# Patient Record
Sex: Female | Born: 1945 | Race: White | Hispanic: No | Smoking: Never smoker
Health system: Southern US, Community
[De-identification: ages and names within clinical notes are randomized; demographics above are authoritative.]

## PROBLEM LIST (undated history)

## (undated) DIAGNOSIS — N289 Disorder of kidney and ureter, unspecified: Secondary | ICD-10-CM

## (undated) DIAGNOSIS — K635 Polyp of colon: Secondary | ICD-10-CM

## (undated) DIAGNOSIS — E785 Hyperlipidemia, unspecified: Secondary | ICD-10-CM

## (undated) DIAGNOSIS — I10 Essential (primary) hypertension: Secondary | ICD-10-CM

## (undated) DIAGNOSIS — C679 Malignant neoplasm of bladder, unspecified: Secondary | ICD-10-CM

## (undated) DIAGNOSIS — C801 Malignant (primary) neoplasm, unspecified: Secondary | ICD-10-CM

## (undated) DIAGNOSIS — C539 Malignant neoplasm of cervix uteri, unspecified: Secondary | ICD-10-CM

## (undated) DIAGNOSIS — F419 Anxiety disorder, unspecified: Secondary | ICD-10-CM

## (undated) HISTORY — DX: Malignant neoplasm of cervix uteri, unspecified: C53.9

## (undated) HISTORY — PX: OTHER SURGICAL HISTORY: SHX169

## (undated) HISTORY — DX: Malignant (primary) neoplasm, unspecified: C80.1

## (undated) HISTORY — DX: Hyperlipidemia, unspecified: E78.5

## (undated) HISTORY — PX: CHOLECYSTECTOMY: SHX55

## (undated) HISTORY — DX: Anxiety disorder, unspecified: F41.9

## (undated) HISTORY — DX: Polyp of colon: K63.5

## (undated) HISTORY — DX: Essential (primary) hypertension: I10

## (undated) HISTORY — DX: Malignant neoplasm of bladder, unspecified: C67.9

## (undated) HISTORY — PX: ABDOMINAL HYSTERECTOMY: SHX81

---

## 1969-04-03 DIAGNOSIS — C801 Malignant (primary) neoplasm, unspecified: Secondary | ICD-10-CM

## 1969-04-03 HISTORY — DX: Malignant (primary) neoplasm, unspecified: C80.1

## 1983-04-04 HISTORY — PX: CHOLECYSTECTOMY: SHX55

## 1984-04-03 DIAGNOSIS — C679 Malignant neoplasm of bladder, unspecified: Secondary | ICD-10-CM

## 1984-04-03 HISTORY — DX: Malignant neoplasm of bladder, unspecified: C67.9

## 1984-04-03 HISTORY — PX: TOTAL ABDOMINAL HYSTERECTOMY: SHX209

## 1984-04-03 HISTORY — PX: APPENDECTOMY: SHX54

## 2010-08-19 ENCOUNTER — Encounter: Payer: Self-pay | Admitting: Nurse Practitioner

## 2011-08-08 DIAGNOSIS — D1800 Hemangioma unspecified site: Secondary | ICD-10-CM | POA: Diagnosis not present

## 2011-08-08 DIAGNOSIS — L820 Inflamed seborrheic keratosis: Secondary | ICD-10-CM | POA: Diagnosis not present

## 2011-08-08 DIAGNOSIS — L570 Actinic keratosis: Secondary | ICD-10-CM | POA: Diagnosis not present

## 2011-08-08 DIAGNOSIS — L821 Other seborrheic keratosis: Secondary | ICD-10-CM | POA: Diagnosis not present

## 2011-08-08 DIAGNOSIS — D485 Neoplasm of uncertain behavior of skin: Secondary | ICD-10-CM | POA: Diagnosis not present

## 2011-08-11 DIAGNOSIS — I10 Essential (primary) hypertension: Secondary | ICD-10-CM | POA: Diagnosis not present

## 2011-08-21 DIAGNOSIS — Z8544 Personal history of malignant neoplasm of other female genital organs: Secondary | ICD-10-CM | POA: Diagnosis not present

## 2011-08-21 DIAGNOSIS — Z09 Encounter for follow-up examination after completed treatment for conditions other than malignant neoplasm: Secondary | ICD-10-CM | POA: Diagnosis not present

## 2011-08-21 DIAGNOSIS — C52 Malignant neoplasm of vagina: Secondary | ICD-10-CM | POA: Diagnosis not present

## 2011-09-06 DIAGNOSIS — M949 Disorder of cartilage, unspecified: Secondary | ICD-10-CM | POA: Diagnosis not present

## 2011-09-06 DIAGNOSIS — M899 Disorder of bone, unspecified: Secondary | ICD-10-CM | POA: Diagnosis not present

## 2011-12-27 DIAGNOSIS — Z1231 Encounter for screening mammogram for malignant neoplasm of breast: Secondary | ICD-10-CM | POA: Diagnosis not present

## 2012-01-08 DIAGNOSIS — Z09 Encounter for follow-up examination after completed treatment for conditions other than malignant neoplasm: Secondary | ICD-10-CM | POA: Diagnosis not present

## 2012-01-08 DIAGNOSIS — Z86010 Personal history of colonic polyps: Secondary | ICD-10-CM | POA: Diagnosis not present

## 2012-01-08 DIAGNOSIS — Z8544 Personal history of malignant neoplasm of other female genital organs: Secondary | ICD-10-CM | POA: Diagnosis not present

## 2012-01-08 DIAGNOSIS — N829 Female genital tract fistula, unspecified: Secondary | ICD-10-CM | POA: Diagnosis not present

## 2012-01-11 DIAGNOSIS — Q519 Congenital malformation of uterus and cervix, unspecified: Secondary | ICD-10-CM | POA: Diagnosis not present

## 2012-01-11 DIAGNOSIS — C52 Malignant neoplasm of vagina: Secondary | ICD-10-CM | POA: Diagnosis not present

## 2012-01-11 DIAGNOSIS — N824 Other female intestinal-genital tract fistulae: Secondary | ICD-10-CM | POA: Diagnosis not present

## 2012-01-26 DIAGNOSIS — N824 Other female intestinal-genital tract fistulae: Secondary | ICD-10-CM | POA: Diagnosis not present

## 2012-01-26 DIAGNOSIS — C52 Malignant neoplasm of vagina: Secondary | ICD-10-CM | POA: Diagnosis not present

## 2012-01-28 DIAGNOSIS — C52 Malignant neoplasm of vagina: Secondary | ICD-10-CM | POA: Insufficient documentation

## 2012-01-28 DIAGNOSIS — N823 Fistula of vagina to large intestine: Secondary | ICD-10-CM | POA: Insufficient documentation

## 2012-02-01 DIAGNOSIS — N824 Other female intestinal-genital tract fistulae: Secondary | ICD-10-CM | POA: Diagnosis not present

## 2012-02-01 DIAGNOSIS — Z01818 Encounter for other preprocedural examination: Secondary | ICD-10-CM | POA: Diagnosis not present

## 2012-02-01 DIAGNOSIS — Z8544 Personal history of malignant neoplasm of other female genital organs: Secondary | ICD-10-CM | POA: Diagnosis not present

## 2012-02-01 DIAGNOSIS — K660 Peritoneal adhesions (postprocedural) (postinfection): Secondary | ICD-10-CM | POA: Diagnosis not present

## 2012-02-01 DIAGNOSIS — Z8249 Family history of ischemic heart disease and other diseases of the circulatory system: Secondary | ICD-10-CM | POA: Diagnosis not present

## 2012-02-01 DIAGNOSIS — R159 Full incontinence of feces: Secondary | ICD-10-CM | POA: Diagnosis not present

## 2012-02-01 DIAGNOSIS — Z91013 Allergy to seafood: Secondary | ICD-10-CM | POA: Diagnosis not present

## 2012-02-01 DIAGNOSIS — Z833 Family history of diabetes mellitus: Secondary | ICD-10-CM | POA: Diagnosis not present

## 2012-02-01 DIAGNOSIS — I10 Essential (primary) hypertension: Secondary | ICD-10-CM | POA: Diagnosis not present

## 2012-02-01 DIAGNOSIS — Z79899 Other long term (current) drug therapy: Secondary | ICD-10-CM | POA: Diagnosis not present

## 2012-02-01 DIAGNOSIS — Z8489 Family history of other specified conditions: Secondary | ICD-10-CM | POA: Diagnosis not present

## 2012-02-01 DIAGNOSIS — Z91041 Radiographic dye allergy status: Secondary | ICD-10-CM | POA: Diagnosis not present

## 2012-02-01 DIAGNOSIS — Z823 Family history of stroke: Secondary | ICD-10-CM | POA: Diagnosis not present

## 2012-02-20 DIAGNOSIS — Z433 Encounter for attention to colostomy: Secondary | ICD-10-CM | POA: Diagnosis not present

## 2012-03-04 DIAGNOSIS — N829 Female genital tract fistula, unspecified: Secondary | ICD-10-CM | POA: Diagnosis not present

## 2012-03-04 DIAGNOSIS — Z8544 Personal history of malignant neoplasm of other female genital organs: Secondary | ICD-10-CM | POA: Diagnosis not present

## 2012-03-04 DIAGNOSIS — Z09 Encounter for follow-up examination after completed treatment for conditions other than malignant neoplasm: Secondary | ICD-10-CM | POA: Diagnosis not present

## 2012-03-06 DIAGNOSIS — Z433 Encounter for attention to colostomy: Secondary | ICD-10-CM | POA: Diagnosis not present

## 2012-03-14 DIAGNOSIS — Z23 Encounter for immunization: Secondary | ICD-10-CM | POA: Diagnosis not present

## 2012-04-05 DIAGNOSIS — E785 Hyperlipidemia, unspecified: Secondary | ICD-10-CM | POA: Diagnosis not present

## 2012-04-05 DIAGNOSIS — E559 Vitamin D deficiency, unspecified: Secondary | ICD-10-CM | POA: Diagnosis not present

## 2012-04-05 DIAGNOSIS — I10 Essential (primary) hypertension: Secondary | ICD-10-CM | POA: Diagnosis not present

## 2012-04-10 DIAGNOSIS — Z433 Encounter for attention to colostomy: Secondary | ICD-10-CM | POA: Diagnosis not present

## 2012-05-13 DIAGNOSIS — C52 Malignant neoplasm of vagina: Secondary | ICD-10-CM | POA: Diagnosis not present

## 2012-05-13 DIAGNOSIS — N824 Other female intestinal-genital tract fistulae: Secondary | ICD-10-CM | POA: Diagnosis not present

## 2012-05-15 DIAGNOSIS — H43399 Other vitreous opacities, unspecified eye: Secondary | ICD-10-CM | POA: Diagnosis not present

## 2012-05-22 DIAGNOSIS — Z09 Encounter for follow-up examination after completed treatment for conditions other than malignant neoplasm: Secondary | ICD-10-CM | POA: Diagnosis not present

## 2012-05-22 DIAGNOSIS — Z8544 Personal history of malignant neoplasm of other female genital organs: Secondary | ICD-10-CM | POA: Diagnosis not present

## 2012-05-22 DIAGNOSIS — Z433 Encounter for attention to colostomy: Secondary | ICD-10-CM | POA: Diagnosis not present

## 2012-07-08 ENCOUNTER — Ambulatory Visit (INDEPENDENT_AMBULATORY_CARE_PROVIDER_SITE_OTHER): Payer: Medicare Other | Admitting: Nurse Practitioner

## 2012-07-08 ENCOUNTER — Encounter: Payer: Self-pay | Admitting: Nurse Practitioner

## 2012-07-08 VITALS — BP 128/78 | HR 68 | Temp 96.8°F | Ht 64.5 in | Wt 140.5 lb

## 2012-07-08 DIAGNOSIS — E785 Hyperlipidemia, unspecified: Secondary | ICD-10-CM | POA: Insufficient documentation

## 2012-07-08 DIAGNOSIS — I10 Essential (primary) hypertension: Secondary | ICD-10-CM | POA: Insufficient documentation

## 2012-07-08 LAB — COMPLETE METABOLIC PANEL WITH GFR
Alkaline Phosphatase: 75 U/L (ref 39–117)
Creat: 0.82 mg/dL (ref 0.50–1.10)
GFR, Est African American: 86 mL/min
GFR, Est Non African American: 74 mL/min
Glucose, Bld: 90 mg/dL (ref 70–99)
Sodium: 138 mEq/L (ref 135–145)
Total Bilirubin: 0.5 mg/dL (ref 0.3–1.2)
Total Protein: 7.8 g/dL (ref 6.0–8.3)

## 2012-07-08 MED ORDER — ENALAPRIL MALEATE 10 MG PO TABS: 10.0000 mg | ORAL_TABLET | Freq: Every day | ORAL | Status: DC

## 2012-07-08 NOTE — Patient Instructions (Signed)

## 2012-07-08 NOTE — Progress Notes (Signed)
Subjective:     Sheri Russell is a 67 y.o. female.  Chief Complaint: Dyslipidemia Patient presents for evaluation of lipids. Compliance with treatment thus far has been inadequate. Patients last labs were good so patient stopped taking zocor. A repeat fasting lipid profile was done. The patient does not use medications that may worsen dyslipidemias (corticosteroids, progestins, anabolic steroids, diuretics, beta-blockers, amiodarone, cyclosporine, olanzapine). The patient exercises daily.  The patient is known to have coexisting coronary artery disease.   Cardiac Risk Factors Age > 45-female, > 55-female:  YES  +1  Smoking:   NO  Sig. family hx of CHD*:  YES  +1  Hypertension:   YES  +1  Diabetes:   NO  HDL < 35:   NO  HDL > 59:   YES  -1  Total: 2  *- Significant family history of Coronary Heart Disease per National Cholesterol Education Program = Myocardial Infarction or sudden death at less than 69 years old in  father or other 1st-degree female relative, or less than 45 years old in mother or  other 1st-degree female relative.  Hypertension Patient is here for follow-up of elevated blood pressure. She is exercising and is adherent to a low-salt diet. Blood pressure is well controlled at home. Cardiac symptoms: none. Patient denies chest pain, dyspnea, fatigue, near-syncope and palpitations. Cardiovascular risk factors: advanced age (older than 65 for men, 26 for women). Use of agents associated with hypertension: none. History of target organ damage: none.   The following portions of the patient's history were reviewed and updated as appropriate: allergies, current medications, past family history, past medical history, past social history, past surgical history and problem list.  Review of Systems A comprehensive review of systems was negative.    Objective:    BP 128/78  Pulse 68  Temp(Src) 96.8 F (36 C) (Oral)  Ht 5' 4.5" (1.638 m)  Wt 140 lb 8 oz (63.73 kg)  BMI 23.75 kg/m2   LMP 07/09/1975 BP 128/78  Pulse 68  Temp(Src) 96.8 F (36 C) (Oral)  Ht 5' 4.5" (1.638 m)  Wt 140 lb 8 oz (63.73 kg)  BMI 23.75 kg/m2  LMP 07/09/1975  General Appearance:    Alert, cooperative, no distress, appears stated age  Head:    Normocephalic, without obvious abnormality, atraumatic  Eyes:    PERRL, conjunctiva/corneas clear, EOM's intact, fundi    benign, both eyes  Ears:    Normal TM's and external ear canals, both ears  Nose:   Nares normal, septum midline, mucosa normal, no drainage    or sinus tenderness  Throat:   Lips, mucosa, and tongue normal; teeth and gums normal  Neck:   Supple, symmetrical, trachea midline, no adenopathy;    thyroid:  no enlargement/tenderness/nodules; no carotid   bruit or JVD  Back:     Symmetric, no curvature, ROM normal, no CVA tenderness  Lungs:     Clear to auscultation bilaterally, respirations unlabored  Chest Wall:    No tenderness or deformity   Heart:    Regular rate and rhythm, S1 and S2 normal, no murmur, rub   or gallop  Breast Exam:    No tenderness, masses, or nipple abnormality  Abdomen:     Soft, non-tender, bowel sounds active all four quadrants,    no masses, no organomegaly  Genitalia:    Normal female without lesion, discharge or tenderness  Rectal:    Normal tone, normal prostate, no masses or tenderness;   guaiac negative  stool  Extremities:   Extremities normal, atraumatic, no cyanosis or edema  Pulses:   2+ and symmetric all extremities  Skin:   Skin color, texture, turgor normal, no rashes or lesions  Lymph nodes:   Cervical, supraclavicular, and axillary nodes normal  Neurologic:   CNII-XII intact, normal strength, sensation and reflexes    throughout      Assessment:     1. Essential hypertension, benign   2. Hyperlipidemia with target LDL less than 100         Plan:     Low fat diet and exercise Labs pending Continue current med Will discuss Cholesterol and wether need to go back on Zocor F/U in 3  months  Mary-Margaret Daphine Deutscher, FNP

## 2012-07-09 ENCOUNTER — Telehealth: Payer: Self-pay | Admitting: Nurse Practitioner

## 2012-07-09 ENCOUNTER — Telehealth: Payer: Self-pay | Admitting: *Deleted

## 2012-07-09 LAB — NMR LIPOPROFILE WITH LIPIDS
HDL Size: 9.4 nm (ref >=9.2)
HDL-C: 67 mg/dL (ref >=40)
LDL (calc): 95 mg/dL (ref <100)
LDL Particle Number: 1094 nmol/L — ABNORMAL HIGH (ref <1000)
LDL Size: 20.7 nm (ref >20.5)
LP-IR Score: 49 — ABNORMAL HIGH (ref <=45)
Large HDL-P: 10.6 umol/L (ref >=4.8)
Triglycerides: 152 mg/dL — ABNORMAL HIGH (ref <150)
VLDL Size: 49.2 nm — ABNORMAL HIGH (ref <=46.6)

## 2012-07-09 NOTE — Telephone Encounter (Signed)
Message copied by Magdalene River on Tue Jul 09, 2012  2:08 PM ------      Message from: Bennie Pierini      Created: Tue Jul 09, 2012  1:43 PM       All labs look good. Continue current meds and recheck in 3-6 months ------

## 2012-07-10 NOTE — Telephone Encounter (Signed)
Pt was calling about lab results and pt was notified

## 2012-08-07 DIAGNOSIS — D485 Neoplasm of uncertain behavior of skin: Secondary | ICD-10-CM | POA: Diagnosis not present

## 2012-08-07 DIAGNOSIS — D1800 Hemangioma unspecified site: Secondary | ICD-10-CM | POA: Diagnosis not present

## 2012-08-07 DIAGNOSIS — D235 Other benign neoplasm of skin of trunk: Secondary | ICD-10-CM | POA: Diagnosis not present

## 2012-08-07 DIAGNOSIS — L570 Actinic keratosis: Secondary | ICD-10-CM | POA: Diagnosis not present

## 2012-08-07 DIAGNOSIS — L820 Inflamed seborrheic keratosis: Secondary | ICD-10-CM | POA: Diagnosis not present

## 2012-09-02 DIAGNOSIS — Z8544 Personal history of malignant neoplasm of other female genital organs: Secondary | ICD-10-CM | POA: Diagnosis not present

## 2012-09-02 DIAGNOSIS — Z09 Encounter for follow-up examination after completed treatment for conditions other than malignant neoplasm: Secondary | ICD-10-CM | POA: Diagnosis not present

## 2012-09-02 DIAGNOSIS — Z933 Colostomy status: Secondary | ICD-10-CM | POA: Diagnosis not present

## 2012-11-06 ENCOUNTER — Other Ambulatory Visit: Payer: Self-pay

## 2012-11-11 DIAGNOSIS — H2510 Age-related nuclear cataract, unspecified eye: Secondary | ICD-10-CM | POA: Diagnosis not present

## 2012-12-31 DIAGNOSIS — Z1231 Encounter for screening mammogram for malignant neoplasm of breast: Secondary | ICD-10-CM | POA: Diagnosis not present

## 2013-01-20 ENCOUNTER — Ambulatory Visit (INDEPENDENT_AMBULATORY_CARE_PROVIDER_SITE_OTHER): Payer: Medicare Other

## 2013-01-20 DIAGNOSIS — Z23 Encounter for immunization: Secondary | ICD-10-CM | POA: Diagnosis not present

## 2013-01-22 ENCOUNTER — Ambulatory Visit: Payer: Medicare Other

## 2013-04-18 ENCOUNTER — Encounter: Payer: Self-pay | Admitting: Nurse Practitioner

## 2013-04-18 ENCOUNTER — Ambulatory Visit (INDEPENDENT_AMBULATORY_CARE_PROVIDER_SITE_OTHER): Payer: Medicare Other | Admitting: Nurse Practitioner

## 2013-04-18 VITALS — BP 161/82 | HR 90 | Temp 97.0°F | Ht 64.5 in | Wt 144.0 lb

## 2013-04-18 DIAGNOSIS — I10 Essential (primary) hypertension: Secondary | ICD-10-CM | POA: Diagnosis not present

## 2013-04-18 DIAGNOSIS — E785 Hyperlipidemia, unspecified: Secondary | ICD-10-CM

## 2013-04-18 MED ORDER — ENALAPRIL MALEATE 20 MG PO TABS: 20.0000 mg | ORAL_TABLET | Freq: Every day | ORAL | Status: DC

## 2013-04-18 NOTE — Patient Instructions (Signed)
Hypertriglyceridemia  Diet for High blood levels of Triglycerides Most fats in food are triglycerides. Triglycerides in your blood are stored as fat in your body. High levels of triglycerides in your blood may put you at a greater risk for heart disease and stroke.  Normal triglyceride levels are less than 150 mg/dL. Borderline high levels are 150-199 mg/dl. High levels are 200 - 499 mg/dL, and very high triglyceride levels are greater than 500 mg/dL. The decision to treat high triglycerides is generally based on the level. For people with borderline or high triglyceride levels, treatment includes weight loss and exercise. Drugs are recommended for people with very high triglyceride levels. Many people who need treatment for high triglyceride levels have metabolic syndrome. This syndrome is a collection of disorders that often include: insulin resistance, high blood pressure, blood clotting problems, high cholesterol and triglycerides. TESTING PROCEDURE FOR TRIGLYCERIDES  You should not eat 4 hours before getting your triglycerides measured. The normal range of triglycerides is between 10 and 250 milligrams per deciliter (mg/dl). Some people may have extreme levels (1000 or above), but your triglyceride level may be too high if it is above 150 mg/dl, depending on what other risk factors you have for heart disease.  People with high blood triglycerides may also have high blood cholesterol levels. If you have high blood cholesterol as well as high blood triglycerides, your risk for heart disease is probably greater than if you only had high triglycerides. High blood cholesterol is one of the main risk factors for heart disease. CHANGING YOUR DIET  Your weight can affect your blood triglyceride level. If you are more than 20% above your ideal body weight, you may be able to lower your blood triglycerides by losing weight. Eating less and exercising regularly is the best way to combat this. Fat provides more  calories than any other food. The best way to lose weight is to eat less fat. Only 30% of your total calories should come from fat. Less than 7% of your diet should come from saturated fat. A diet low in fat and saturated fat is the same as a diet to decrease blood cholesterol. By eating a diet lower in fat, you may lose weight, lower your blood cholesterol, and lower your blood triglyceride level.  Eating a diet low in fat, especially saturated fat, may also help you lower your blood triglyceride level. Ask your dietitian to help you figure how much fat you can eat based on the number of calories your caregiver has prescribed for you.  Exercise, in addition to helping with weight loss may also help lower triglyceride levels.   Alcohol can increase blood triglycerides. You may need to stop drinking alcoholic beverages.  Too much carbohydrate in your diet may also increase your blood triglycerides. Some complex carbohydrates are necessary in your diet. These may include bread, rice, potatoes, other starchy vegetables and cereals.  Reduce "simple" carbohydrates. These may include pure sugars, candy, honey, and jelly without losing other nutrients. If you have the kind of high blood triglycerides that is affected by the amount of carbohydrates in your diet, you will need to eat less sugar and less high-sugar foods. Your caregiver can help you with this.  Adding 2-4 grams of fish oil (EPA+ DHA) may also help lower triglycerides. Speak with your caregiver before adding any supplements to your regimen. Following the Diet  Maintain your ideal weight. Your caregivers can help you with a diet. Generally, eating less food and getting more   exercise will help you lose weight. Joining a weight control group may also help. Ask your caregivers for a good weight control group in your area.  Eat low-fat foods instead of high-fat foods. This can help you lose weight too.  These foods are lower in fat. Eat MORE of these:    Dried beans, peas, and lentils.  Egg whites.  Low-fat cottage cheese.  Fish.  Lean cuts of meat, such as round, sirloin, rump, and flank (cut extra fat off meat you fix).  Whole grain breads, cereals and pasta.  Skim and nonfat dry milk.  Low-fat yogurt.  Poultry without the skin.  Cheese made with skim or part-skim milk, such as mozzarella, parmesan, farmers', ricotta, or pot cheese. These are higher fat foods. Eat LESS of these:   Whole milk and foods made from whole milk, such as American, blue, cheddar, monterey jack, and swiss cheese  High-fat meats, such as luncheon meats, sausages, knockwurst, bratwurst, hot dogs, ribs, corned beef, ground pork, and regular ground beef.  Fried foods. Limit saturated fats in your diet. Substituting unsaturated fat for saturated fat may decrease your blood triglyceride level. You will need to read package labels to know which products contain saturated fats.  These foods are high in saturated fat. Eat LESS of these:   Fried pork skins.  Whole milk.  Skin and fat from poultry.  Palm oil.  Butter.  Shortening.  Cream cheese.  Bacon.  Margarines and baked goods made from listed oils.  Vegetable shortenings.  Chitterlings.  Fat from meats.  Coconut oil.  Palm kernel oil.  Lard.  Cream.  Sour cream.  Fatback.  Coffee whiteners and non-dairy creamers made with these oils.  Cheese made from whole milk. Use unsaturated fats (both polyunsaturated and monounsaturated) moderately. Remember, even though unsaturated fats are better than saturated fats; you still want a diet low in total fat.  These foods are high in unsaturated fat:   Canola oil.  Sunflower oil.  Mayonnaise.  Almonds.  Peanuts.  Pine nuts.  Margarines made with these oils.  Safflower oil.  Olive oil.  Avocados.  Cashews.  Peanut butter.  Sunflower seeds.  Soybean oil.  Peanut  oil.  Olives.  Pecans.  Walnuts.  Pumpkin seeds. Avoid sugar and other high-sugar foods. This will decrease carbohydrates without decreasing other nutrients. Sugar in your food goes rapidly to your blood. When there is excess sugar in your blood, your liver may use it to make more triglycerides. Sugar also contains calories without other important nutrients.  Eat LESS of these:   Sugar, brown sugar, powdered sugar, jam, jelly, preserves, honey, syrup, molasses, pies, candy, cakes, cookies, frosting, pastries, colas, soft drinks, punches, fruit drinks, and regular gelatin.  Avoid alcohol. Alcohol, even more than sugar, may increase blood triglycerides. In addition, alcohol is high in calories and low in nutrients. Ask for sparkling water, or a diet soft drink instead of an alcoholic beverage. Suggestions for planning and preparing meals   Bake, broil, grill or roast meats instead of frying.  Remove fat from meats and skin from poultry before cooking.  Add spices, herbs, lemon juice or vinegar to vegetables instead of salt, rich sauces or gravies.  Use a non-stick skillet without fat or use no-stick sprays.  Cool and refrigerate stews and broth. Then remove the hardened fat floating on the surface before serving.  Refrigerate meat drippings and skim off fat to make low-fat gravies.  Serve more fish.  Use less butter,   margarine and other high-fat spreads on bread or vegetables.  Use skim or reconstituted non-fat dry milk for cooking.  Cook with low-fat cheeses.  Substitute low-fat yogurt or cottage cheese for all or part of the sour cream in recipes for sauces, dips or congealed salads.  Use half yogurt/half mayonnaise in salad recipes.  Substitute evaporated skim milk for cream. Evaporated skim milk or reconstituted non-fat dry milk can be whipped and substituted for whipped cream in certain recipes.  Choose fresh fruits for dessert instead of high-fat foods such as pies or  cakes. Fruits are naturally low in fat. When Dining Out   Order low-fat appetizers such as fruit or vegetable juice, pasta with vegetables or tomato sauce.  Select clear, rather than cream soups.  Ask that dressings and gravies be served on the side. Then use less of them.  Order foods that are baked, broiled, poached, steamed, stir-fried, or roasted.  Ask for margarine instead of butter, and use only a small amount.  Drink sparkling water, unsweetened tea or coffee, or diet soft drinks instead of alcohol or other sweet beverages. QUESTIONS AND ANSWERS ABOUT OTHER FATS IN THE BLOOD: SATURATED FAT, TRANS FAT, AND CHOLESTEROL What is trans fat? Trans fat is a type of fat that is formed when vegetable oil is hardened through a process called hydrogenation. This process helps makes foods more solid, gives them shape, and prolongs their shelf life. Trans fats are also called hydrogenated or partially hydrogenated oils.  What do saturated fat, trans fat, and cholesterol in foods have to do with heart disease? Saturated fat, trans fat, and cholesterol in the diet all raise the level of LDL "bad" cholesterol in the blood. The higher the LDL cholesterol, the greater the risk for coronary heart disease (CHD). Saturated fat and trans fat raise LDL similarly.  What foods contain saturated fat, trans fat, and cholesterol? High amounts of saturated fat are found in animal products, such as fatty cuts of meat, chicken skin, and full-fat dairy products like butter, whole milk, cream, and cheese, and in tropical vegetable oils such as palm, palm kernel, and coconut oil. Trans fat is found in some of the same foods as saturated fat, such as vegetable shortening, some margarines (especially hard or stick margarine), crackers, cookies, baked goods, fried foods, salad dressings, and other processed foods made with partially hydrogenated vegetable oils. Small amounts of trans fat also occur naturally in some animal  products, such as milk products, beef, and lamb. Foods high in cholesterol include liver, other organ meats, egg yolks, shrimp, and full-fat dairy products. How can I use the new food label to make heart-healthy food choices? Check the Nutrition Facts panel of the food label. Choose foods lower in saturated fat, trans fat, and cholesterol. For saturated fat and cholesterol, you can also use the Percent Daily Value (%DV): 5% DV or less is low, and 20% DV or more is high. (There is no %DV for trans fat.) Use the Nutrition Facts panel to choose foods low in saturated fat and cholesterol, and if the trans fat is not listed, read the ingredients and limit products that list shortening or hydrogenated or partially hydrogenated vegetable oil, which tend to be high in trans fat. POINTS TO REMEMBER:   Discuss your risk for heart disease with your caregivers, and take steps to reduce risk factors.  Change your diet. Choose foods that are low in saturated fat, trans fat, and cholesterol.  Add exercise to your daily routine if   it is not already being done. Participate in physical activity of moderate intensity, like brisk walking, for at least 30 minutes on most, and preferably all days of the week. No time? Break the 30 minutes into three, 10-minute segments during the day.  Stop smoking. If you do smoke, contact your caregiver to discuss ways in which they can help you quit.  Do not use street drugs.  Maintain a normal weight.  Maintain a healthy blood pressure.  Keep up with your blood work for checking the fats in your blood as directed by your caregiver. Document Released: 01/06/2004 Document Revised: 09/19/2011 Document Reviewed: 08/03/2008 [employer] Patient Information 2014 [employer].  

## 2013-04-18 NOTE — Progress Notes (Signed)
   Subjective:    Patient ID: Sheri Russell, female    DOB: 1946-02-27, 68 y.o.   MRN: [id number]  Patient here today for follow up- doing well- no changes since last visit.  Hypertension This is a chronic problem. The current episode started in the past 7 days. The problem is unchanged. The problem is uncontrolled. Pertinent negatives include no blurred vision, chest pain, headaches, neck pain, palpitations, peripheral edema or shortness of breath. Risk factors for coronary artery disease include dyslipidemia, family history and post-menopausal state. Past treatments include ACE inhibitors. The current treatment provides moderate improvement. Compliance problems include exercise.   Hyperlipidemia This is a chronic problem. The current episode started more than 1 year ago. The problem is uncontrolled. Recent lipid tests were reviewed and are high. She has no history of diabetes, hypothyroidism or obesity. Pertinent negatives include no chest pain or shortness of breath. Treatments tried: refuses medicine. The current treatment provides moderate improvement of lipids. Compliance problems include adherence to diet and adherence to exercise.  Risk factors for coronary artery disease include dyslipidemia, hypertension and post-menopausal.      Review of Systems  Eyes: Negative for blurred vision.  Respiratory: Negative for shortness of breath.   Cardiovascular: Negative for chest pain and palpitations.  Musculoskeletal: Negative for neck pain.  Neurological: Negative for headaches.       Objective:   Physical Exam  Constitutional: She is oriented to person, place, and time. She appears well-developed and well-nourished.  HENT:  Nose: Nose normal.  Mouth/Throat: Oropharynx is clear and moist.  Eyes: EOM are normal.  Neck: Trachea normal, normal range of motion and full passive range of motion without pain. Neck supple. No JVD present. Carotid bruit is not present. No thyromegaly present.    Cardiovascular: Normal rate, regular rhythm, normal heart sounds and intact distal pulses.  Exam reveals no gallop and no friction rub.   No murmur heard. Pulmonary/Chest: Effort normal and breath sounds normal.  Abdominal: Soft. Bowel sounds are normal. She exhibits no distension and no mass. There is no tenderness.  colonostomy looks good- no sign of infection  Musculoskeletal: Normal range of motion.  Lymphadenopathy:    She has no cervical adenopathy.  Neurological: She is alert and oriented to person, place, and time. She has normal reflexes.  Skin: Skin is warm and dry.  Psychiatric: She has a normal mood and affect. Her behavior is normal. Judgment and thought content normal.    BP 161/82  Pulse 90  Temp(Src) 97 F (36.1 C) (Oral)  Ht 5' 4.5" (1.638 m)  Wt 144 lb (65.318 kg)  BMI 24.34 kg/m2       Assessment & Plan:   1. Essential hypertension, benign   2. Hyperlipidemia with target LDL less than 100    Orders Placed This Encounter  Procedures  . CMP14+EGFR  . NMR, lipoprofile   Meds ordered this encounter  Medications  . enalapril (VASOTEC) 20 MG tablet    Sig: Take 1 tablet (20 mg total) by mouth daily.    Dispense:  30 tablet    Refill:  5    Order Specific Question:  Supervising Provider    Answer:  Chipper Herb [[id number]]    Labs pending Health maintenance reviewed Diet and exercise encouraged Continue all meds Follow up  In 3 months   [place], FNP

## 2013-04-20 LAB — NMR, LIPOPROFILE
Cholesterol: 189 mg/dL (ref <200)
HDL CHOLESTEROL BY NMR: 69 mg/dL (ref >=40)
HDL Particle Number: 40.2 umol/L (ref >=30.5)
LDL Particle Number: 1379 nmol/L — ABNORMAL HIGH (ref <1000)
LDL Size: 21.2 nm (ref >20.5)
LDLC SERPL CALC-MCNC: 99 mg/dL (ref <100)
LP-IR Score: 43 (ref <=45)
Small LDL Particle Number: 443 nmol/L (ref <=527)
TRIGLYCERIDES BY NMR: 107 mg/dL (ref <150)

## 2013-04-20 LAB — CMP14+EGFR
A/G RATIO: 1.7 (ref 1.1–2.5)
ALK PHOS: 90 IU/L (ref 39–117)
ALT: 36 IU/L — ABNORMAL HIGH (ref 0–32)
AST: 25 IU/L (ref 0–40)
Albumin: 4.8 g/dL (ref 3.6–4.8)
BILIRUBIN TOTAL: 0.4 mg/dL (ref 0.0–1.2)
BUN / CREAT RATIO: 10 — AB (ref 11–26)
BUN: 9 mg/dL (ref 8–27)
CO2: 25 mmol/L (ref 18–29)
CREATININE: 0.94 mg/dL (ref 0.57–1.00)
Calcium: 9.8 mg/dL (ref 8.6–10.2)
Chloride: 101 mmol/L (ref 97–108)
GFR, EST AFRICAN AMERICAN: 73 mL/min/{1.73_m2} (ref >59)
GFR, EST NON AFRICAN AMERICAN: 63 mL/min/{1.73_m2} (ref >59)
GLOBULIN, TOTAL: 2.8 g/dL (ref 1.5–4.5)
Glucose: 93 mg/dL (ref 65–99)
Potassium: 4.6 mmol/L (ref 3.5–5.2)
SODIUM: 142 mmol/L (ref 134–144)
Total Protein: 7.6 g/dL (ref 6.0–8.5)

## 2013-05-01 ENCOUNTER — Telehealth: Payer: Self-pay | Admitting: Nurse Practitioner

## 2013-05-02 NOTE — Telephone Encounter (Signed)
Left message to call back  

## 2013-05-02 NOTE — Telephone Encounter (Signed)
Copy put up front for patient to come pick up today

## 2013-05-07 DIAGNOSIS — Z90711 Acquired absence of uterus with remaining cervical stump: Secondary | ICD-10-CM | POA: Diagnosis not present

## 2013-05-07 DIAGNOSIS — K649 Unspecified hemorrhoids: Secondary | ICD-10-CM | POA: Diagnosis not present

## 2013-05-07 DIAGNOSIS — Z91041 Radiographic dye allergy status: Secondary | ICD-10-CM | POA: Diagnosis not present

## 2013-05-07 DIAGNOSIS — Z1289 Encounter for screening for malignant neoplasm of other sites: Secondary | ICD-10-CM | POA: Diagnosis not present

## 2013-05-07 DIAGNOSIS — Z823 Family history of stroke: Secondary | ICD-10-CM | POA: Diagnosis not present

## 2013-05-07 DIAGNOSIS — C52 Malignant neoplasm of vagina: Secondary | ICD-10-CM | POA: Diagnosis not present

## 2013-05-07 DIAGNOSIS — Z79899 Other long term (current) drug therapy: Secondary | ICD-10-CM | POA: Diagnosis not present

## 2013-05-07 DIAGNOSIS — Z86010 Personal history of colonic polyps: Secondary | ICD-10-CM | POA: Diagnosis not present

## 2013-05-07 DIAGNOSIS — Z91013 Allergy to seafood: Secondary | ICD-10-CM | POA: Diagnosis not present

## 2013-05-07 DIAGNOSIS — Z833 Family history of diabetes mellitus: Secondary | ICD-10-CM | POA: Diagnosis not present

## 2013-05-07 DIAGNOSIS — I10 Essential (primary) hypertension: Secondary | ICD-10-CM | POA: Diagnosis not present

## 2013-05-07 DIAGNOSIS — Z9089 Acquired absence of other organs: Secondary | ICD-10-CM | POA: Diagnosis not present

## 2013-05-07 DIAGNOSIS — Z8249 Family history of ischemic heart disease and other diseases of the circulatory system: Secondary | ICD-10-CM | POA: Diagnosis not present

## 2013-05-07 DIAGNOSIS — Z923 Personal history of irradiation: Secondary | ICD-10-CM | POA: Diagnosis not present

## 2013-08-23 DIAGNOSIS — J069 Acute upper respiratory infection, unspecified: Secondary | ICD-10-CM | POA: Diagnosis not present

## 2013-08-23 DIAGNOSIS — R059 Cough, unspecified: Secondary | ICD-10-CM | POA: Diagnosis not present

## 2013-08-23 DIAGNOSIS — R05 Cough: Secondary | ICD-10-CM | POA: Diagnosis not present

## 2013-10-26 ENCOUNTER — Other Ambulatory Visit: Payer: Self-pay | Admitting: Nurse Practitioner

## 2013-10-31 ENCOUNTER — Other Ambulatory Visit: Payer: Self-pay | Admitting: Nurse Practitioner

## 2013-10-31 ENCOUNTER — Ambulatory Visit (INDEPENDENT_AMBULATORY_CARE_PROVIDER_SITE_OTHER): Payer: Medicare Other | Admitting: Nurse Practitioner

## 2013-10-31 ENCOUNTER — Encounter: Payer: Self-pay | Admitting: Nurse Practitioner

## 2013-10-31 VITALS — BP 129/78 | HR 80 | Temp 96.5°F | Ht 64.0 in | Wt 144.0 lb

## 2013-10-31 DIAGNOSIS — J3089 Other allergic rhinitis: Secondary | ICD-10-CM

## 2013-10-31 DIAGNOSIS — I10 Essential (primary) hypertension: Secondary | ICD-10-CM | POA: Diagnosis not present

## 2013-10-31 DIAGNOSIS — R3915 Urgency of urination: Secondary | ICD-10-CM | POA: Diagnosis not present

## 2013-10-31 DIAGNOSIS — J302 Other seasonal allergic rhinitis: Secondary | ICD-10-CM

## 2013-10-31 DIAGNOSIS — J309 Allergic rhinitis, unspecified: Secondary | ICD-10-CM | POA: Insufficient documentation

## 2013-10-31 DIAGNOSIS — E785 Hyperlipidemia, unspecified: Secondary | ICD-10-CM | POA: Diagnosis not present

## 2013-10-31 MED ORDER — ENALAPRIL MALEATE 20 MG PO TABS: ORAL_TABLET | ORAL | Status: DC

## 2013-10-31 NOTE — Patient Instructions (Signed)

## 2013-10-31 NOTE — Progress Notes (Signed)
Subjective:    Patient ID: Gayatri Teasdale, female    DOB: 09/06/1945, 68 y.o.   MRN: [id number]  Patient here today for follow up- doing well- no changes since last visit.  Hypertension This is a chronic problem. The current episode started in the past 7 days. The problem is unchanged. The problem is uncontrolled. Pertinent negatives include no blurred vision, chest pain, headaches, neck pain, palpitations, peripheral edema or shortness of breath. Risk factors for coronary artery disease include dyslipidemia, family history and post-menopausal state. Past treatments include ACE inhibitors. The current treatment provides moderate improvement. Compliance problems include exercise.   Hyperlipidemia This is a chronic problem. The current episode started more than 1 year ago. The problem is uncontrolled. Recent lipid tests were reviewed and are high. She has no history of diabetes, hypothyroidism or obesity. Pertinent negatives include no chest pain or shortness of breath. Treatments tried: refuses medicine. The current treatment provides moderate improvement of lipids. Compliance problems include adherence to diet and adherence to exercise.  Risk factors for coronary artery disease include dyslipidemia, hypertension and post-menopausal.  Seasonal allergies Currently on flonase and zyrtec- doig well right now. Urinary urgency Ditropan daily- keeps symptoms under control    Review of Systems  HENT: Negative.   Eyes: Negative for blurred vision.  Respiratory: Negative for shortness of breath.   Cardiovascular: Negative for chest pain and palpitations.  Musculoskeletal: Negative for neck pain.  Neurological: Negative for headaches.  All other systems reviewed and are negative.      Objective:   Physical Exam  Constitutional: She is oriented to person, place, and time. She appears well-developed and well-nourished.  HENT:  Nose: Nose normal.  Mouth/Throat: Oropharynx is clear and moist.    Eyes: EOM are normal.  Neck: Trachea normal, normal range of motion and full passive range of motion without pain. Neck supple. No JVD present. Carotid bruit is not present. No thyromegaly present.  Cardiovascular: Normal rate, regular rhythm, normal heart sounds and intact distal pulses.  Exam reveals no gallop and no friction rub.   No murmur heard. Pulmonary/Chest: Effort normal and breath sounds normal.  Abdominal: Soft. Bowel sounds are normal. She exhibits no distension and no mass. There is no tenderness.  colonostomy looks good- no sign of infection  Musculoskeletal: Normal range of motion.  Lymphadenopathy:    She has no cervical adenopathy.  Neurological: She is alert and oriented to person, place, and time. She has normal reflexes.  Skin: Skin is warm and dry.  Psychiatric: She has a normal mood and affect. Her behavior is normal. Judgment and thought content normal.    BP 129/78  Pulse 80  Temp(Src) 96.5 F (35.8 C) (Oral)  Ht 5' 4"  (1.626 m)  Wt 144 lb (65.318 kg)  BMI 24.71 kg/m2       Assessment & Plan:   1. Hyperlipidemia with target LDL less than 100   2. Essential hypertension, benign   3. Other seasonal allergic rhinitis   4. Urinary urgency    Orders Placed This Encounter  Procedures  . CMP14+EGFR  . NMR, lipoprofile   Meds ordered this encounter  Medications  . glucosamine-chondroitin 500-400 MG tablet    Sig: Take 1 tablet by mouth.  . oxybutynin (DITROPAN) 5 MG tablet    Sig:   . aspirin 81 MG tablet    Sig: Take 81 mg by mouth daily.  . fluticasone (FLONASE) 50 MCG/ACT nasal spray    Sig:   . enalapril (  VASOTEC) 20 MG tablet    Sig: TAKE ONE TABLET BY MOUTH ONCE DAILY    Dispense:  30 tablet    Refill:  5    Order Specific Question:  Supervising Provider    Answer:  Chipper Herb [[id number]]    Labs pending Health maintenance reviewed Diet and exercise encouraged Continue all meds Follow up  In 3 months   [place],  FNP

## 2013-11-01 LAB — NMR, LIPOPROFILE
Cholesterol: 180 mg/dL (ref 100–199)
HDL Cholesterol by NMR: 61 mg/dL (ref >39)
HDL PARTICLE NUMBER: 35.5 umol/L (ref >=30.5)
LDL Particle Number: 1036 nmol/L — ABNORMAL HIGH (ref <1000)
LDL SIZE: 20.9 nm (ref >20.5)
LDLC SERPL CALC-MCNC: 95 mg/dL (ref 0–99)
LP-IR SCORE: 43 (ref <=45)
SMALL LDL PARTICLE NUMBER: 223 nmol/L (ref <=527)
TRIGLYCERIDES BY NMR: 118 mg/dL (ref 0–149)

## 2013-11-03 LAB — CMP14+EGFR
A/G RATIO: 1.6 (ref 1.1–2.5)
ALBUMIN: 4.4 g/dL (ref 3.6–4.8)
ALK PHOS: 84 IU/L (ref 39–117)
ALT: 35 IU/L — ABNORMAL HIGH (ref 0–32)
AST: 30 IU/L (ref 0–40)
BUN/Creatinine Ratio: 15 (ref 11–26)
BUN: 12 mg/dL (ref 8–27)
CHLORIDE: 100 mmol/L (ref 97–108)
CO2: 25 mmol/L (ref 18–29)
Calcium: 9.6 mg/dL (ref 8.7–10.3)
Creatinine, Ser: 0.8 mg/dL (ref 0.57–1.00)
GFR calc Af Amer: 88 mL/min/{1.73_m2} (ref >59)
GFR, EST NON AFRICAN AMERICAN: 76 mL/min/{1.73_m2} (ref >59)
GLOBULIN, TOTAL: 2.8 g/dL (ref 1.5–4.5)
GLUCOSE: 85 mg/dL (ref 65–99)
Potassium: 4.1 mmol/L (ref 3.5–5.2)
Sodium: 142 mmol/L (ref 134–144)
TOTAL PROTEIN: 7.2 g/dL (ref 6.0–8.5)
Total Bilirubin: 0.3 mg/dL (ref 0.0–1.2)

## 2013-12-11 DIAGNOSIS — Z91041 Radiographic dye allergy status: Secondary | ICD-10-CM | POA: Diagnosis not present

## 2013-12-11 DIAGNOSIS — Z791 Long term (current) use of non-steroidal anti-inflammatories (NSAID): Secondary | ICD-10-CM | POA: Diagnosis not present

## 2013-12-11 DIAGNOSIS — I10 Essential (primary) hypertension: Secondary | ICD-10-CM | POA: Diagnosis not present

## 2013-12-11 DIAGNOSIS — Z923 Personal history of irradiation: Secondary | ICD-10-CM | POA: Diagnosis not present

## 2013-12-11 DIAGNOSIS — Z79899 Other long term (current) drug therapy: Secondary | ICD-10-CM | POA: Diagnosis not present

## 2013-12-11 DIAGNOSIS — C52 Malignant neoplasm of vagina: Secondary | ICD-10-CM | POA: Diagnosis not present

## 2013-12-11 DIAGNOSIS — Z8544 Personal history of malignant neoplasm of other female genital organs: Secondary | ICD-10-CM | POA: Diagnosis not present

## 2013-12-15 DIAGNOSIS — H2510 Age-related nuclear cataract, unspecified eye: Secondary | ICD-10-CM | POA: Diagnosis not present

## 2014-01-26 DIAGNOSIS — Z1231 Encounter for screening mammogram for malignant neoplasm of breast: Secondary | ICD-10-CM | POA: Diagnosis not present

## 2014-02-04 ENCOUNTER — Ambulatory Visit: Payer: Medicare Other | Admitting: Nurse Practitioner

## 2014-03-13 ENCOUNTER — Encounter: Payer: Self-pay | Admitting: Nurse Practitioner

## 2014-03-13 ENCOUNTER — Ambulatory Visit (INDEPENDENT_AMBULATORY_CARE_PROVIDER_SITE_OTHER): Payer: Medicare Other | Admitting: Nurse Practitioner

## 2014-03-13 VITALS — BP 129/81 | HR 77 | Temp 97.5°F | Ht 64.0 in | Wt 145.0 lb

## 2014-03-13 DIAGNOSIS — E785 Hyperlipidemia, unspecified: Secondary | ICD-10-CM

## 2014-03-13 DIAGNOSIS — J302 Other seasonal allergic rhinitis: Secondary | ICD-10-CM

## 2014-03-13 DIAGNOSIS — I10 Essential (primary) hypertension: Secondary | ICD-10-CM | POA: Diagnosis not present

## 2014-03-13 MED ORDER — ENALAPRIL MALEATE 20 MG PO TABS: ORAL_TABLET | ORAL | Status: DC

## 2014-03-13 NOTE — Progress Notes (Signed)
   Subjective:    Patient ID: Sheri Russell, female    DOB: [birth year], 68 y.o.   MRN: [id number]  Patient here today for follow up- doing well- no changes since last visit.  Hypertension This is a chronic problem. The current episode started more than 1 year ago. The problem is controlled. Pertinent negatives include no chest pain, headaches, neck pain, palpitations or shortness of breath. Risk factors for coronary artery disease include dyslipidemia and post-menopausal state. Past treatments include ACE inhibitors. The current treatment provides moderate improvement. Compliance problems include diet and exercise.   Hyperlipidemia This is a chronic problem. The current episode started more than 1 year ago. The problem is controlled. Recent lipid tests were reviewed and are normal. She has no history of diabetes, hypothyroidism or obesity. Pertinent negatives include no chest pain or shortness of breath. She is currently on no antihyperlipidemic treatment. The current treatment provides mild improvement of lipids. Compliance problems include adherence to diet and adherence to exercise.  Risk factors for coronary artery disease include dyslipidemia, hypertension and post-menopausal.  Seasonal allergies Currently on flonase and zyrtec- doig well right now. Urinary urgency Ditropan daily- keeps symptoms under control    Review of Systems  Constitutional: Negative.   HENT: Negative.   Respiratory: Negative for shortness of breath.   Cardiovascular: Negative for chest pain and palpitations.  Musculoskeletal: Negative for neck pain.  Neurological: Negative.  Negative for headaches.  Psychiatric/Behavioral: Negative.   All other systems reviewed and are negative.      Objective:   Physical Exam  Constitutional: She is oriented to person, place, and time. She appears well-developed and well-nourished.  HENT:  Nose: Nose normal.  Mouth/Throat: Oropharynx is clear and moist.  Eyes: EOM are  normal.  Neck: Trachea normal, normal range of motion and full passive range of motion without pain. Neck supple. No JVD present. Carotid bruit is not present. No thyromegaly present.  Cardiovascular: Normal rate, regular rhythm, normal heart sounds and intact distal pulses.  Exam reveals no gallop and no friction rub.   No murmur heard. Pulmonary/Chest: Effort normal and breath sounds normal.  Abdominal: Soft. Bowel sounds are normal. She exhibits no distension and no mass. There is no tenderness.  Musculoskeletal: Normal range of motion.  Lymphadenopathy:    She has no cervical adenopathy.  Neurological: She is alert and oriented to person, place, and time. She has normal reflexes.  Skin: Skin is warm and dry.  Psychiatric: She has a normal mood and affect. Her behavior is normal. Judgment and thought content normal.    BP 129/81 mmHg  Pulse 77  Temp(Src) 97.5 F (36.4 C) (Oral)  Ht $R'5\' 4"'km$  (1.626 m)  Wt 145 lb (65.772 kg)  BMI 24.88 kg/m2       Assessment & Plan:    1. Essential hypertension, benign Do ot add salt to diet - CMP14+EGFR - enalapril (VASOTEC) 20 MG tablet; TAKE ONE TABLET BY MOUTH ONCE DAILY  Dispense: 30 tablet; Refill: 5  2. Other seasonal allergic rhinitis Avoid allergens  3. Hyperlipidemia with target LDL less than 100 Low fat diet - NMR, lipoprofile    Labs pending Health maintenance reviewed Diet and exercise encouraged Continue all meds Follow up  In 3 month   [place], FNP

## 2014-03-13 NOTE — Patient Instructions (Signed)

## 2014-03-14 LAB — CMP14+EGFR
A/G RATIO: 1.4 (ref 1.1–2.5)
ALBUMIN: 4.2 g/dL (ref 3.6–4.8)
ALT: 29 IU/L (ref 0–32)
AST: 25 IU/L (ref 0–40)
Alkaline Phosphatase: 76 IU/L (ref 39–117)
BUN / CREAT RATIO: 14 (ref 11–26)
BUN: 12 mg/dL (ref 8–27)
CALCIUM: 9.3 mg/dL (ref 8.7–10.3)
CO2: 25 mmol/L (ref 18–29)
CREATININE: 0.87 mg/dL (ref 0.57–1.00)
Chloride: 100 mmol/L (ref 97–108)
GFR calc Af Amer: 79 mL/min/{1.73_m2} (ref >59)
GFR, EST NON AFRICAN AMERICAN: 69 mL/min/{1.73_m2} (ref >59)
GLOBULIN, TOTAL: 3.1 g/dL (ref 1.5–4.5)
Glucose: 88 mg/dL (ref 65–99)
Potassium: 3.8 mmol/L (ref 3.5–5.2)
Sodium: 142 mmol/L (ref 134–144)
Total Bilirubin: 0.3 mg/dL (ref 0.0–1.2)
Total Protein: 7.3 g/dL (ref 6.0–8.5)

## 2014-03-14 LAB — NMR, LIPOPROFILE
CHOLESTEROL: 180 mg/dL (ref 100–199)
HDL CHOLESTEROL BY NMR: 63 mg/dL (ref >39)
HDL PARTICLE NUMBER: 39.4 umol/L (ref >=30.5)
LDL Particle Number: 1162 nmol/L — ABNORMAL HIGH (ref <1000)
LDL Size: 20.7 nm (ref >20.5)
LDL-C: 94 mg/dL (ref 0–99)
LP-IR Score: 45 (ref <=45)
SMALL LDL PARTICLE NUMBER: 499 nmol/L (ref <=527)
Triglycerides by NMR: 116 mg/dL (ref 0–149)

## 2014-03-20 ENCOUNTER — Telehealth: Payer: Self-pay | Admitting: Nurse Practitioner

## 2014-03-20 NOTE — Telephone Encounter (Signed)
Printed and put in mail 03/20/2014

## 2014-06-04 DIAGNOSIS — C52 Malignant neoplasm of vagina: Secondary | ICD-10-CM | POA: Diagnosis not present

## 2014-06-04 DIAGNOSIS — Z8544 Personal history of malignant neoplasm of other female genital organs: Secondary | ICD-10-CM | POA: Diagnosis not present

## 2014-06-15 ENCOUNTER — Ambulatory Visit: Payer: Medicare Other | Admitting: Nurse Practitioner

## 2014-07-20 ENCOUNTER — Ambulatory Visit: Payer: Medicare Other | Admitting: Nurse Practitioner

## 2014-07-23 DIAGNOSIS — W540XXA Bitten by dog, initial encounter: Secondary | ICD-10-CM | POA: Diagnosis not present

## 2014-07-23 DIAGNOSIS — S61412A Laceration without foreign body of left hand, initial encounter: Secondary | ICD-10-CM | POA: Diagnosis not present

## 2014-07-29 ENCOUNTER — Ambulatory Visit (INDEPENDENT_AMBULATORY_CARE_PROVIDER_SITE_OTHER): Payer: Medicare Other | Admitting: Nurse Practitioner

## 2014-07-29 ENCOUNTER — Encounter: Payer: Self-pay | Admitting: Nurse Practitioner

## 2014-07-29 VITALS — BP 139/77 | HR 88 | Temp 97.2°F | Ht 64.0 in | Wt 142.0 lb

## 2014-07-29 DIAGNOSIS — E785 Hyperlipidemia, unspecified: Secondary | ICD-10-CM

## 2014-07-29 DIAGNOSIS — I10 Essential (primary) hypertension: Secondary | ICD-10-CM

## 2014-07-29 DIAGNOSIS — R3915 Urgency of urination: Secondary | ICD-10-CM | POA: Diagnosis not present

## 2014-07-29 DIAGNOSIS — Z23 Encounter for immunization: Secondary | ICD-10-CM

## 2014-07-29 DIAGNOSIS — J302 Other seasonal allergic rhinitis: Secondary | ICD-10-CM | POA: Diagnosis not present

## 2014-07-29 MED ORDER — OXYBUTYNIN CHLORIDE 5 MG PO TABS: 5.0000 mg | ORAL_TABLET | Freq: Two times a day (BID) | ORAL | Status: DC

## 2014-07-29 MED ORDER — ENALAPRIL MALEATE 20 MG PO TABS: ORAL_TABLET | ORAL | Status: DC

## 2014-07-29 NOTE — Progress Notes (Signed)
   Subjective:    Patient ID: Sheri Russell, female    DOB: 01-28-1946, 69 y.o.   MRN: [id number]  Patient here today for follow up- doing well- no changes since last visit.  Hypertension This is a chronic problem. The current episode started more than 1 year ago. The problem is controlled. Pertinent negatives include no chest pain, headaches, neck pain, palpitations or shortness of breath. Risk factors for coronary artery disease include dyslipidemia and post-menopausal state. Past treatments include ACE inhibitors. The current treatment provides moderate improvement. Compliance problems include diet and exercise.   Hyperlipidemia This is a chronic problem. The current episode started more than 1 year ago. The problem is controlled. Recent lipid tests were reviewed and are normal. She has no history of diabetes, hypothyroidism or obesity. Pertinent negatives include no chest pain or shortness of breath. She is currently on no antihyperlipidemic treatment. The current treatment provides mild improvement of lipids. Compliance problems include adherence to diet and adherence to exercise.  Risk factors for coronary artery disease include dyslipidemia, hypertension and post-menopausal.  Seasonal allergies Currently on flonase and zyrtec- doig well right now. Urinary urgency Ditropan daily- keeps symptoms under control    Review of Systems  Constitutional: Negative.   HENT: Negative.   Respiratory: Negative for shortness of breath.   Cardiovascular: Negative for chest pain and palpitations.  Genitourinary: Negative.   Musculoskeletal: Negative for neck pain.  Neurological: Negative.  Negative for headaches.  Psychiatric/Behavioral: Negative.   All other systems reviewed and are negative.      Objective:   Physical Exam  Constitutional: She is oriented to person, place, and time. She appears well-developed and well-nourished.  HENT:  Nose: Nose normal.  Mouth/Throat: Oropharynx is clear and  moist.  Eyes: EOM are normal.  Neck: Trachea normal, normal range of motion and full passive range of motion without pain. Neck supple. No JVD present. Carotid bruit is not present. No thyromegaly present.  Cardiovascular: Normal rate, regular rhythm, normal heart sounds and intact distal pulses.  Exam reveals no gallop and no friction rub.   No murmur heard. Pulmonary/Chest: Effort normal and breath sounds normal.  Abdominal: Soft. Bowel sounds are normal. She exhibits no distension and no mass. There is no tenderness.  Musculoskeletal: Normal range of motion.  Lymphadenopathy:    She has no cervical adenopathy.  Neurological: She is alert and oriented to person, place, and time. She has normal reflexes.  Skin: Skin is warm and dry.  Psychiatric: She has a normal mood and affect. Her behavior is normal. Judgment and thought content normal.    BP 139/77 mmHg  Pulse 88  Temp(Src) 97.2 F (36.2 C) (Oral)  Ht $R'5\' 4"'ON$  (1.626 m)  Wt 142 lb (64.411 kg)  BMI 24.36 kg/m2       Assessment & Plan:   1. Essential hypertension, benign Do not add salt to diet - enalapril (VASOTEC) 20 MG tablet; TAKE ONE TABLET BY MOUTH ONCE DAILY  Dispense: 30 tablet; Refill: 5 - CMP14+EGFR  2. Other seasonal allergic rhinitis  3. Hyperlipidemia with target LDL less than 100 Low fat diet - NMR, lipoprofile  4. Urinary urgency - oxybutynin (DITROPAN) 5 MG tablet; Take 1 tablet (5 mg total) by mouth 2 (two) times daily.  Dispense: 60 tablet; Refill: 5   prevnar 13 today Labs pending Health maintenance reviewed Diet and exercise encouraged Continue all meds Follow up  In 3 month   [place], FNP

## 2014-07-29 NOTE — Patient Instructions (Signed)
Fat and Cholesterol Control Diet Fat and cholesterol levels in your blood and organs are influenced by your diet. High levels of fat and cholesterol may lead to diseases of the heart, small and large blood vessels, gallbladder, liver, and pancreas. CONTROLLING FAT AND CHOLESTEROL WITH DIET Although exercise and lifestyle factors are important, your diet is key. That is because certain foods are known to raise cholesterol and others to lower it. The goal is to balance foods for their effect on cholesterol and more importantly, to replace saturated and trans fat with other types of fat, such as monounsaturated fat, polyunsaturated fat, and omega-3 fatty acids. On average, a person should consume no more than 15 to 17 g of saturated fat daily. Saturated and trans fats are considered "bad" fats, and they will raise LDL cholesterol. Saturated fats are primarily found in animal products such as meats, butter, and cream. However, that does not mean you need to give up all your favorite foods. Today, there are good tasting, low-fat, low-cholesterol substitutes for most of the things you like to eat. Choose low-fat or nonfat alternatives. Choose round or loin cuts of red meat. These types of cuts are lowest in fat and cholesterol. Chicken (without the skin), fish, veal, and ground turkey breast are great choices. Eliminate fatty meats, such as hot dogs and salami. Even shellfish have little or no saturated fat. Have a 3 oz (85 g) portion when you eat lean meat, poultry, or fish. Trans fats are also called "partially hydrogenated oils." They are oils that have been scientifically manipulated so that they are solid at room temperature resulting in a longer shelf life and improved taste and texture of foods in which they are added. Trans fats are found in stick margarine, some tub margarines, cookies, crackers, and baked goods.  When baking and cooking, oils are a great substitute for butter. The monounsaturated oils are  especially beneficial since it is believed they lower LDL and raise HDL. The oils you should avoid entirely are saturated tropical oils, such as coconut and palm.  Remember to eat a lot from food groups that are naturally free of saturated and trans fat, including fish, fruit, vegetables, beans, grains (barley, rice, couscous, bulgur wheat), and pasta (without cream sauces).  IDENTIFYING FOODS THAT LOWER FAT AND CHOLESTEROL  Soluble fiber may lower your cholesterol. This type of fiber is found in fruits such as apples, vegetables such as broccoli, potatoes, and carrots, legumes such as beans, peas, and lentils, and grains such as barley. Foods fortified with plant sterols (phytosterol) may also lower cholesterol. You should eat at least 2 g per day of these foods for a cholesterol lowering effect.  Read package labels to identify low-saturated fats, trans fat free, and low-fat foods at the supermarket. Select cheeses that have only 2 to 3 g saturated fat per ounce. Use a heart-healthy tub margarine that is free of trans fats or partially hydrogenated oil. When buying baked goods (cookies, crackers), avoid partially hydrogenated oils. Breads and muffins should be made from whole grains (whole-wheat or whole oat flour, instead of "flour" or "enriched flour"). Buy non-creamy canned soups with reduced salt and no added fats.  FOOD PREPARATION TECHNIQUES  Never deep-fry. If you must fry, either stir-fry, which uses very little fat, or use non-stick cooking sprays. When possible, broil, bake, or roast meats, and steam vegetables. Instead of putting butter or margarine on vegetables, use lemon and herbs, applesauce, and cinnamon (for squash and sweet potatoes). Use nonfat   yogurt, salsa, and low-fat dressings for salads.  LOW-SATURATED FAT / LOW-FAT FOOD SUBSTITUTES Meats / Saturated Fat (g)  Avoid: Steak, marbled (3 oz/85 g) / 11 g  Choose: Steak, lean (3 oz/85 g) / 4 g  Avoid: Hamburger (3 oz/85 g) / 7  g  Choose: Hamburger, lean (3 oz/85 g) / 5 g  Avoid: Ham (3 oz/85 g) / 6 g  Choose: Ham, lean cut (3 oz/85 g) / 2.4 g  Avoid: Chicken, with skin, dark meat (3 oz/85 g) / 4 g  Choose: Chicken, skin removed, dark meat (3 oz/85 g) / 2 g  Avoid: Chicken, with skin, light meat (3 oz/85 g) / 2.5 g  Choose: Chicken, skin removed, light meat (3 oz/85 g) / 1 g Dairy / Saturated Fat (g)  Avoid: Whole milk (1 cup) / 5 g  Choose: Low-fat milk, 2% (1 cup) / 3 g  Choose: Low-fat milk, 1% (1 cup) / 1.5 g  Choose: Skim milk (1 cup) / 0.3 g  Avoid: Hard cheese (1 oz/28 g) / 6 g  Choose: Skim milk cheese (1 oz/28 g) / 2 to 3 g  Avoid: Cottage cheese, 4% fat (1 cup) / 6.5 g  Choose: Low-fat cottage cheese, 1% fat (1 cup) / 1.5 g  Avoid: Ice cream (1 cup) / 9 g  Choose: Sherbet (1 cup) / 2.5 g  Choose: Nonfat frozen yogurt (1 cup) / 0.3 g  Choose: Frozen fruit bar / trace  Avoid: Whipped cream (1 tbs) / 3.5 g  Choose: Nondairy whipped topping (1 tbs) / 1 g Condiments / Saturated Fat (g)  Avoid: Mayonnaise (1 tbs) / 2 g  Choose: Low-fat mayonnaise (1 tbs) / 1 g  Avoid: Butter (1 tbs) / 7 g  Choose: Extra light margarine (1 tbs) / 1 g  Avoid: Coconut oil (1 tbs) / 11.8 g  Choose: Olive oil (1 tbs) / 1.8 g  Choose: Corn oil (1 tbs) / 1.7 g  Choose: Safflower oil (1 tbs) / 1.2 g  Choose: Sunflower oil (1 tbs) / 1.4 g  Choose: Soybean oil (1 tbs) / 2.4 g  Choose: Canola oil (1 tbs) / 1 g Document Released: 03/20/2005 Document Revised: 07/15/2012 Document Reviewed: 06/18/2013 [employer] Patient Information 2015 [employer]. This information is not intended to replace advice given to you by your health care provider. Make sure you discuss any questions you have with your health care provider.  

## 2014-07-29 NOTE — Addendum Note (Signed)
Addended by: Rolena Infante on: 07/29/2014 03:10 PM   Modules accepted: Orders

## 2014-07-30 LAB — CMP14+EGFR
ALT: 36 IU/L — AB (ref 0–32)
AST: 29 IU/L (ref 0–40)
Albumin/Globulin Ratio: 1.5 (ref 1.1–2.5)
Albumin: 4.4 g/dL (ref 3.6–4.8)
Alkaline Phosphatase: 80 IU/L (ref 39–117)
BILIRUBIN TOTAL: 0.3 mg/dL (ref 0.0–1.2)
BUN/Creatinine Ratio: 14 (ref 11–26)
BUN: 9 mg/dL (ref 8–27)
CO2: 25 mmol/L (ref 18–29)
Calcium: 9.5 mg/dL (ref 8.7–10.3)
Chloride: 99 mmol/L (ref 97–108)
Creatinine, Ser: 0.66 mg/dL (ref 0.57–1.00)
GFR calc non Af Amer: 90 mL/min/{1.73_m2} (ref >59)
GFR, EST AFRICAN AMERICAN: 104 mL/min/{1.73_m2} (ref >59)
Globulin, Total: 3 g/dL (ref 1.5–4.5)
Glucose: 85 mg/dL (ref 65–99)
POTASSIUM: 4.3 mmol/L (ref 3.5–5.2)
Sodium: 140 mmol/L (ref 134–144)
TOTAL PROTEIN: 7.4 g/dL (ref 6.0–8.5)

## 2014-07-30 LAB — NMR, LIPOPROFILE
CHOLESTEROL: 140 mg/dL (ref 100–199)
HDL CHOLESTEROL BY NMR: 61 mg/dL (ref >39)
HDL Particle Number: 38.7 umol/L (ref >=30.5)
LDL Particle Number: 821 nmol/L (ref <1000)
LDL Size: 20.7 nm (ref >20.5)
LDL-C: 60 mg/dL (ref 0–99)
LP-IR SCORE: 43 (ref <=45)
Small LDL Particle Number: 437 nmol/L (ref <=527)
Triglycerides by NMR: 93 mg/dL (ref 0–149)

## 2014-08-07 ENCOUNTER — Telehealth: Payer: Self-pay | Admitting: Nurse Practitioner

## 2014-08-12 NOTE — Telephone Encounter (Signed)
Patient aware of results.

## 2014-12-24 DIAGNOSIS — N893 Dysplasia of vagina, unspecified: Secondary | ICD-10-CM | POA: Diagnosis not present

## 2014-12-24 DIAGNOSIS — I10 Essential (primary) hypertension: Secondary | ICD-10-CM | POA: Diagnosis not present

## 2014-12-24 DIAGNOSIS — C52 Malignant neoplasm of vagina: Secondary | ICD-10-CM | POA: Diagnosis not present

## 2015-02-01 DIAGNOSIS — Z1231 Encounter for screening mammogram for malignant neoplasm of breast: Secondary | ICD-10-CM | POA: Diagnosis not present

## 2015-02-01 LAB — HM MAMMOGRAPHY

## 2015-02-05 ENCOUNTER — Ambulatory Visit (INDEPENDENT_AMBULATORY_CARE_PROVIDER_SITE_OTHER): Payer: Medicare Other | Admitting: Nurse Practitioner

## 2015-02-05 ENCOUNTER — Encounter: Payer: Self-pay | Admitting: Nurse Practitioner

## 2015-02-05 VITALS — BP 122/84 | HR 77 | Temp 97.0°F | Ht 64.0 in | Wt 146.0 lb

## 2015-02-05 DIAGNOSIS — J301 Allergic rhinitis due to pollen: Secondary | ICD-10-CM

## 2015-02-05 DIAGNOSIS — I10 Essential (primary) hypertension: Secondary | ICD-10-CM | POA: Diagnosis not present

## 2015-02-05 DIAGNOSIS — R3915 Urgency of urination: Secondary | ICD-10-CM

## 2015-02-05 DIAGNOSIS — Z1159 Encounter for screening for other viral diseases: Secondary | ICD-10-CM | POA: Diagnosis not present

## 2015-02-05 DIAGNOSIS — N823 Fistula of vagina to large intestine: Secondary | ICD-10-CM

## 2015-02-05 DIAGNOSIS — C52 Malignant neoplasm of vagina: Secondary | ICD-10-CM | POA: Diagnosis not present

## 2015-02-05 DIAGNOSIS — E785 Hyperlipidemia, unspecified: Secondary | ICD-10-CM | POA: Diagnosis not present

## 2015-02-05 MED ORDER — ENALAPRIL MALEATE 20 MG PO TABS: ORAL_TABLET | ORAL | Status: DC

## 2015-02-05 MED ORDER — OXYBUTYNIN CHLORIDE 5 MG PO TABS: 5.0000 mg | ORAL_TABLET | Freq: Two times a day (BID) | ORAL | Status: DC

## 2015-02-05 NOTE — Patient Instructions (Signed)
Health Maintenance, Female Adopting a healthy lifestyle and getting preventive care can go a long way to promote health and wellness. Talk with your health care provider about what schedule of regular examinations is right for you. This is a good chance for you to check in with your provider about disease prevention and staying healthy. In between checkups, there are plenty of things you can do on your own. Experts have done a lot of research about which lifestyle changes and preventive measures are most likely to keep you healthy. Ask your health care provider for more information. WEIGHT AND DIET  Eat a healthy diet  Be sure to include plenty of vegetables, fruits, low-fat dairy products, and lean protein.  Do not eat a lot of foods high in solid fats, added sugars, or salt.  Get regular exercise. This is one of the most important things you can do for your health.  Most adults should exercise for at least 150 minutes each week. The exercise should increase your heart rate and make you sweat (moderate-intensity exercise).  Most adults should also do strengthening exercises at least twice a week. This is in addition to the moderate-intensity exercise.  Maintain a healthy weight  Body mass index (BMI) is a measurement that can be used to identify possible weight problems. It estimates body fat based on height and weight. Your health care provider can help determine your BMI and help you achieve or maintain a healthy weight.  For females 20 years of age and older:   A BMI below 18.5 is considered underweight.  A BMI of 18.5 to 24.9 is normal.  A BMI of 25 to 29.9 is considered overweight.  A BMI of 30 and above is considered obese.  Watch levels of cholesterol and blood lipids  You should start having your blood tested for lipids and cholesterol at 69 years of age, then have this test every 5 years.  You may need to have your cholesterol levels checked more often if:  Your lipid  or cholesterol levels are high.  You are older than 69 years of age.  You are at high risk for heart disease.  CANCER SCREENING   Lung Cancer  Lung cancer screening is recommended for adults 55-80 years old who are at high risk for lung cancer because of a history of smoking.  A yearly low-dose CT scan of the lungs is recommended for people who:  Currently smoke.  Have quit within the past 15 years.  Have at least a 30-pack-year history of smoking. A pack year is smoking an average of one pack of cigarettes a day for 1 year.  Yearly screening should continue until it has been 15 years since you quit.  Yearly screening should stop if you develop a health problem that would prevent you from having lung cancer treatment.  Breast Cancer  Practice breast self-awareness. This means understanding how your breasts normally appear and feel.  It also means doing regular breast self-exams. Let your health care provider know about any changes, no matter how small.  If you are in your 20s or 30s, you should have a clinical breast exam (CBE) by a health care provider every 1-3 years as part of a regular health exam.  If you are 40 or older, have a CBE every year. Also consider having a breast X-ray (mammogram) every year.  If you have a family history of breast cancer, talk to your health care provider about genetic screening.  If you   are at high risk for breast cancer, talk to your health care provider about having an MRI and a mammogram every year.  Breast cancer gene (BRCA) assessment is recommended for women who have family members with BRCA-related cancers. BRCA-related cancers include:  Breast.  Ovarian.  Tubal.  Peritoneal cancers.  Results of the assessment will determine the need for genetic counseling and BRCA1 and BRCA2 testing. Cervical Cancer Your health care provider may recommend that you be screened regularly for cancer of the pelvic organs (ovaries, uterus, and  vagina). This screening involves a pelvic examination, including checking for microscopic changes to the surface of your cervix (Pap test). You may be encouraged to have this screening done every 3 years, beginning at age 21.  For women ages 30-65, health care providers may recommend pelvic exams and Pap testing every 3 years, or they may recommend the Pap and pelvic exam, combined with testing for human papilloma virus (HPV), every 5 years. Some types of HPV increase your risk of cervical cancer. Testing for HPV may also be done on women of any age with unclear Pap test results.  Other health care providers may not recommend any screening for nonpregnant women who are considered low risk for pelvic cancer and who do not have symptoms. Ask your health care provider if a screening pelvic exam is right for you.  If you have had past treatment for cervical cancer or a condition that could lead to cancer, you need Pap tests and screening for cancer for at least 20 years after your treatment. If Pap tests have been discontinued, your risk factors (such as having a new sexual partner) need to be reassessed to determine if screening should resume. Some women have medical problems that increase the chance of getting cervical cancer. In these cases, your health care provider may recommend more frequent screening and Pap tests. Colorectal Cancer  This type of cancer can be detected and often prevented.  Routine colorectal cancer screening usually begins at 69 years of age and continues through 69 years of age.  Your health care provider may recommend screening at an earlier age if you have risk factors for colon cancer.  Your health care provider may also recommend using home test kits to check for hidden blood in the stool.  A small camera at the end of a tube can be used to examine your colon directly (sigmoidoscopy or colonoscopy). This is done to check for the earliest forms of colorectal  cancer.  Routine screening usually begins at age 50.  Direct examination of the colon should be repeated every 5-10 years through 69 years of age. However, you may need to be screened more often if early forms of precancerous polyps or small growths are found. Skin Cancer  Check your skin from head to toe regularly.  Tell your health care provider about any new moles or changes in moles, especially if there is a change in a mole's shape or color.  Also tell your health care provider if you have a mole that is larger than the size of a pencil eraser.  Always use sunscreen. Apply sunscreen liberally and repeatedly throughout the day.  Protect yourself by wearing long sleeves, pants, a wide-brimmed hat, and sunglasses whenever you are outside. HEART DISEASE, DIABETES, AND HIGH BLOOD PRESSURE   High blood pressure causes heart disease and increases the risk of stroke. High blood pressure is more likely to develop in:  People who have blood pressure in the high end   of the normal range (130-139/85-89 mm Hg).  People who are overweight or obese.  People who are African American.  If you are 38-23 years of age, have your blood pressure checked every 3-5 years. If you are 61 years of age or older, have your blood pressure checked every year. You should have your blood pressure measured twice--once when you are at a hospital or clinic, and once when you are not at a hospital or clinic. Record the average of the two measurements. To check your blood pressure when you are not at a hospital or clinic, you can use:  An automated blood pressure machine at a pharmacy.  A home blood pressure monitor.  If you are between 45 years and 39 years old, ask your health care provider if you should take aspirin to prevent strokes.  Have regular diabetes screenings. This involves taking a blood sample to check your fasting blood sugar level.  If you are at a normal weight and have a low risk for diabetes,  have this test once every three years after 68 years of age.  If you are overweight and have a high risk for diabetes, consider being tested at a younger age or more often. PREVENTING INFECTION  Hepatitis B  If you have a higher risk for hepatitis B, you should be screened for this virus. You are considered at high risk for hepatitis B if:  You were born in a country where hepatitis B is common. Ask your health care provider which countries are considered high risk.  Your parents were born in a high-risk country, and you have not been immunized against hepatitis B (hepatitis B vaccine).  You have HIV or AIDS.  You use needles to inject street drugs.  You live with someone who has hepatitis B.  You have had sex with someone who has hepatitis B.  You get hemodialysis treatment.  You take certain medicines for conditions, including cancer, organ transplantation, and autoimmune conditions. Hepatitis C  Blood testing is recommended for:  Everyone born from [birth year] through 1965.  Anyone with known risk factors for hepatitis C. Sexually transmitted infections (STIs)  You should be screened for sexually transmitted infections (STIs) including gonorrhea and chlamydia if:  You are sexually active and are younger than 69 years of age.  You are older than 69 years of age and your health care provider tells you that you are at risk for this type of infection.  Your sexual activity has changed since you were last screened and you are at an increased risk for chlamydia or gonorrhea. Ask your health care provider if you are at risk.  If you do not have HIV, but are at risk, it may be recommended that you take a prescription medicine daily to prevent HIV infection. This is called pre-exposure prophylaxis (PrEP). You are considered at risk if:  You are sexually active and do not regularly use condoms or know the HIV status of your partner(s).  You take drugs by injection.  You are sexually  active with a partner who has HIV. Talk with your health care provider about whether you are at high risk of being infected with HIV. If you choose to begin PrEP, you should first be tested for HIV. You should then be tested every 3 months for as long as you are taking PrEP.  PREGNANCY   If you are premenopausal and you may become pregnant, ask your health care provider about preconception counseling.  If you may  become pregnant, take 400 to 800 micrograms (mcg) of folic acid every day.  If you want to prevent pregnancy, talk to your health care provider about birth control (contraception). OSTEOPOROSIS AND MENOPAUSE   Osteoporosis is a disease in which the bones lose minerals and strength with aging. This can result in serious bone fractures. Your risk for osteoporosis can be identified using a bone density scan.  If you are 61 years of age or older, or if you are at risk for osteoporosis and fractures, ask your health care provider if you should be screened.  Ask your health care provider whether you should take a calcium or vitamin D supplement to lower your risk for osteoporosis.  Menopause may have certain physical symptoms and risks.  Hormone replacement therapy may reduce some of these symptoms and risks. Talk to your health care provider about whether hormone replacement therapy is right for you.  HOME CARE INSTRUCTIONS   Schedule regular health, dental, and eye exams.  Stay current with your immunizations.   Do not use any tobacco products including cigarettes, chewing tobacco, or electronic cigarettes.  If you are pregnant, do not drink alcohol.  If you are breastfeeding, limit how much and how often you drink alcohol.  Limit alcohol intake to no more than 1 drink per day for nonpregnant women. One drink equals 12 ounces of beer, 5 ounces of wine, or 1 ounces of hard liquor.  Do not use street drugs.  Do not share needles.  Ask your health care provider for help if  you need support or information about quitting drugs.  Tell your health care provider if you often feel depressed.  Tell your health care provider if you have ever been abused or do not feel safe at home.   This information is not intended to replace advice given to you by your health care provider. Make sure you discuss any questions you have with your health care provider.   Document Released: 10/03/2010 Document Revised: 04/10/2014 Document Reviewed: 02/19/2013 Elsevier Interactive Patient Education [employer].

## 2015-02-05 NOTE — Progress Notes (Signed)
Subjective:    Patient ID: Sheri Russell, female    DOB: Jul 09, 1945, 69 y.o.   MRN: [id number]  Patient here today for follow up- doing well- no changes since last visit.  Hypertension This is a chronic problem. The current episode started more than 1 year ago. The problem is controlled. Pertinent negatives include no chest pain, headaches, neck pain, palpitations or shortness of breath. Risk factors for coronary artery disease include dyslipidemia and post-menopausal state. Past treatments include ACE inhibitors. The current treatment provides moderate improvement. Compliance problems include diet and exercise.   Hyperlipidemia This is a chronic problem. The current episode started more than 1 year ago. The problem is controlled. Recent lipid tests were reviewed and are normal. She has no history of diabetes, hypothyroidism or obesity. Pertinent negatives include no chest pain or shortness of breath. She is currently on no antihyperlipidemic treatment. The current treatment provides mild improvement of lipids. Compliance problems include adherence to diet and adherence to exercise.  Risk factors for coronary artery disease include dyslipidemia, hypertension and post-menopausal.  Seasonal allergies Currently on flonase and zyrtec- doig well right now. Urinary urgency Ditropan daily- keeps symptoms under control Hx of vaginal cancer/caused rectal vaginal fistula Finished treatments long ago- no problems right now  Review of Systems  Constitutional: Negative.   HENT: Negative.   Respiratory: Negative for shortness of breath.   Cardiovascular: Negative for chest pain and palpitations.  Genitourinary: Negative.   Musculoskeletal: Negative for neck pain.  Neurological: Negative.  Negative for headaches.  Psychiatric/Behavioral: Negative.   All other systems reviewed and are negative.      Objective:   Physical Exam  Constitutional: She is oriented to person, place, and time. She appears  well-developed and well-nourished.  HENT:  Nose: Nose normal.  Mouth/Throat: Oropharynx is clear and moist.  Eyes: EOM are normal.  Neck: Trachea normal, normal range of motion and full passive range of motion without pain. Neck supple. No JVD present. Carotid bruit is not present. No thyromegaly present.  Cardiovascular: Normal rate, regular rhythm, normal heart sounds and intact distal pulses.  Exam reveals no gallop and no friction rub.   No murmur heard. Pulmonary/Chest: Effort normal and breath sounds normal.  Abdominal: Soft. Bowel sounds are normal. She exhibits no distension and no mass. There is no tenderness.  Musculoskeletal: Normal range of motion.  Lymphadenopathy:    She has no cervical adenopathy.  Neurological: She is alert and oriented to person, place, and time. She has normal reflexes.  Skin: Skin is warm and dry.  Psychiatric: She has a normal mood and affect. Her behavior is normal. Judgment and thought content normal.   BP 122/84 mmHg  Pulse 77  Temp(Src) 97 F (36.1 C) (Oral)  Ht 5' 4"  (1.626 m)  Wt 146 lb (66.225 kg)  BMI 25.05 kg/m2         Assessment & Plan:   1. Essential hypertension, benign Do not add salt to diet - enalapril (VASOTEC) 20 MG tablet; TAKE ONE TABLET BY MOUTH ONCE DAILY  Dispense: 30 tablet; Refill: 5 - CMP14+EGFR  2. Hyperlipidemia with target LDL less than 100 Low fat diet - Lipid panel  3. Urinary urgency - oxybutynin (DITROPAN) 5 MG tablet; Take 1 tablet (5 mg total) by mouth 2 (two) times daily.  Dispense: 60 tablet; Refill: 5  4. Allergic rhinitis due to pollen  5. Rectal vaginal fistula  6. 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 Need  for hepatitis C screening test - Hepatitis C antibody    Labs pending Health maintenance reviewed Diet and exercise encouraged Continue all meds Follow up  In 6 months   [place], FNP

## 2015-02-06 LAB — LIPID PANEL
CHOL/HDL RATIO: 2.8 ratio (ref 0.0–4.4)
Cholesterol, Total: 182 mg/dL (ref 100–199)
HDL: 65 mg/dL (ref >39)
LDL CALC: 98 mg/dL (ref 0–99)
Triglycerides: 97 mg/dL (ref 0–149)
VLDL Cholesterol Cal: 19 mg/dL (ref 5–40)

## 2015-02-06 LAB — CMP14+EGFR
ALBUMIN: 4.3 g/dL (ref 3.6–4.8)
ALK PHOS: 78 IU/L (ref 39–117)
ALT: 27 IU/L (ref 0–32)
AST: 25 IU/L (ref 0–40)
Albumin/Globulin Ratio: 1.3 (ref 1.1–2.5)
BILIRUBIN TOTAL: 0.4 mg/dL (ref 0.0–1.2)
BUN / CREAT RATIO: 16 (ref 11–26)
BUN: 12 mg/dL (ref 8–27)
CO2: 24 mmol/L (ref 18–29)
Calcium: 9.3 mg/dL (ref 8.7–10.3)
Chloride: 101 mmol/L (ref 97–106)
Creatinine, Ser: 0.73 mg/dL (ref 0.57–1.00)
GFR calc Af Amer: 97 mL/min/{1.73_m2} (ref >59)
GFR calc non Af Amer: 84 mL/min/{1.73_m2} (ref >59)
GLOBULIN, TOTAL: 3.3 g/dL (ref 1.5–4.5)
GLUCOSE: 96 mg/dL (ref 65–99)
Potassium: 3.9 mmol/L (ref 3.5–5.2)
SODIUM: 143 mmol/L (ref 136–144)
Total Protein: 7.6 g/dL (ref 6.0–8.5)

## 2015-02-06 LAB — HEPATITIS C ANTIBODY

## 2015-02-08 ENCOUNTER — Encounter: Payer: Self-pay | Admitting: *Deleted

## 2015-02-15 ENCOUNTER — Telehealth: Payer: Self-pay | Admitting: Nurse Practitioner

## 2015-02-15 NOTE — Telephone Encounter (Signed)
Mailed labs to pt per pt request/lc

## 2015-06-14 DIAGNOSIS — H40013 Open angle with borderline findings, low risk, bilateral: Secondary | ICD-10-CM | POA: Diagnosis not present

## 2015-07-01 DIAGNOSIS — Z90710 Acquired absence of both cervix and uterus: Secondary | ICD-10-CM | POA: Diagnosis not present

## 2015-07-01 DIAGNOSIS — Z8544 Personal history of malignant neoplasm of other female genital organs: Secondary | ICD-10-CM | POA: Diagnosis not present

## 2015-07-01 DIAGNOSIS — C52 Malignant neoplasm of vagina: Secondary | ICD-10-CM | POA: Diagnosis not present

## 2015-07-01 DIAGNOSIS — R32 Unspecified urinary incontinence: Secondary | ICD-10-CM | POA: Diagnosis not present

## 2015-07-01 DIAGNOSIS — Z08 Encounter for follow-up examination after completed treatment for malignant neoplasm: Secondary | ICD-10-CM | POA: Diagnosis not present

## 2015-08-05 ENCOUNTER — Other Ambulatory Visit: Payer: Self-pay | Admitting: Nurse Practitioner

## 2015-08-16 ENCOUNTER — Encounter: Payer: Self-pay | Admitting: Nurse Practitioner

## 2015-08-16 ENCOUNTER — Ambulatory Visit (INDEPENDENT_AMBULATORY_CARE_PROVIDER_SITE_OTHER): Payer: Medicare Other | Admitting: Nurse Practitioner

## 2015-08-16 VITALS — BP 106/63 | HR 81 | Temp 97.1°F | Ht 64.0 in | Wt 142.0 lb

## 2015-08-16 DIAGNOSIS — I10 Essential (primary) hypertension: Secondary | ICD-10-CM

## 2015-08-16 DIAGNOSIS — J301 Allergic rhinitis due to pollen: Secondary | ICD-10-CM | POA: Diagnosis not present

## 2015-08-16 DIAGNOSIS — R3915 Urgency of urination: Secondary | ICD-10-CM | POA: Diagnosis not present

## 2015-08-16 DIAGNOSIS — C52 Malignant neoplasm of vagina: Secondary | ICD-10-CM

## 2015-08-16 DIAGNOSIS — N823 Fistula of vagina to large intestine: Secondary | ICD-10-CM

## 2015-08-16 DIAGNOSIS — Z933 Colostomy status: Secondary | ICD-10-CM | POA: Diagnosis not present

## 2015-08-16 DIAGNOSIS — E785 Hyperlipidemia, unspecified: Secondary | ICD-10-CM

## 2015-08-16 MED ORDER — ENALAPRIL MALEATE 20 MG PO TABS: 20.0000 mg | ORAL_TABLET | Freq: Every day | ORAL | Status: DC

## 2015-08-16 MED ORDER — OXYBUTYNIN CHLORIDE 5 MG PO TABS: 5.0000 mg | ORAL_TABLET | Freq: Two times a day (BID) | ORAL | Status: DC

## 2015-08-16 NOTE — Progress Notes (Signed)
Subjective:    Patient ID: Sheri Russell, female    DOB: 10/06/1945, 70 y.o.   MRN: [id number]  Patient here today for follow up of chronic medical problems.  Outpatient Encounter Prescriptions as of 08/16/2015  Medication Sig  . aspirin 81 MG tablet Take 81 mg by mouth daily.  . Biotin 1000 MCG tablet Take 1,000 mcg by mouth 3 (three) times daily.  . enalapril (VASOTEC) 20 MG tablet TAKE ONE TABLET BY MOUTH ONCE DAILY  . fish oil-omega-3 fatty acids 1000 MG capsule Take 2 g by mouth daily.    . fluticasone (FLONASE) 50 MCG/ACT nasal spray   . glucosamine-chondroitin 500-400 MG tablet Take 1 tablet by mouth.  . Multiple Vitamin (MULTIVITAMIN PO) Take by mouth.    . oxybutynin (DITROPAN) 5 MG tablet Take 1 tablet (5 mg total) by mouth 2 (two) times daily.   No facility-administered encounter medications on file as of 08/16/2015.    Hypertension This is a chronic problem. The current episode started more than 1 year ago. The problem is controlled. Pertinent negatives include no chest pain, headaches, neck pain, palpitations or shortness of breath. Risk factors for coronary artery disease include dyslipidemia and post-menopausal state. Past treatments include ACE inhibitors. The current treatment provides moderate improvement. Compliance problems include diet and exercise.   Hyperlipidemia This is a chronic problem. The current episode started more than 1 year ago. The problem is controlled. Recent lipid tests were reviewed and are normal. She has no history of diabetes, hypothyroidism or obesity. Pertinent negatives include no chest pain or shortness of breath. She is currently on no antihyperlipidemic treatment. The current treatment provides mild improvement of lipids. Compliance problems include adherence to diet and adherence to exercise.  Risk factors for coronary artery disease include dyslipidemia, hypertension and post-menopausal.  Seasonal allergies/allergic rhinitis Currently on  flonase and zyrtec- doig well right now. 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    Review of Systems  Constitutional: Negative.   HENT: Negative.   Respiratory: Negative for shortness of breath.   Cardiovascular: Negative for chest pain and palpitations.  Genitourinary: Negative.   Musculoskeletal: Negative for neck pain.  Neurological: Negative.  Negative for headaches.  Psychiatric/Behavioral: Negative.   All other systems reviewed and are negative.      Objective:   Physical Exam  Constitutional: She is oriented to person, place, and time. She appears well-developed and well-nourished.  HENT:  Nose: Nose normal.  Mouth/Throat: Oropharynx is clear and moist.  Eyes: EOM are normal.  Neck: Trachea normal, normal range of motion and full passive range of motion without pain. Neck supple. No JVD present. Carotid bruit is not present. No thyromegaly present.  Cardiovascular: Normal rate, regular rhythm, normal heart sounds and intact distal pulses.  Exam reveals no gallop and no friction rub.   No murmur heard. Pulmonary/Chest: Effort normal and breath sounds normal.  Abdominal: Soft. Bowel sounds are normal. She exhibits no distension and no mass. There is no tenderness.  Colostomy stump pink with no signs of infection.  Musculoskeletal: Normal range of motion.  Lymphadenopathy:    She has no cervical adenopathy.  Neurological: She is alert and oriented to person, place, and time. She has normal reflexes.  Skin: Skin is warm and dry.  Psychiatric: She has a normal mood and affect. Her behavior is normal. Judgment and thought content normal.    BP 106/63 mmHg  Pulse 81  Temp(Src) 97.1 F (36.2 C) (Oral)   Ht 5' 4"  (1.626 m)  Wt 142 lb (64.411 kg)  BMI 24.36 kg/m2      Assessment & Plan:   1. Essential hypertension, benign Do not add salt to diet - CMP14+EGFR - enalapril (VASOTEC) 20 MG tablet; Take 1 tablet (20 mg total) by mouth daily.  Dispense: 90 tablet; Refill: 1  2. Hyperlipidemia with target LDL less than 100 Low fat diet - Lipid panel  3. Urinary urgency Continue meds - oxybutynin (DITROPAN) 5 MG tablet; Take 1 tablet (5 mg total) by mouth 2 (two) times daily.  Dispense: 180 tablet; Refill: 1  4. Cancer of vagina ([place])  5. Rectal vaginal fistula  6. 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

## 2015-08-17 LAB — LIPID PANEL
CHOLESTEROL TOTAL: 159 mg/dL (ref 100–199)
Chol/HDL Ratio: 2.5 ratio units (ref 0.0–4.4)
HDL: 64 mg/dL (ref >39)
LDL Calculated: 69 mg/dL (ref 0–99)
TRIGLYCERIDES: 131 mg/dL (ref 0–149)
VLDL CHOLESTEROL CAL: 26 mg/dL (ref 5–40)

## 2015-08-17 LAB — CMP14+EGFR
ALBUMIN: 4.6 g/dL (ref 3.5–4.8)
ALK PHOS: 86 IU/L (ref 39–117)
ALT: 36 IU/L — AB (ref 0–32)
AST: 28 IU/L (ref 0–40)
Albumin/Globulin Ratio: 1.6 (ref 1.2–2.2)
BILIRUBIN TOTAL: 0.4 mg/dL (ref 0.0–1.2)
BUN / CREAT RATIO: 15 (ref 12–28)
BUN: 11 mg/dL (ref 8–27)
CHLORIDE: 101 mmol/L (ref 96–106)
CO2: 25 mmol/L (ref 18–29)
CREATININE: 0.73 mg/dL (ref 0.57–1.00)
Calcium: 9.3 mg/dL (ref 8.7–10.3)
GFR calc Af Amer: 96 mL/min/{1.73_m2} (ref >59)
GFR calc non Af Amer: 84 mL/min/{1.73_m2} (ref >59)
GLUCOSE: 88 mg/dL (ref 65–99)
Globulin, Total: 2.9 g/dL (ref 1.5–4.5)
Potassium: 4 mmol/L (ref 3.5–5.2)
Sodium: 141 mmol/L (ref 134–144)
Total Protein: 7.5 g/dL (ref 6.0–8.5)

## 2015-08-20 ENCOUNTER — Telehealth: Payer: Self-pay | Admitting: Nurse Practitioner

## 2015-08-20 NOTE — Telephone Encounter (Signed)
Pt aware of lab results 

## 2015-09-14 DIAGNOSIS — H40013 Open angle with borderline findings, low risk, bilateral: Secondary | ICD-10-CM | POA: Diagnosis not present

## 2015-12-31 DIAGNOSIS — H6692 Otitis media, unspecified, left ear: Secondary | ICD-10-CM | POA: Diagnosis not present

## 2015-12-31 DIAGNOSIS — C52 Malignant neoplasm of vagina: Secondary | ICD-10-CM | POA: Diagnosis not present

## 2015-12-31 DIAGNOSIS — Z923 Personal history of irradiation: Secondary | ICD-10-CM | POA: Diagnosis not present

## 2015-12-31 DIAGNOSIS — Z86010 Personal history of colonic polyps: Secondary | ICD-10-CM | POA: Diagnosis not present

## 2015-12-31 DIAGNOSIS — Z8544 Personal history of malignant neoplasm of other female genital organs: Secondary | ICD-10-CM | POA: Diagnosis not present

## 2015-12-31 DIAGNOSIS — Z933 Colostomy status: Secondary | ICD-10-CM | POA: Diagnosis not present

## 2015-12-31 DIAGNOSIS — I10 Essential (primary) hypertension: Secondary | ICD-10-CM | POA: Diagnosis not present

## 2015-12-31 DIAGNOSIS — Z08 Encounter for follow-up examination after completed treatment for malignant neoplasm: Secondary | ICD-10-CM | POA: Diagnosis not present

## 2016-03-03 ENCOUNTER — Other Ambulatory Visit: Payer: Self-pay | Admitting: Nurse Practitioner

## 2016-03-03 DIAGNOSIS — I10 Essential (primary) hypertension: Secondary | ICD-10-CM

## 2016-03-06 DIAGNOSIS — H40053 Ocular hypertension, bilateral: Secondary | ICD-10-CM | POA: Diagnosis not present

## 2016-03-10 ENCOUNTER — Other Ambulatory Visit: Payer: Self-pay

## 2016-03-20 ENCOUNTER — Ambulatory Visit (INDEPENDENT_AMBULATORY_CARE_PROVIDER_SITE_OTHER): Payer: Medicare Other | Admitting: Nurse Practitioner

## 2016-03-20 ENCOUNTER — Encounter: Payer: Self-pay | Admitting: Nurse Practitioner

## 2016-03-20 VITALS — BP 146/88 | HR 79 | Temp 97.3°F | Ht 64.0 in | Wt 146.0 lb

## 2016-03-20 DIAGNOSIS — I10 Essential (primary) hypertension: Secondary | ICD-10-CM | POA: Diagnosis not present

## 2016-03-20 DIAGNOSIS — Z23 Encounter for immunization: Secondary | ICD-10-CM | POA: Diagnosis not present

## 2016-03-20 DIAGNOSIS — R3915 Urgency of urination: Secondary | ICD-10-CM

## 2016-03-20 DIAGNOSIS — C52 Malignant neoplasm of vagina: Secondary | ICD-10-CM | POA: Diagnosis not present

## 2016-03-20 DIAGNOSIS — E785 Hyperlipidemia, unspecified: Secondary | ICD-10-CM | POA: Diagnosis not present

## 2016-03-20 DIAGNOSIS — J301 Allergic rhinitis due to pollen: Secondary | ICD-10-CM | POA: Diagnosis not present

## 2016-03-20 MED ORDER — OXYBUTYNIN CHLORIDE 5 MG PO TABS: 5.0000 mg | ORAL_TABLET | Freq: Two times a day (BID) | ORAL | 1 refills | Status: DC

## 2016-03-20 MED ORDER — ENALAPRIL MALEATE 20 MG PO TABS: 20.0000 mg | ORAL_TABLET | Freq: Every day | ORAL | 1 refills | Status: DC

## 2016-03-20 MED ORDER — HYDROCHLOROTHIAZIDE 25 MG PO TABS: 25.0000 mg | ORAL_TABLET | Freq: Every day | ORAL | 3 refills | Status: DC

## 2016-03-20 NOTE — Patient Instructions (Signed)
DASH Eating Plan DASH stands for "Dietary Approaches to Stop Hypertension." The DASH eating plan is a healthy eating plan that has been shown to reduce high blood pressure (hypertension). Additional health benefits may include reducing the risk of type 2 diabetes mellitus, heart disease, and stroke. The DASH eating plan may also help with weight loss. What do I need to know about the DASH eating plan? For the DASH eating plan, you will follow these general guidelines:  Choose foods with less than 150 milligrams of sodium per serving (as listed on the food label).  Use salt-free seasonings or herbs instead of table salt or sea salt.  Check with your health care provider or pharmacist before using salt substitutes.  Eat lower-sodium products. These are often labeled as "low-sodium" or "no salt added."  Eat fresh foods. Avoid eating a lot of canned foods.  Eat more vegetables, fruits, and low-fat dairy products.  Choose whole grains. Look for the word "whole" as the first word in the ingredient list.  Choose fish and skinless chicken or turkey more often than red meat. Limit fish, poultry, and meat to 6 oz (170 g) each day.  Limit sweets, desserts, sugars, and sugary drinks.  Choose heart-healthy fats.  Eat more home-cooked food and less restaurant, buffet, and fast food.  Limit fried foods.  Do not fry foods. Cook foods using methods such as baking, boiling, grilling, and broiling instead.  When eating at a restaurant, ask that your food be prepared with less salt, or no salt if possible. What foods can I eat? Seek help from a dietitian for individual calorie needs. Grains  Whole grain or whole wheat bread. Brown rice. Whole grain or whole wheat pasta. Quinoa, bulgur, and whole grain cereals. Low-sodium cereals. Corn or whole wheat flour tortillas. Whole grain cornbread. Whole grain crackers. Low-sodium crackers. Vegetables  Fresh or frozen vegetables (raw, steamed, roasted, or  grilled). Low-sodium or reduced-sodium tomato and vegetable juices. Low-sodium or reduced-sodium tomato sauce and paste. Low-sodium or reduced-sodium canned vegetables. Fruits  All fresh, canned (in natural juice), or frozen fruits. Meat and Other Protein Products  Ground beef (85% or leaner), grass-fed beef, or beef trimmed of fat. Skinless chicken or turkey. Ground chicken or turkey. Pork trimmed of fat. All fish and seafood. Eggs. Dried beans, peas, or lentils. Unsalted nuts and seeds. Unsalted canned beans. Dairy  Low-fat dairy products, such as skim or 1% milk, 2% or reduced-fat cheeses, low-fat ricotta or cottage cheese, or plain low-fat yogurt. Low-sodium or reduced-sodium cheeses. Fats and Oils  Tub margarines without trans fats. Light or reduced-fat mayonnaise and salad dressings (reduced sodium). Avocado. Safflower, olive, or canola oils. Natural peanut or almond butter. Other  Unsalted popcorn and pretzels. The items listed above may not be a complete list of recommended foods or beverages. Contact your dietitian for more options.  What foods are not recommended? Grains  White bread. White pasta. White rice. Refined cornbread. Bagels and croissants. Crackers that contain trans fat. Vegetables  Creamed or fried vegetables. Vegetables in a cheese sauce. Regular canned vegetables. Regular canned tomato sauce and paste. Regular tomato and vegetable juices. Fruits  Canned fruit in light or heavy syrup. Fruit juice. Meat and Other Protein Products  Fatty cuts of meat. Ribs, chicken wings, bacon, sausage, bologna, salami, chitterlings, fatback, hot dogs, bratwurst, and packaged luncheon meats. Salted nuts and seeds. Canned beans with salt. Dairy  Whole or 2% milk, cream, half-and-half, and cream cheese. Whole-fat or sweetened yogurt. Full-fat cheeses   or blue cheese. Nondairy creamers and whipped toppings. Processed cheese, cheese spreads, or cheese curds. Condiments  Onion and garlic  salt, seasoned salt, table salt, and sea salt. Canned and packaged gravies. Worcestershire sauce. Tartar sauce. Barbecue sauce. Teriyaki sauce. Soy sauce, including reduced sodium. Steak sauce. Fish sauce. Oyster sauce. Cocktail sauce. Horseradish. Ketchup and mustard. Meat flavorings and tenderizers. Bouillon cubes. Hot sauce. Tabasco sauce. Marinades. Taco seasonings. Relishes. Fats and Oils  Butter, stick margarine, lard, shortening, ghee, and bacon fat. Coconut, palm kernel, or palm oils. Regular salad dressings. Other  Pickles and olives. Salted popcorn and pretzels. The items listed above may not be a complete list of foods and beverages to avoid. Contact your dietitian for more information.  Where can I find more information? National Heart, Lung, and Blood Institute: www.nhlbi.nih.gov/health/health-topics/topics/dash/ This information is not intended to replace advice given to you by your health care provider. Make sure you discuss any questions you have with your health care provider. Document Released: 03/09/2011 Document Revised: 08/26/2015 Document Reviewed: 01/22/2013 Elsevier Interactive Patient Education  2017 Elsevier Inc.  

## 2016-03-20 NOTE — Progress Notes (Signed)
Subjective:    Patient ID: Sheri Russell, female    DOB: 1945-08-12, 70 y.o.   MRN: [id number]  Patient here today for follow up of chronic medical problems. No changes since last vist. No complaints today.  Outpatient Encounter Prescriptions as of 03/20/2016  Medication Sig  . aspirin 81 MG tablet Take 81 mg by mouth daily.  . Biotin 1000 MCG tablet Take 1,000 mcg by mouth 3 (three) times daily.  . enalapril (VASOTEC) 20 MG tablet TAKE ONE TABLET BY MOUTH ONCE DAILY  . fish oil-omega-3 fatty acids 1000 MG capsule Take 2 g by mouth daily.    Marland Kitchen glucosamine-chondroitin 500-400 MG tablet Take 1 tablet by mouth.  . Multiple Vitamin (MULTIVITAMIN PO) Take by mouth.    . oxybutynin (DITROPAN) 5 MG tablet Take 1 tablet (5 mg total) by mouth 2 (two) times daily.   No facility-administered encounter medications on file as of 03/20/2016.     Hypertension  This is a chronic problem. The current episode started more than 1 year ago. The problem is controlled. Pertinent negatives include no chest pain, headaches, neck pain, palpitations or shortness of breath. Risk factors for coronary artery disease include dyslipidemia and post-menopausal state. Past treatments include ACE inhibitors. The current treatment provides moderate improvement. Compliance problems include diet and exercise.   Hyperlipidemia  This is a chronic problem. The current episode started more than 1 year ago. The problem is controlled. Recent lipid tests were reviewed and are normal. She has no history of diabetes, hypothyroidism or obesity. Pertinent negatives include no chest pain or shortness of breath. She is currently on no antihyperlipidemic treatment. The current treatment provides mild improvement of lipids. Compliance problems include adherence to diet and adherence to exercise.  Risk factors for coronary artery disease include dyslipidemia, hypertension and post-menopausal.  Seasonal allergies/allergic rhinitis Currently on  flonase and zyrtec- doig well right now. 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  *Had episode of vertigo a month agoo which lasted 1 day and resolved on its own.  Review of Systems  Constitutional: Negative.   HENT: Negative.   Respiratory: Negative for shortness of breath.   Cardiovascular: Negative for chest pain and palpitations.  Genitourinary: Negative.   Musculoskeletal: Negative for neck pain.  Neurological: Negative.  Negative for headaches.  Psychiatric/Behavioral: Negative.   All other systems reviewed and are negative.      Objective:   Physical Exam  Constitutional: She is oriented to person, place, and time. She appears well-developed and well-nourished.  HENT:  Nose: Nose normal.  Mouth/Throat: Oropharynx is clear and moist.  Eyes: EOM are normal.  Neck: Trachea normal, normal range of motion and full passive range of motion without pain. Neck supple. No JVD present. Carotid bruit is not present. No thyromegaly present.  Cardiovascular: Normal rate, regular rhythm, normal heart sounds and intact distal pulses.  Exam reveals no gallop and no friction rub.   No murmur heard. Pulmonary/Chest: Effort normal and breath sounds normal.  Abdominal: Soft. Bowel sounds are normal. She exhibits no distension and no mass. There is no tenderness.  Colostomy stump pink with no signs of infection.  Musculoskeletal: Normal range of motion.  Lymphadenopathy:    She has no cervical adenopathy.  Neurological: She is alert and oriented  to person, place, and time. She has normal reflexes.  Skin: Skin is warm and dry.  Psychiatric: She has a normal mood and affect. Her behavior is normal. Judgment and thought content  normal.    BP (!) 146/88 (BP Location: Left Arm, Cuff Size: Normal)   Pulse 79   Temp 97.3 F (36.3 C) (Oral)   Ht 5\' 4"  (1.626 m)   Wt 146 lb (66.2 kg)   LMP 07/09/1975   BMI 25.06 kg/m       Assessment & Plan:  1. Essential hypertension, benign Added HCTZ to meds Keep diary of blood pressure - enalapril (VASOTEC) 20 MG tablet; Take 1 tablet (20 mg total) by mouth daily.  Dispense: 90 tablet; Refill: 1 - hydrochlorothiazide (HYDRODIURIL) 25 MG tablet; Take 1 tablet (25 mg total) by mouth daily.  Dispense: 90 tablet; Refill: 3  2. Chronic seasonal allergic rhinitis due to pollen  3. Hyperlipidemia with target LDL less than 100 Low fat diet  4. Urinary urgency - oxybutynin (DITROPAN) 5 MG tablet; Take 1 tablet (5 mg total) by mouth 2 (two) times daily.  Dispense: 180 tablet; Refill: 1  5. Cancer of vagina [facility]) Keep follow up with GYN   Keep appointment for mammogram Labs pending Health maintenance reviewed Diet and exercise encouraged Continue all meds Follow up  In 3 months   [place], FNP

## 2016-03-20 NOTE — Addendum Note (Signed)
Addended by: Rolena Infante on: 03/20/2016 03:31 PM   Modules accepted: Orders

## 2016-05-31 ENCOUNTER — Encounter: Payer: Medicare Other | Admitting: *Deleted

## 2016-06-12 ENCOUNTER — Encounter: Payer: Medicare Other | Admitting: *Deleted

## 2016-06-16 ENCOUNTER — Encounter: Payer: Self-pay | Admitting: Nurse Practitioner

## 2016-06-16 ENCOUNTER — Ambulatory Visit (INDEPENDENT_AMBULATORY_CARE_PROVIDER_SITE_OTHER): Payer: Medicare Other | Admitting: Nurse Practitioner

## 2016-06-16 VITALS — BP 146/80 | HR 76 | Temp 97.1°F | Ht 64.0 in | Wt 144.0 lb

## 2016-06-16 DIAGNOSIS — J301 Allergic rhinitis due to pollen: Secondary | ICD-10-CM | POA: Diagnosis not present

## 2016-06-16 DIAGNOSIS — C52 Malignant neoplasm of vagina: Secondary | ICD-10-CM | POA: Diagnosis not present

## 2016-06-16 DIAGNOSIS — R3915 Urgency of urination: Secondary | ICD-10-CM | POA: Diagnosis not present

## 2016-06-16 DIAGNOSIS — E785 Hyperlipidemia, unspecified: Secondary | ICD-10-CM | POA: Diagnosis not present

## 2016-06-16 DIAGNOSIS — I10 Essential (primary) hypertension: Secondary | ICD-10-CM

## 2016-06-16 MED ORDER — OXYBUTYNIN CHLORIDE 5 MG PO TABS: 5.0000 mg | ORAL_TABLET | Freq: Two times a day (BID) | ORAL | 1 refills | Status: DC

## 2016-06-16 MED ORDER — ENALAPRIL MALEATE 20 MG PO TABS: 20.0000 mg | ORAL_TABLET | Freq: Two times a day (BID) | ORAL | 1 refills | Status: DC

## 2016-06-16 MED ORDER — HYDROCHLOROTHIAZIDE 25 MG PO TABS: 25.0000 mg | ORAL_TABLET | Freq: Every day | ORAL | 3 refills | Status: DC

## 2016-06-16 NOTE — Patient Instructions (Signed)
DASH Eating Plan DASH stands for "Dietary Approaches to Stop Hypertension." The DASH eating plan is a healthy eating plan that has been shown to reduce high blood pressure (hypertension). It may also reduce your risk for type 2 diabetes, heart disease, and stroke. The DASH eating plan may also help with weight loss. What are tips for following this plan? General guidelines  Avoid eating more than 2,300 mg (milligrams) of salt (sodium) a day. If you have hypertension, you may need to reduce your sodium intake to 1,500 mg a day.  Limit alcohol intake to no more than 1 drink a day for nonpregnant women and 2 drinks a day for men. One drink equals 12 oz of beer, 5 oz of wine, or 1 oz of hard liquor.  Work with your health care provider to maintain a healthy body weight or to lose weight. Ask what an ideal weight is for you.  Get at least 30 minutes of exercise that causes your heart to beat faster (aerobic exercise) most days of the week. Activities may include walking, swimming, or biking.  Work with your health care provider or diet and nutrition specialist (dietitian) to adjust your eating plan to your individual calorie needs. Reading food labels  Check food labels for the amount of sodium per serving. Choose foods with less than 5 percent of the Daily Value of sodium. Generally, foods with less than 300 mg of sodium per serving fit into this eating plan.  To find whole grains, look for the word "whole" as the first word in the ingredient list. Shopping  Buy products labeled as "low-sodium" or "no salt added."  Buy fresh foods. Avoid canned foods and premade or frozen meals. Cooking  Avoid adding salt when cooking. Use salt-free seasonings or herbs instead of table salt or sea salt. Check with your health care provider or pharmacist before using salt substitutes.  Do not fry foods. Cook foods using healthy methods such as baking, boiling, grilling, and broiling instead.  Cook with  heart-healthy oils, such as olive, canola, soybean, or sunflower oil. Meal planning   Eat a balanced diet that includes: ? 5 or more servings of fruits and vegetables each day. At each meal, try to fill half of your plate with fruits and vegetables. ? Up to 6-8 servings of whole grains each day. ? Less than 6 oz of lean meat, poultry, or fish each day. A 3-oz serving of meat is about the same size as a deck of cards. One egg equals 1 oz. ? 2 servings of low-fat dairy each day. ? A serving of nuts, seeds, or beans 5 times each week. ? Heart-healthy fats. Healthy fats called Omega-3 fatty acids are found in foods such as flaxseeds and coldwater fish, like sardines, salmon, and mackerel.  Limit how much you eat of the following: ? Canned or prepackaged foods. ? Food that is high in trans fat, such as fried foods. ? Food that is high in saturated fat, such as fatty meat. ? Sweets, desserts, sugary drinks, and other foods with added sugar. ? Full-fat dairy products.  Do not salt foods before eating.  Try to eat at least 2 vegetarian meals each week.  Eat more home-cooked food and less restaurant, buffet, and fast food.  When eating at a restaurant, ask that your food be prepared with less salt or no salt, if possible. What foods are recommended? The items listed may not be a complete list. Talk with your dietitian about what   dietary choices are best for you. Grains Whole-grain or whole-wheat bread. Whole-grain or whole-wheat pasta. Brown rice. Oatmeal. Quinoa. Bulgur. Whole-grain and low-sodium cereals. Pita bread. Low-fat, low-sodium crackers. Whole-wheat flour tortillas. Vegetables Fresh or frozen vegetables (raw, steamed, roasted, or grilled). Low-sodium or reduced-sodium tomato and vegetable juice. Low-sodium or reduced-sodium tomato sauce and tomato paste. Low-sodium or reduced-sodium canned vegetables. Fruits All fresh, dried, or frozen fruit. Canned fruit in natural juice (without  added sugar). Meat and other protein foods Skinless chicken or turkey. Ground chicken or turkey. Pork with fat trimmed off. Fish and seafood. Egg whites. Dried beans, peas, or lentils. Unsalted nuts, nut butters, and seeds. Unsalted canned beans. Lean cuts of beef with fat trimmed off. Low-sodium, lean deli meat. Dairy Low-fat (1%) or fat-free (skim) milk. Fat-free, low-fat, or reduced-fat cheeses. Nonfat, low-sodium ricotta or cottage cheese. Low-fat or nonfat yogurt. Low-fat, low-sodium cheese. Fats and oils Soft margarine without trans fats. Vegetable oil. Low-fat, reduced-fat, or light mayonnaise and salad dressings (reduced-sodium). Canola, safflower, olive, soybean, and sunflower oils. Avocado. Seasoning and other foods Herbs. Spices. Seasoning mixes without salt. Unsalted popcorn and pretzels. Fat-free sweets. What foods are not recommended? The items listed may not be a complete list. Talk with your dietitian about what dietary choices are best for you. Grains Baked goods made with fat, such as croissants, muffins, or some breads. Dry pasta or rice meal packs. Vegetables Creamed or fried vegetables. Vegetables in a cheese sauce. Regular canned vegetables (not low-sodium or reduced-sodium). Regular canned tomato sauce and paste (not low-sodium or reduced-sodium). Regular tomato and vegetable juice (not low-sodium or reduced-sodium). Pickles. Olives. Fruits Canned fruit in a light or heavy syrup. Fried fruit. Fruit in cream or butter sauce. Meat and other protein foods Fatty cuts of meat. Ribs. Fried meat. Bacon. Sausage. Bologna and other processed lunch meats. Salami. Fatback. Hotdogs. Bratwurst. Salted nuts and seeds. Canned beans with added salt. Canned or smoked fish. Whole eggs or egg yolks. Chicken or turkey with skin. Dairy Whole or 2% milk, cream, and half-and-half. Whole or full-fat cream cheese. Whole-fat or sweetened yogurt. Full-fat cheese. Nondairy creamers. Whipped toppings.  Processed cheese and cheese spreads. Fats and oils Butter. Stick margarine. Lard. Shortening. Ghee. Bacon fat. Tropical oils, such as coconut, palm kernel, or palm oil. Seasoning and other foods Salted popcorn and pretzels. Onion salt, garlic salt, seasoned salt, table salt, and sea salt. Worcestershire sauce. Tartar sauce. Barbecue sauce. Teriyaki sauce. Soy sauce, including reduced-sodium. Steak sauce. Canned and packaged gravies. Fish sauce. Oyster sauce. Cocktail sauce. Horseradish that you find on the shelf. Ketchup. Mustard. Meat flavorings and tenderizers. Bouillon cubes. Hot sauce and Tabasco sauce. Premade or packaged marinades. Premade or packaged taco seasonings. Relishes. Regular salad dressings. Where to find more information:  National Heart, Lung, and Blood Institute: www.nhlbi.nih.gov  American Heart Association: www.heart.org Summary  The DASH eating plan is a healthy eating plan that has been shown to reduce high blood pressure (hypertension). It may also reduce your risk for type 2 diabetes, heart disease, and stroke.  With the DASH eating plan, you should limit salt (sodium) intake to 2,300 mg a day. If you have hypertension, you may need to reduce your sodium intake to 1,500 mg a day.  When on the DASH eating plan, aim to eat more fresh fruits and vegetables, whole grains, lean proteins, low-fat dairy, and heart-healthy fats.  Work with your health care provider or diet and nutrition specialist (dietitian) to adjust your eating plan to your individual   calorie needs. This information is not intended to replace advice given to you by your health care provider. Make sure you discuss any questions you have with your health care provider. Document Released: 03/09/2011 Document Revised: 03/13/2016 Document Reviewed: 03/13/2016 Elsevier Interactive Patient Education  2017 Elsevier Inc.  

## 2016-06-16 NOTE — Progress Notes (Signed)
Subjective:    Patient ID: Sheri Russell, female    DOB: Apr 21, 1945, 71 y.o.   MRN: [id number]  Patient here today for follow up of chronic medical problems. No changes since last vist. No complaints today.  Outpatient Encounter Prescriptions as of 06/16/2016  Medication Sig  . aspirin 81 MG tablet Take 81 mg by mouth daily.  . Biotin 1000 MCG tablet Take 1,000 mcg by mouth 3 (three) times daily.  . enalapril (VASOTEC) 20 MG tablet Take 1 tablet (20 mg total) by mouth daily.  . fish oil-omega-3 fatty acids 1000 MG capsule Take 2 g by mouth daily.    . hydrochlorothiazide (HYDRODIURIL) 25 MG tablet Take 1 tablet (25 mg total) by mouth daily.  . Multiple Vitamin (MULTIVITAMIN PO) Take by mouth.    . oxybutynin (DITROPAN) 5 MG tablet Take 1 tablet (5 mg total) by mouth 2 (two) times daily.   No facility-administered encounter medications on file as of 06/16/2016.     Hypertension  This is a chronic problem. The current episode started more than 1 year ago. The problem is controlled. Pertinent negatives include no chest pain, headaches, neck pain, palpitations or shortness of breath. Risk factors for coronary artery disease include dyslipidemia and post-menopausal state. Past treatments include ACE inhibitors. The current treatment provides moderate improvement. Compliance problems include diet and exercise.   Hyperlipidemia  This is a chronic problem. The current episode started more than 1 year ago. The problem is controlled. Recent lipid tests were reviewed and are normal. She has no history of diabetes, hypothyroidism or obesity. Pertinent negatives include no chest pain or shortness of breath. She is currently on no antihyperlipidemic treatment. The current treatment provides mild improvement of lipids. Compliance problems include adherence to diet and adherence to exercise.  Risk factors for coronary artery disease include dyslipidemia, hypertension and post-menopausal.  Seasonal  allergies/allergic rhinitis Currently on flonase and zyrtec- doig well right now. 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    Review of Systems  Constitutional: Negative.   HENT: Negative.   Respiratory: Negative for shortness of breath.   Cardiovascular: Negative for chest pain and palpitations.  Genitourinary: Negative.   Musculoskeletal: Negative for neck pain.  Neurological: Negative.  Negative for headaches.  Psychiatric/Behavioral: Negative.   All other systems reviewed and are negative.      Objective:   Physical Exam  Constitutional: She is oriented to person, place, and time. She appears well-developed and well-nourished.  HENT:  Nose: Nose normal.  Mouth/Throat: Oropharynx is clear and moist.  Eyes: EOM are normal.  Neck: Trachea normal, normal range of motion and full passive range of motion without pain. Neck supple. No JVD present. Carotid bruit is not present. No thyromegaly present.  Cardiovascular: Normal rate, regular rhythm, normal heart sounds and intact distal pulses.  Exam reveals no gallop and no friction rub.   No murmur heard. Pulmonary/Chest: Effort normal and breath sounds normal.  Abdominal: Soft. Bowel sounds are normal. She exhibits no distension and no mass. There is no tenderness.  Colostomy stump pink with no signs of infection.  Musculoskeletal: Normal range of motion.  Lymphadenopathy:    She has no cervical adenopathy.  Neurological: She is alert and oriented to person, place, and time. She has normal  reflexes.  Skin: Skin is warm and dry.  Psychiatric: She has a normal mood and affect. Her behavior is normal. Judgment and thought content normal.    BP (!) 146/80   Pulse 76    Temp 97.1 F (36.2 C) (Oral)   Ht _0  (1.626 m)   Wt 144 lb (65.3 kg)   LMP 07/09/1975   BMI 24.72 kg/m       Assessment & Plan:  1. Essential hypertension, benign Increased enalapril from qd to bid Keep diary of blood pressure at home-if not coming down need to RTO - enalapril (VASOTEC) 20 MG tablet; Take 1 tablet (20 mg total) by mouth 2 (two) times daily.  Dispense: 180 tablet; Refill: 1 - hydrochlorothiazide (HYDRODIURIL) 25 MG tablet; Take 1 tablet (25 mg total) by mouth daily.  Dispense: 90 tablet; Refill: 3 - [id number]+EGFR  2. Hyperlipidemia with target LDL less than 100 Low fat diet - Lipid panel  3. Cancer of vagina [facility]) Keep follow up appointment with oncology  4. Chronic seasonal allergic rhinitis due to pollen Continue OTC zyrtec and flonase  5. Urinary urgency - oxybutynin (DITROPAN) 5 MG tablet; Take 1 tablet (5 mg total) by mouth 2 (two) times daily.  Dispense: 180 tablet; Refill: 1    Labs pending Health maintenance reviewed Diet and exercise encouraged Continue all meds Follow up  In 6 months   [place], FNP

## 2016-06-17 LAB — CMP14+EGFR
ALT: 25 IU/L (ref 0–32)
AST: 19 IU/L (ref 0–40)
Albumin/Globulin Ratio: 1.4 (ref 1.2–2.2)
Albumin: 4.2 g/dL (ref 3.5–4.8)
Alkaline Phosphatase: 75 IU/L (ref 39–117)
BILIRUBIN TOTAL: 0.3 mg/dL (ref 0.0–1.2)
BUN/Creatinine Ratio: 17 (ref 12–28)
BUN: 13 mg/dL (ref 8–27)
CHLORIDE: 99 mmol/L (ref 96–106)
CO2: 27 mmol/L (ref 18–29)
CREATININE: 0.78 mg/dL (ref 0.57–1.00)
Calcium: 9.3 mg/dL (ref 8.7–10.3)
GFR calc Af Amer: 88 mL/min/{1.73_m2} (ref >59)
GFR calc non Af Amer: 77 mL/min/{1.73_m2} (ref >59)
GLOBULIN, TOTAL: 3 g/dL (ref 1.5–4.5)
GLUCOSE: 88 mg/dL (ref 65–99)
POTASSIUM: 3.6 mmol/L (ref 3.5–5.2)
SODIUM: 141 mmol/L (ref 134–144)
Total Protein: 7.2 g/dL (ref 6.0–8.5)

## 2016-06-17 LAB — LIPID PANEL
CHOLESTEROL TOTAL: 165 mg/dL (ref 100–199)
Chol/HDL Ratio: 2.7 ratio units (ref 0.0–4.4)
HDL: 62 mg/dL (ref >39)
LDL CALC: 84 mg/dL (ref 0–99)
TRIGLYCERIDES: 94 mg/dL (ref 0–149)
VLDL Cholesterol Cal: 19 mg/dL (ref 5–40)

## 2016-07-04 DIAGNOSIS — Z8544 Personal history of malignant neoplasm of other female genital organs: Secondary | ICD-10-CM | POA: Diagnosis not present

## 2016-07-04 DIAGNOSIS — C52 Malignant neoplasm of vagina: Secondary | ICD-10-CM | POA: Diagnosis not present

## 2016-07-04 DIAGNOSIS — Z08 Encounter for follow-up examination after completed treatment for malignant neoplasm: Secondary | ICD-10-CM | POA: Diagnosis not present

## 2016-07-15 ENCOUNTER — Telehealth: Payer: Self-pay | Admitting: Nurse Practitioner

## 2016-07-15 MED ORDER — ONDANSETRON HCL 4 MG PO TABS: 4.0000 mg | ORAL_TABLET | Freq: Three times a day (TID) | ORAL | 0 refills | Status: DC | PRN

## 2016-07-15 MED ORDER — MECLIZINE HCL 25 MG PO TABS: 25.0000 mg | ORAL_TABLET | Freq: Three times a day (TID) | ORAL | 0 refills | Status: DC | PRN

## 2016-07-15 NOTE — Telephone Encounter (Signed)
Please advise on medicine for dizziness.

## 2016-07-15 NOTE — Telephone Encounter (Signed)
Antivert and zofran Prescription sent to pharmacy

## 2016-07-15 NOTE — Telephone Encounter (Signed)
Aware. 

## 2016-08-08 DIAGNOSIS — S93492A Sprain of other ligament of left ankle, initial encounter: Secondary | ICD-10-CM | POA: Diagnosis not present

## 2016-09-12 DIAGNOSIS — H40013 Open angle with borderline findings, low risk, bilateral: Secondary | ICD-10-CM | POA: Diagnosis not present

## 2016-09-18 ENCOUNTER — Encounter: Payer: Self-pay | Admitting: Family Medicine

## 2016-09-18 ENCOUNTER — Ambulatory Visit (INDEPENDENT_AMBULATORY_CARE_PROVIDER_SITE_OTHER): Payer: Medicare Other | Admitting: Family Medicine

## 2016-09-18 VITALS — BP 126/69 | HR 78 | Temp 97.1°F | Ht 64.0 in | Wt 145.0 lb

## 2016-09-18 DIAGNOSIS — J4521 Mild intermittent asthma with (acute) exacerbation: Secondary | ICD-10-CM | POA: Diagnosis not present

## 2016-09-18 MED ORDER — ALBUTEROL SULFATE HFA 108 (90 BASE) MCG/ACT IN AERS: 2.0000 | INHALATION_SPRAY | Freq: Four times a day (QID) | RESPIRATORY_TRACT | 11 refills | Status: DC | PRN

## 2016-09-18 NOTE — Progress Notes (Signed)
Chief Complaint  Patient presents with  . Asthma    pt here today stating her asthma is acting up and that she hasn't had any problems with it over the past 5 years but has recently been around a lot of smoke and with the hot temperatures she needs an inhaler.    HPI  Patient presents today for A problem new to this provider: Patient feels that exposure to secondhand smoke and the external heat has led to her beginning to wheeze. She feels short of breath as well. There has been mild to moderate cough. She has felt a heaviness in her chest. Primarily it occurred last night. She would like to have a renewal of her upper ureteral inhaler. The symptoms seem to be self-limited. She just went to the sink, placed a hot towel over her head and breathed steam  PMH: Smoking status noted ROS: Per HPI  Objective: BP 126/69   Pulse 78   Temp 97.1 F (36.2 C) (Oral)   Ht 5\' 4"  (1.626 m)   Wt 145 lb (65.8 kg)   LMP 07/09/1975   BMI 24.89 kg/m  Gen: NAD, alert, cooperative with exam HEENT: NCAT, EOMI, PERRL CV: RRR, good S1/S2, no murmur Resp: CTABL, no wheezes, non-labored Abd: SNTND, BS present, no guarding or organomegaly Ext: No edema, warm Neuro: Alert and oriented, No gross deficits  Assessment and plan:  1. Mild intermittent asthma with acute exacerbation     Meds ordered this encounter  Medications  . albuterol (PROVENTIL HFA;VENTOLIN HFA) 108 (90 Base) MCG/ACT inhaler    Sig: Inhale 2 puffs into the lungs every 6 (six) hours as needed for wheezing or shortness of breath.    Dispense:  1 Inhaler    Refill:  11    No orders of the defined types were placed in this encounter.   Follow up as needed.  Claretta Fraise, MD

## 2016-09-19 DIAGNOSIS — J4521 Mild intermittent asthma with (acute) exacerbation: Secondary | ICD-10-CM | POA: Insufficient documentation

## 2016-10-30 ENCOUNTER — Encounter: Payer: Medicare Other | Admitting: *Deleted

## 2016-10-30 DIAGNOSIS — Z1231 Encounter for screening mammogram for malignant neoplasm of breast: Secondary | ICD-10-CM | POA: Diagnosis not present

## 2016-12-07 ENCOUNTER — Ambulatory Visit (INDEPENDENT_AMBULATORY_CARE_PROVIDER_SITE_OTHER): Payer: Medicare Other | Admitting: Nurse Practitioner

## 2016-12-07 ENCOUNTER — Encounter: Payer: Self-pay | Admitting: Nurse Practitioner

## 2016-12-07 VITALS — BP 134/81 | HR 66 | Temp 97.3°F | Ht 64.0 in | Wt 146.0 lb

## 2016-12-07 DIAGNOSIS — J4521 Mild intermittent asthma with (acute) exacerbation: Secondary | ICD-10-CM

## 2016-12-07 DIAGNOSIS — E785 Hyperlipidemia, unspecified: Secondary | ICD-10-CM | POA: Diagnosis not present

## 2016-12-07 DIAGNOSIS — I10 Essential (primary) hypertension: Secondary | ICD-10-CM

## 2016-12-07 DIAGNOSIS — R3915 Urgency of urination: Secondary | ICD-10-CM

## 2016-12-07 MED ORDER — OXYBUTYNIN CHLORIDE 5 MG PO TABS: 5.0000 mg | ORAL_TABLET | Freq: Two times a day (BID) | ORAL | 1 refills | Status: DC

## 2016-12-07 MED ORDER — FLUTICASONE FUROATE-VILANTEROL 100-25 MCG/INH IN AEPB: 1.0000 | INHALATION_SPRAY | Freq: Every day | RESPIRATORY_TRACT | 0 refills | Status: DC

## 2016-12-07 MED ORDER — ENALAPRIL MALEATE 20 MG PO TABS: 20.0000 mg | ORAL_TABLET | Freq: Two times a day (BID) | ORAL | 1 refills | Status: DC

## 2016-12-07 NOTE — Progress Notes (Signed)
Subjective:    Patient ID: Sheri Russell, female    DOB: 11-06-1945, 71 y.o.   MRN: [id number]  HPI  Sheri Russell is here today for follow up of chronic medical problem.  Outpatient Encounter Prescriptions as of 12/07/2016  Medication Sig  . albuterol (PROVENTIL HFA;VENTOLIN HFA) 108 (90 Base) MCG/ACT inhaler Inhale 2 puffs into the lungs every 6 (six) hours as needed for wheezing or shortness of breath.  Marland Kitchen aspirin 81 MG tablet Take 81 mg by mouth daily.  . Biotin 1000 MCG tablet Take 1,000 mcg by mouth 3 (three) times daily.  . enalapril (VASOTEC) 20 MG tablet Take 1 tablet (20 mg total) by mouth 2 (two) times daily.  . fish oil-omega-3 fatty acids 1000 MG capsule Take 2 g by mouth daily.    . hydrochlorothiazide (HYDRODIURIL) 25 MG tablet Take 1 tablet (25 mg total) by mouth daily.  . meclizine (ANTIVERT) 25 MG tablet Take 1 tablet (25 mg total) by mouth 3 (three) times daily as needed for dizziness.  . Multiple Vitamin (MULTIVITAMIN PO) Take by mouth.    . ondansetron (ZOFRAN) 4 MG tablet Take 1 tablet (4 mg total) by mouth every 8 (eight) hours as needed for nausea or vomiting.  Marland Kitchen oxybutynin (DITROPAN) 5 MG tablet Take 1 tablet (5 mg total) by mouth 2 (two) times daily.   No facility-administered encounter medications on file as of 12/07/2016.     1. Essential hypertension, benign  No c/o chest pain, sob or headache. Does not check blood pressure at home  2. Hyperlipidemia with target LDL less than 100  Watches diet some. Very little exercise  3.     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  HENT: Negative.   Eyes: Negative for pain.  Respiratory: Negative for shortness of breath.   Cardiovascular: Negative for chest pain, palpitations and leg  swelling.  Gastrointestinal: Negative for abdominal pain.  Endocrine: Negative for polydipsia.  Genitourinary: Negative.   Skin: Negative for rash.  Neurological: Negative for dizziness, weakness and headaches.  Hematological: Does not bruise/bleed easily.  Psychiatric/Behavioral: Negative.   All other systems reviewed and are negative.      Objective:   Physical Exam  Constitutional: She is oriented to person, place, and time. She appears well-developed and well-nourished.  HENT:  Nose: Nose normal.  Mouth/Throat: Oropharynx is clear and moist.  Eyes: EOM are normal.  Neck: Trachea normal, normal range of motion and full passive range of motion without pain. Neck supple. No JVD present. Carotid bruit is not present. No thyromegaly present.  Cardiovascular: Normal rate, regular rhythm, normal heart sounds and intact distal pulses.  Exam reveals no gallop and no friction rub.   No murmur heard. Pulmonary/Chest: Effort normal. She has wheezes (faint insp whee=zes throughout).  Abdominal: Soft. Bowel sounds are normal. She exhibits no distension and no mass. There is no tenderness.  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  Lymphadenopathy:    She has no cervical adenopathy.  Neurological: She is alert and oriented to person, place, and time. She has normal reflexes.  Skin: Skin is warm and dry.  Psychiatric: She has a normal mood and affect. Her behavior is normal. Judgment  and thought content normal.    BP 134/81   Pulse 66   Temp (!) 97.3 F (36.3 C) (Oral)   Ht _0  (1.626 m)   Wt 146 lb (66.2 kg)   LMP 07/09/1975   BMI 25.06 kg/m        Assessment & Plan:  1. Essential hypertension, benign Low sodium diet - enalapril (VASOTEC) 20 MG tablet; Take 1 tablet (20 mg total) by mouth 2 (two) times daily.  Dispense: 180 tablet; Refill: 1 - CMP14+EGFR  2. Hyperlipidemia with target LDL less than 100 Low fat diet - Lipid  panel  3. Urinary urgency - oxybutynin (DITROPAN) 5 MG tablet; Take 1 tablet (5 mg total) by mouth 2 (two) times daily.  Dispense: 180 tablet; Refill: 1  4. Mild intermittent asthma with acute exacerbation Added BREO - side effects discussed- rinse mouth after use - fluticasone furoate-vilanterol (BREO ELLIPTA) 100-25 MCG/INH AEPB; Inhale 1 puff into the lungs daily.  Dispense: 2 each; Refill: 0    Labs pending Health maintenance reviewed Diet and exercise encouraged  Continue all meds Follow up  In 6 months  [place], FNP

## 2016-12-07 NOTE — Patient Instructions (Signed)
How to Use a Dry Powder Inhaler A dry powder inhaler (DPI) is a device for taking medicine that must be breathed into the lungs (inhaled). The device is also called a disk inhaler. Your doctor will tell you how often to use your inhaler. What are the risks? If you do not use your inhaler correctly, medicine might not get to your lungs to help you breathe. Inhaler medicine can cause side effects, such as:  Mouth infection.  Throat infection.  Cough.  A voice that sounds rough (hoarseness).  Headache.  A feeling that you are going to throw up (nausea).  Throwing up (vomiting).  Lung infection (pneumonia). This can happen if you have chronic obstructive lung (pulmonary) disease (COPD). Supplies needed:  An inhaler. The medicine that you will need is already in the inhaler. How to use a dry powder inhaler 1. Remove any gum or candy from your mouth. 2. Stand or sit up straight. 3. Hold the inhaler in one hand. 4. Put the thumb of your other hand in the thumb grip of the inhaler. Next, push your thumb away from you as far as it will go. 5. Hold the inhaler flat, like a hamburger, with the mouthpiece facing you. 6. Use your other hand to slide the lever that is near the mouthpiece away from you until you hear a click. 7. Turn your head and breathe out. Do not breathe out into the mouthpiece. 8. Turn your head back to the mouthpiece and seal your lips around it. 9. Take a deep breath through your mouth. Do not breathe through your nose. 10. Hold your breath for 10 seconds. 11. Remove the inhaler from your mouth, turn your head, and breathe out slowly. 12. Close the inhaler. To do this, slide the thumb grip back to the closed position. 13. Check the number on the inhaler that shows how much medicine (how many doses) the inhaler has in it. The number should go down by one each time you use the inhaler. 14. Rinse your mouth with water. General recommendations  Do not drop or shake your  inhaler.  Do not wash your inhaler. If the mouthpiece gets dirty, use a dry cloth to wipe it clean.  Do not breathe into the inhaler.  Store your inhaler in a dry place at room temperature. Do not store it in your bathroom.  Keep track of your doses:  When the dose number turns red, you have five doses left. That means it is time to pick up a new inhaler at your pharmacy.  When the dose number shows zero (0), throw away the inhaler. Follow these instructions at home:  Take your inhaled medicine only as told by your doctor.  Keep all follow-up visits as told by your doctor. This is important. Contact a health care provider if:  You have a sore mouth.  You have a sore throat.  You have a lasting (persistent) cough.  Your voice changes.  You have a fever.  You have any of these problems after starting to use your inhaler:  You get headaches often.  You often have a feeling that you are going to throw up.  You throw up often.  Your asthma or COPD symptoms have not improved after you have been using your inhaler for a few weeks. Get help right away if:  You have very bad shortness of breath.  You have trouble breathing. Summary  A dry powder inhaler (DPI) is a device that has medicine in   it that should help you breathe better.  Follow instructions from your doctor about using your inhaler.  Your doctor will prescribe the strength of the medicine and how often you need to inhale it.  Keep your inhaler dry. The medicine in the inhaler must be kept dry.  If you have any questions about how to use your inhaler, talk with your doctor or pharmacist. This information is not intended to replace advice given to you by your health care provider. Make sure you discuss any questions you have with your health care provider. Document Released: 12/28/2007 Document Revised: 12/13/2015 Document Reviewed: 12/13/2015 Elsevier Interactive Patient Education  2017 [employer]  

## 2016-12-08 LAB — LIPID PANEL
CHOLESTEROL TOTAL: 162 mg/dL (ref 100–199)
Chol/HDL Ratio: 2.8 ratio (ref 0.0–4.4)
HDL: 58 mg/dL (ref >39)
LDL Calculated: 75 mg/dL (ref 0–99)
TRIGLYCERIDES: 143 mg/dL (ref 0–149)
VLDL CHOLESTEROL CAL: 29 mg/dL (ref 5–40)

## 2016-12-08 LAB — CMP14+EGFR
A/G RATIO: 1.4 (ref 1.2–2.2)
ALT: 32 IU/L (ref 0–32)
AST: 26 IU/L (ref 0–40)
Albumin: 4.2 g/dL (ref 3.5–4.8)
Alkaline Phosphatase: 74 IU/L (ref 39–117)
BUN/Creatinine Ratio: 15 (ref 12–28)
BUN: 12 mg/dL (ref 8–27)
CALCIUM: 9.7 mg/dL (ref 8.7–10.3)
CO2: 25 mmol/L (ref 20–29)
Chloride: 102 mmol/L (ref 96–106)
Creatinine, Ser: 0.8 mg/dL (ref 0.57–1.00)
GFR calc Af Amer: 86 mL/min/{1.73_m2} (ref >59)
GFR calc non Af Amer: 74 mL/min/{1.73_m2} (ref >59)
Globulin, Total: 3.1 g/dL (ref 1.5–4.5)
Glucose: 101 mg/dL — ABNORMAL HIGH (ref 65–99)
POTASSIUM: 4 mmol/L (ref 3.5–5.2)
Sodium: 141 mmol/L (ref 134–144)
Total Protein: 7.3 g/dL (ref 6.0–8.5)

## 2016-12-31 ENCOUNTER — Other Ambulatory Visit: Payer: Self-pay | Admitting: Family

## 2017-01-02 DIAGNOSIS — Z08 Encounter for follow-up examination after completed treatment for malignant neoplasm: Secondary | ICD-10-CM | POA: Diagnosis not present

## 2017-01-02 DIAGNOSIS — Z8544 Personal history of malignant neoplasm of other female genital organs: Secondary | ICD-10-CM | POA: Diagnosis not present

## 2017-01-02 DIAGNOSIS — C52 Malignant neoplasm of vagina: Secondary | ICD-10-CM | POA: Diagnosis not present

## 2017-01-02 DIAGNOSIS — Z932 Ileostomy status: Secondary | ICD-10-CM | POA: Diagnosis not present

## 2017-01-05 ENCOUNTER — Ambulatory Visit (INDEPENDENT_AMBULATORY_CARE_PROVIDER_SITE_OTHER): Payer: Medicare Other | Admitting: Family Medicine

## 2017-01-05 ENCOUNTER — Encounter: Payer: Self-pay | Admitting: Family Medicine

## 2017-01-05 VITALS — BP 135/72 | HR 108 | Temp 97.5°F | Ht 64.0 in | Wt 143.0 lb

## 2017-01-05 DIAGNOSIS — H66002 Acute suppurative otitis media without spontaneous rupture of ear drum, left ear: Secondary | ICD-10-CM | POA: Diagnosis not present

## 2017-01-05 DIAGNOSIS — J0100 Acute maxillary sinusitis, unspecified: Secondary | ICD-10-CM

## 2017-01-05 MED ORDER — AMOXICILLIN-POT CLAVULANATE 875-125 MG PO TABS: 1.0000 | ORAL_TABLET | Freq: Two times a day (BID) | ORAL | 0 refills | Status: DC

## 2017-01-05 MED ORDER — HYDROCODONE-HOMATROPINE 5-1.5 MG/5ML PO SYRP: 5.0000 mL | ORAL_SOLUTION | Freq: Three times a day (TID) | ORAL | 0 refills | Status: DC | PRN

## 2017-01-05 NOTE — Progress Notes (Signed)
Chief Complaint  Patient presents with  . Cough    pt here today c/o cough, congestion and drainage    HPI  Patient presents today for Patient presents with upper respiratory congestion. Pain in ears and cheeks described as pressure. Rhinorrhea that is occasionally purulent. There is moderate sore throat. Patient reports coughing frequently as well.  Clear sputum noted. There is no fever, chills or sweats. The patient denies being short of breath. Onset was 2 days ago. Gradually worsening. Tried OTC benadryl  without improvement.   PMH: Smoking status noted ROS: Per HPI  Objective: BP 135/72   Pulse (!) 108   Temp (!) 97.5 F (36.4 C) (Oral)   Ht 5\' 4"  (1.626 m)   Wt 143 lb (64.9 kg)   LMP 07/09/1975   BMI 24.55 kg/m  Gen: NAD, alert, cooperative with exam HEENT: NCAT, EOMI, PERRL. Maxillary sinuses tender left TM erythematous with purulence posteriorly CV: RRR, good S1/S2, no murmur Resp: CTABL, no wheezes, non-labored Abd: SNTND, BS present, no guarding or organomegaly Ext: No edema, warm Neuro: Alert and oriented, No gross deficits  Assessment and plan:  1. Acute maxillary sinusitis, recurrence not specified   2. Acute suppurative otitis media of left ear without spontaneous rupture of tympanic membrane, recurrence not specified     Meds ordered this encounter  Medications  . amoxicillin-clavulanate (AUGMENTIN) 875-125 MG tablet    Sig: Take 1 tablet by mouth 2 (two) times daily.    Dispense:  20 tablet    Refill:  0  . HYDROcodone-homatropine (HYCODAN) 5-1.5 MG/5ML syrup    Sig: Take 5 mLs by mouth every 8 (eight) hours as needed for cough.    Dispense:  120 mL    Refill:  0    No orders of the defined types were placed in this encounter.   Follow up as needed.  Claretta Fraise, MD

## 2017-01-08 ENCOUNTER — Encounter: Payer: Self-pay | Admitting: *Deleted

## 2017-01-08 ENCOUNTER — Ambulatory Visit (INDEPENDENT_AMBULATORY_CARE_PROVIDER_SITE_OTHER): Payer: Medicare Other | Admitting: *Deleted

## 2017-01-08 DIAGNOSIS — Z0000 Encounter for general adult medical examination without abnormal findings: Secondary | ICD-10-CM

## 2017-01-08 NOTE — Patient Instructions (Addendum)
Please work on increasing your walking to 30 minutes 3 times per week.  If you would like [place] to file your Advanced Directives on your chart, please bring a copy to our office.    Thank you for coming in for your Annual Wellness Visit today!     Preventive Care 71 Years and Older, Female Preventive care refers to lifestyle choices and visits with your health care provider that can promote health and wellness. What does preventive care include?  A yearly physical exam. This is also called an annual well check.  Dental exams once or twice a year.  Routine eye exams. Ask your health care provider how often you should have your eyes checked.  Personal lifestyle choices, including: ? Daily care of your teeth and gums. ? Regular physical activity. ? Eating a healthy diet. ? Avoiding tobacco and drug use. ? Limiting alcohol use. ? Practicing safe sex. ? Taking low-dose aspirin every day. ? Taking vitamin and mineral supplements as recommended by your health care provider. What happens during an annual well check? The services and screenings done by your health care provider during your annual well check will depend on your age, overall health, lifestyle risk factors, and family history of disease. Counseling Your health care provider may ask you questions about your:  Alcohol use.  Tobacco use.  Drug use.  Emotional well-being.  Home and relationship well-being.  Sexual activity.  Eating habits.  History of falls.  Memory and ability to understand (cognition).  Work and work Statistician.  Reproductive health.  Screening You may have the following tests or measurements:  Height, weight, and BMI.  Blood pressure.  Lipid and cholesterol levels. These may be checked every 5 years, or more frequently if you are over 15 years old.  Skin check.  Lung cancer screening. You may have this screening every year starting at age 58 if you have a 30-pack-year history of  smoking and currently smoke or have quit within the past 15 years.  Fecal occult blood test (FOBT) of the stool. You may have this test every year starting at age 3.  Flexible sigmoidoscopy or colonoscopy. You may have a sigmoidoscopy every 5 years or a colonoscopy every 10 years starting at age 51.  Hepatitis C blood test.  Hepatitis B blood test.  Sexually transmitted disease (STD) testing.  Diabetes screening. This is done by checking your blood sugar (glucose) after you have not eaten for a while (fasting). You may have this done every 1-3 years.  Bone density scan. This is done to screen for osteoporosis. You may have this done starting at age 54.  Mammogram. This may be done every 1-2 years. Talk to your health care provider about how often you should have regular mammograms.  Talk with your health care provider about your test results, treatment options, and if necessary, the need for more tests. Vaccines Your health care provider may recommend certain vaccines, such as:  Influenza vaccine. This is recommended every year.  Tetanus, diphtheria, and acellular pertussis (Tdap, Td) vaccine. You may need a Td booster every 10 years.  Varicella vaccine. You may need this if you have not been vaccinated.  Zoster vaccine. You may need this after age 64.  Measles, mumps, and rubella (MMR) vaccine. You may need at least one dose of MMR if you were born in 1957 or later. You may also need a second dose.  Pneumococcal 13-valent conjugate (PCV13) vaccine. One dose is recommended after age 25.  Pneumococcal polysaccharide (PPSV23) vaccine. One dose is recommended after age 68.  Meningococcal vaccine. You may need this if you have certain conditions.  Hepatitis A vaccine. You may need this if you have certain conditions or if you travel or work in places where you may be exposed to hepatitis A.  Hepatitis B vaccine. You may need this if you have certain conditions or if you travel or  work in places where you may be exposed to hepatitis B.  Haemophilus influenzae type b (Hib) vaccine. You may need this if you have certain conditions.  Talk to your health care provider about which screenings and vaccines you need and how often you need them. This information is not intended to replace advice given to you by your health care provider. Make sure you discuss any questions you have with your health care provider. Document Released: 04/16/2015 Document Revised: 12/08/2015 Document Reviewed: 01/19/2015 Elsevier Interactive Patient Education  2017 [employer].

## 2017-01-08 NOTE — Progress Notes (Signed)
Subjective:   Sheri Russell is a 71 y.o. female who presents for an Initial Medicare Annual Wellness Visit.  Patient was recently treated for acute maxillary sinusitis, and left ear infection.  She has been on antibiotic 01/05/17, and she states her symptoms are resolving.  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  She enjoys doing yard work, spending time with family and friends, and taking care of her 2 chihuahuas.  [name] lives alone.  Her husband passed away in July 09, 2006.  She has 8 siblings, 1 daughter, and 1 great granddaughter who live locally.  She sees her family several times per week.  She has a sister with special needs who lives in an apartment in [place].  [name] provides transportation and spends much of her time with her sister.  She has a history of vaginal and bladder cancers, and she is still follow regularly by [name] in oncology at [facility].  She feels her health is about the same as it was last year.  She reports no hospitalizations, ED visits, or surgeries in the past year.    Review of Systems     Patient reports no pain today Sinus congestion and cough are resolving        Objective:    Today's Vitals   01/08/17 1424  BP: 131/72  Pulse: 79  Weight: 142 lb 9.6 oz (64.7 kg)  Height: 5\' 4"  (1.626 m)  PainSc: 0-No pain   Body mass index is 24.48 kg/m.   Current Medications (verified) Outpatient Encounter Prescriptions as of 01/08/2017  Medication Sig  . albuterol (PROVENTIL HFA;VENTOLIN HFA) 108 (90 Base) MCG/ACT inhaler Inhale 2 puffs into the lungs every 6 (six) hours as needed for wheezing or shortness of breath.  Marland Kitchen amoxicillin-clavulanate (AUGMENTIN) 875-125 MG tablet Take 1 tablet by mouth 2 (two) times daily.  Marland Kitchen aspirin 81 MG tablet Take 81 mg by mouth daily.  . Biotin 1000 MCG tablet Take 1,000 mcg by mouth 3 (three) times daily.  . enalapril (VASOTEC) 20 MG tablet Take 1  tablet (20 mg total) by mouth 2 (two) times daily.  . fish oil-omega-3 fatty acids 1000 MG capsule Take 2 g by mouth daily.    . fluticasone furoate-vilanterol (BREO ELLIPTA) 100-25 MCG/INH AEPB Inhale 1 puff into the lungs daily.  . hydrochlorothiazide (HYDRODIURIL) 25 MG tablet Take 1 tablet (25 mg total) by mouth daily.  Marland Kitchen HYDROcodone-homatropine (HYCODAN) 5-1.5 MG/5ML syrup Take 5 mLs by mouth every 8 (eight) hours as needed for cough.  . meclizine (ANTIVERT) 25 MG tablet Take 1 tablet (25 mg total) by mouth 3 (three) times daily as needed for dizziness.  . Multiple Vitamin (MULTIVITAMIN PO) Take by mouth.    . ondansetron (ZOFRAN) 4 MG tablet TAKE 1 TABLET BY MOUTH EVERY 8 HOURS AS NEEDED FOR NAUSEA AND VOMITING  . oxybutynin (DITROPAN) 5 MG tablet Take 1 tablet (5 mg total) by mouth 2 (two) times daily.   No facility-administered encounter medications on file as of 01/08/2017.     Allergies (verified) Fish allergy; Iohexol; Pneumovax [pneumococcal polysaccharide vaccine]; Iodinated diagnostic agents; and Other   History: Past Medical History:  Diagnosis Date  . Allergic rhinitis   . Anxiety   . Asthma   . Bladder cancer ([place]) 1986  . Cancer [facility]) 1971   pelvic - oncologist -Dr. Paulo Fruit [facility] )  . Cervical cancer ([place])   .  Colon polyps   . Hyperlipidemia   . Hypertension    Past Surgical History:  Procedure Laterality Date  . APPENDECTOMY  1986  . CHOLECYSTECTOMY  1985  . colostomy bag    . Several cancer surgeries  1971, [place]  . TOTAL ABDOMINAL HYSTERECTOMY  1986   Family History  Problem Relation Age of Onset  . Diabetes Mother   . Hypertension Mother   . Heart attack Mother   . Heart disease Mother   . Stroke Mother   . Diabetes Father   . Hypertension Father   . Heart attack Father   . Heart disease Father   . Diabetes Maternal Grandmother   . Hypertension Maternal Grandmother   . Heart attack Maternal Grandmother   . Diabetes  Maternal Grandfather   . Hypertension Maternal Grandfather   . Heart attack Maternal Grandfather   . Diabetes Paternal Grandmother   . Hypertension Paternal Grandmother   . Heart attack Paternal Grandmother   . Diabetes Paternal Grandfather   . Hypertension Paternal Grandfather   . Heart attack Paternal Grandfather   . Stroke Brother   . Asthma Brother    Social History   Occupational History  . Not on file.   Social History Main Topics  . Smoking status: Former Smoker    Types: Cigarettes    Quit date: 1978  . Smokeless tobacco: Never Used  . Alcohol use Yes     Comment: occasionally   . Drug use: No  . Sexual activity: Not on file    Tobacco Counseling Counseling given: No   Activities of Daily Living In your present state of health, do you have any difficulty performing the following activities: 01/08/2017  Hearing? N  Vision? N  Difficulty concentrating or making decisions? N  Walking or climbing stairs? N  Dressing or bathing? N  Doing errands, shopping? N  Some recent data might be hidden    Immunizations and Health Maintenance Immunization History  Administered Date(s) Administered  . Influenza,inj,Quad PF,6+ Mos 01/20/2013, 03/20/2016  . Pneumococcal Conjugate-13 07/29/2014  . Pneumococcal Polysaccharide-23 04/18/2010, 03/20/2016   Health Maintenance Due  Topic Date Due  . INFLUENZA VACCINE  11/01/2016   Deferred influenza vaccine as patient is still recovering from sinus and ear infections and still taking antibiotic for these.  Patient planning to consider getting Shingrix, and may get this vaccine when she returns for her flu shot.     Patient Care Team: [name], FNP as PCP - General (Nurse Practitioner)  Indicate any recent Medical Services you may have received from other than [facility] providers in the past year (date may be approximate). [name] - Hematology and Oncology [facility]       Assessment:   This is a  routine wellness examination for Sheri Russell.   Hearing/Vision screen  No hearing deficits noted Patient states she sees well with her glasses, and follows up with Dr. Radford Pax in [place] yearly.     Dietary issues and exercise activities discussed: Current Exercise Habits: Home exercise routine, Type of exercise: walking, Time (Minutes): 30, Frequency (Times/Week): 2, Weekly Exercise (Minutes/Week): 60, Intensity: Mild  Goals    . Exercise 3x per week (30 min per time)          Encourage increase in walking from 2 to 3 sessions per week for 30 minutes each time.        Recommended diet consisting of mostly lean proteins, fruits and vegetables, and whole grains.  Depression Screen PHQ 2/9 Scores 01/08/2017 12/07/2016 09/18/2016 06/16/2016 03/20/2016 02/05/2015 07/29/2014  PHQ - 2 Score 0 0 0 0 0 0 1    Fall Risk Fall Risk  01/08/2017 12/07/2016 06/16/2016 03/20/2016 03/10/2016  Falls in the past year? No Yes No No No  Comment - - - - Emmi Telephone Survey: data to providers prior to load  Number falls in past yr: - 2 or more - - -  Injury with Fall? - Yes - - -  Comment - knee and shoulder - - -    Cognitive Function: MMSE - Mini Mental State Exam 01/08/2017  Orientation to time 5  Orientation to Place 5  Registration 3  Attention/ Calculation 5  Recall 3  Language- name 2 objects 2  Language- repeat 1  Language- follow 3 step command 3  Language- read & follow direction 1  Write a sentence 1  Copy design 1  Total score 30        Screening Tests Health Maintenance  Topic Date Due  . INFLUENZA VACCINE  11/01/2016  . DEXA SCAN  06/06/2017 (Originally 06/07/2010)  . MAMMOGRAM  10/30/2017  . TETANUS/TDAP  05/04/2020  . Hepatitis C Screening  Completed  . PNA vac Low Risk Adult  Completed   Influenza vaccine deferred today due to recent sinus and ear infections.     Plan:     Work on increasing walking to 30 minutes 3 times per week. Please bring a copy of Advanced  Directives to our office if you would like them filed on your chart. Please return in 1-2 weeks for your influenza once you are feeling better.  You may also get the Shingrix vaccine that day if you decide you would like it.   I have personally reviewed and noted the following in the patient's chart:   . Medical and social history . Use of alcohol, tobacco or illicit drugs  . Current medications and supplements . Functional ability and status . Nutritional status . Physical activity . Advanced directives . List of other physicians . Hospitalizations, surgeries, and ER visits in previous 12 months . Vitals . Screenings to include cognitive, depression, and falls . Referrals and appointments  In addition, I have reviewed and discussed with patient certain preventive protocols, quality metrics, and best practice recommendations. A written personalized care plan for preventive services as well as general preventive health recommendations were provided to patient.     Chessa Barrasso M, RN   01/08/2017   I have reviewed and agree with the above AWV documentation.   Assunta Found, MD [place]

## 2017-01-29 ENCOUNTER — Ambulatory Visit: Payer: Medicare Other

## 2017-01-31 ENCOUNTER — Ambulatory Visit (INDEPENDENT_AMBULATORY_CARE_PROVIDER_SITE_OTHER): Payer: Medicare Other

## 2017-01-31 DIAGNOSIS — Z23 Encounter for immunization: Secondary | ICD-10-CM | POA: Diagnosis not present

## 2017-02-20 ENCOUNTER — Telehealth: Payer: Self-pay | Admitting: Nurse Practitioner

## 2017-02-20 NOTE — Telephone Encounter (Signed)
Left message for patient to call to schedule an appointment for assessment of infection.

## 2017-02-21 ENCOUNTER — Ambulatory Visit: Payer: Medicare Other | Admitting: Family

## 2017-02-21 ENCOUNTER — Encounter: Payer: Self-pay | Admitting: Family Medicine

## 2017-02-21 ENCOUNTER — Ambulatory Visit (INDEPENDENT_AMBULATORY_CARE_PROVIDER_SITE_OTHER): Payer: Medicare Other | Admitting: Family Medicine

## 2017-02-21 VITALS — BP 131/71 | HR 92 | Temp 97.0°F | Ht 64.0 in | Wt 144.0 lb

## 2017-02-21 DIAGNOSIS — N3090 Cystitis, unspecified without hematuria: Secondary | ICD-10-CM

## 2017-02-21 DIAGNOSIS — R399 Unspecified symptoms and signs involving the genitourinary system: Secondary | ICD-10-CM | POA: Diagnosis not present

## 2017-02-21 LAB — URINALYSIS
Bilirubin, UA: NEGATIVE
GLUCOSE, UA: NEGATIVE
KETONES UA: NEGATIVE
NITRITE UA: NEGATIVE
PROTEIN UA: NEGATIVE
Urobilinogen, Ur: 0.2 mg/dL (ref 0.2–1.0)
pH, UA: 5.5 (ref 5.0–7.5)

## 2017-02-21 MED ORDER — SULFAMETHOXAZOLE-TRIMETHOPRIM 800-160 MG PO TABS: 1.0000 | ORAL_TABLET | Freq: Two times a day (BID) | ORAL | 0 refills | Status: DC

## 2017-02-21 NOTE — Progress Notes (Signed)
Chief Complaint  Patient presents with  . Dysuria    pt here today c/o discomfort with urination    HPI  Patient presents today for burning with urination and frequency for several days. Denies fever . No flank pain. No nausea, vomiting. Has hx of bladder Ca, colostomy and radiation in the 1980s.    PMH: Smoking status noted ROS: Per HPI  Objective: BP 131/71   Pulse 92   Temp (!) 97 F (36.1 C) (Oral)   Ht 5\' 4"  (1.626 m)   Wt 144 lb (65.3 kg)   LMP 07/09/1975   BMI 24.72 kg/m  Gen: NAD, alert, cooperative with exam HEENT: NCAT, EOMI, PERRL Abd: SNTND, BS present, no guarding or organomegaly Ext: No edema, warm Neuro: Alert and oriented, No gross deficits  Assessment and plan:  1. Cystitis     Meds ordered this encounter  Medications  . sulfamethoxazole-trimethoprim (BACTRIM DS,SEPTRA DS) 800-160 MG tablet    Sig: Take 1 tablet by mouth 2 (two) times daily.    Dispense:  14 tablet    Refill:  0    Orders Placed This Encounter  Procedures  . Urine Culture  . Urinalysis    Follow up as needed.  Claretta Fraise, MD

## 2017-02-25 LAB — URINE CULTURE

## 2017-02-28 ENCOUNTER — Other Ambulatory Visit: Payer: Self-pay | Admitting: Nurse Practitioner

## 2017-02-28 ENCOUNTER — Other Ambulatory Visit: Payer: Self-pay | Admitting: Family

## 2017-02-28 DIAGNOSIS — D1801 Hemangioma of skin and subcutaneous tissue: Secondary | ICD-10-CM | POA: Diagnosis not present

## 2017-02-28 DIAGNOSIS — L821 Other seborrheic keratosis: Secondary | ICD-10-CM | POA: Diagnosis not present

## 2017-02-28 DIAGNOSIS — L820 Inflamed seborrheic keratosis: Secondary | ICD-10-CM | POA: Diagnosis not present

## 2017-02-28 DIAGNOSIS — D225 Melanocytic nevi of trunk: Secondary | ICD-10-CM | POA: Diagnosis not present

## 2017-03-01 ENCOUNTER — Other Ambulatory Visit: Payer: Self-pay | Admitting: Family Medicine

## 2017-04-02 LAB — STATE LABORATORY REPORT

## 2017-04-04 ENCOUNTER — Other Ambulatory Visit: Payer: Medicare Other

## 2017-04-04 ENCOUNTER — Other Ambulatory Visit: Payer: Self-pay | Admitting: Family Medicine

## 2017-04-04 ENCOUNTER — Other Ambulatory Visit: Payer: Self-pay | Admitting: *Deleted

## 2017-04-04 DIAGNOSIS — Z87440 Personal history of urinary (tract) infections: Secondary | ICD-10-CM | POA: Diagnosis not present

## 2017-04-04 DIAGNOSIS — N390 Urinary tract infection, site not specified: Secondary | ICD-10-CM | POA: Diagnosis not present

## 2017-04-05 LAB — URINE CULTURE

## 2017-05-04 ENCOUNTER — Ambulatory Visit: Payer: Medicare Other | Admitting: *Deleted

## 2017-06-11 ENCOUNTER — Ambulatory Visit (INDEPENDENT_AMBULATORY_CARE_PROVIDER_SITE_OTHER): Payer: Medicare Other | Admitting: Nurse Practitioner

## 2017-06-11 ENCOUNTER — Encounter: Payer: Self-pay | Admitting: Nurse Practitioner

## 2017-06-11 VITALS — BP 123/75 | Temp 96.9°F | Ht 64.0 in | Wt 144.0 lb

## 2017-06-11 DIAGNOSIS — E785 Hyperlipidemia, unspecified: Secondary | ICD-10-CM

## 2017-06-11 DIAGNOSIS — I10 Essential (primary) hypertension: Secondary | ICD-10-CM

## 2017-06-11 DIAGNOSIS — J4521 Mild intermittent asthma with (acute) exacerbation: Secondary | ICD-10-CM

## 2017-06-11 DIAGNOSIS — R3915 Urgency of urination: Secondary | ICD-10-CM

## 2017-06-11 DIAGNOSIS — C52 Malignant neoplasm of vagina: Secondary | ICD-10-CM

## 2017-06-11 MED ORDER — OXYBUTYNIN CHLORIDE 5 MG PO TABS: 5.0000 mg | ORAL_TABLET | Freq: Two times a day (BID) | ORAL | 1 refills | Status: DC

## 2017-06-11 MED ORDER — HYDROCHLOROTHIAZIDE 25 MG PO TABS: 25.0000 mg | ORAL_TABLET | Freq: Every day | ORAL | 3 refills | Status: DC

## 2017-06-11 MED ORDER — ENALAPRIL MALEATE 20 MG PO TABS: 20.0000 mg | ORAL_TABLET | Freq: Two times a day (BID) | ORAL | 1 refills | Status: DC

## 2017-06-11 NOTE — Progress Notes (Signed)
Subjective:    Patient ID: Sheri Russell, female    DOB: 03/19/1946, 72 y.o.   MRN: [id number]  HPI  Sheri Russell is here today for follow up of chronic medical problem.  Outpatient Encounter Medications as of 06/11/2017  Medication Sig  . albuterol (PROVENTIL HFA;VENTOLIN HFA) 108 (90 Base) MCG/ACT inhaler Inhale 2 puffs into the lungs every 6 (six) hours as needed for wheezing or shortness of breath.  . enalapril (VASOTEC) 20 MG tablet Take 1 tablet (20 mg total) by mouth 2 (two) times daily.  . hydrochlorothiazide (HYDRODIURIL) 25 MG tablet Take 1 tablet (25 mg total) by mouth daily.  . meclizine (ANTIVERT) 25 MG tablet TAKE 1 TABLET BY MOUTH THREE TIMES DAILY AS NEEDED FOR  DIZZINESS  . Multiple Vitamin (MULTIVITAMIN PO) Take by mouth.    . ondansetron (ZOFRAN) 4 MG tablet TAKE 1 TABLET BY MOUTH EVERY 8 HOURS AS NEEDED FOR NAUSEA AND VOMITING  . oxybutynin (DITROPAN) 5 MG tablet Take 1 tablet (5 mg total) by mouth 2 (two) times daily.     1. Essential hypertension, benign  No c/o chest pain, sob or headache. Does not check blood pressure at home. BP Readings from Last 3 Encounters:  02/21/17 131/71  01/08/17 131/72  01/05/17 135/72     2. Mild intermittent asthma with acute exacerbation  Has been doing well. Has not needed inhaler  3. Hyperlipidemia with target LDL less than 100  Has to watch diet because of hr colostomy bag.    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  HENT: Negative.   Eyes: Negative for pain.  Respiratory: Negative for shortness of breath.   Cardiovascular: Negative for chest pain, palpitations and leg swelling.  Gastrointestinal: Negative for abdominal pain.  Endocrine: Negative for polydipsia.  Genitourinary: Negative.   Skin: Negative for rash.  Neurological: Negative for dizziness, weakness and  headaches.  Hematological: Does not bruise/bleed easily.  Psychiatric/Behavioral: Negative.   All other systems reviewed and are negative.      Objective:   Physical Exam  Constitutional: She is oriented to person, place, and time. She appears well-developed and well-nourished.  HENT:  Nose: Nose normal.  Mouth/Throat: Oropharynx is clear and moist.  Eyes: EOM are normal.  Neck: Trachea normal, normal range of motion and full passive range of motion without pain. Neck supple. No JVD present. Carotid bruit is not present. No thyromegaly present.  Cardiovascular: Normal rate, regular rhythm, normal heart sounds and intact distal pulses. Exam reveals no gallop and no friction rub.  No murmur heard. Pulmonary/Chest: Effort normal and breath sounds normal.  Abdominal: Soft. Bowel sounds are normal. She exhibits no distension and no mass. There is no tenderness.  Musculoskeletal: Normal range of motion.  Lymphadenopathy:    She has no cervical adenopathy.  Neurological: She is alert and oriented to person, place, and time. She has normal reflexes.  Skin: Skin is warm and dry.  Psychiatric: She has a normal mood and affect. Her behavior is normal. Judgment and thought content normal.   BP 123/75   Temp (!) 96.9 F (36.1 C) (Oral)   Ht 5' 4"  (1.626 m)   Wt 144 lb (65.3 kg)   LMP 07/09/1975   BMI 24.72 kg/m       Assessment & Plan:  1. Essential hypertension, benign Low sodium diet -  enalapril (VASOTEC) 20 MG tablet; Take 1 tablet (20 mg total) by mouth 2 (two) times daily.  Dispense: 180 tablet; Refill: 1 - hydrochlorothiazide (HYDRODIURIL) 25 MG tablet; Take 1 tablet (25 mg total) by mouth daily.  Dispense: 90 tablet; Refill: 3 - CMP14+EGFR  2. Mild intermittent asthma with acute exacerbation Avoid cigarette smoke  3. Hyperlipidemia with target LDL less than 100 Low fat diet - Lipid panel  4. Cancer of vagina ([place])  5. Urinary urgency - oxybutynin (DITROPAN) 5 MG  tablet; Take 1 tablet (5 mg total) by mouth 2 (two) times daily.  Dispense: 180 tablet; Refill: 1    Labs pending Health maintenance reviewed Diet and exercise encouraged Continue all meds Follow up  In 6 months   [place], FNP

## 2017-06-11 NOTE — Patient Instructions (Signed)
Colostomy, Adult, Care After Refer to this sheet in the next few weeks. These instructions provide you with information about caring for yourself after your procedure. Your health care provider may also give you more specific instructions. Your treatment has been planned according to current medical practices, but problems sometimes occur. Call your health care provider if you have any problems or questions after your procedure. What can I expect after the procedure? After the procedure, it is common to have:  Swelling at the opening that was created during the procedure (stoma).  Slight bleeding around the stoma.  Redness around the stoma.  Follow these instructions at home: Activity  Rest as needed while the stoma area heals.  Return to your normal activities as told by your health care provider. Ask your health care provider what activities are safe for you.  Avoid strenuous activity and abdominal exercises for 3 weeks or for as long as told by your health care provider.  Do not lift anything that is heavier than 10 lb (4.5 kg). Incision care   Follow instructions from your health care provider about how to take care of your incision. Make sure you: ? Wash your hands with soap and water before you change your bandage (dressing). If soap and water are not available, use hand sanitizer. ? Change your dressing as told by your health care provider. ? Leave stitches (sutures), skin glue, or adhesive strips in place. These skin closures may need to stay in place for 2 weeks or longer. If adhesive strip edges start to loosen and curl up, you may trim the loose edges. Do not remove adhesive strips completely unless your health care provider tells you to do that. Stoma Care  Keep the stoma area clean.  Clean and dry the skin around the stoma each time you change the colostomy bag. To clean the stoma area: ? Use warm water and only use cleansers that are recommended by your health care  provider. ? Rinse the stoma area with plain water. ? Dry the area well.  Use stoma powder or ointment on your skin only as told by your health care provider. Do not use any other powders, gels, wipes, or creams on your skin.  Check the stoma area every day for signs of infection. Check for: ? More redness, swelling, or pain. ? More fluid or blood. ? Pus or warmth.  Measure the stoma opening regularly and record the size. Watch for changes. Share this information with your health care provider. Bathing  Do not take baths, swim, or use a hot tub until your health care provider approves. Ask your health care provider if you can take showers. You may be able to shower with or without the colostomy bag in place. If you bathe with the bag on, dry the bag afterward.  Avoid using harsh or oily soaps when you bathe. Colostomy Bag Care  Follow instructions from your health care provider about how to empty or change the colostomy bag.  Keep colostomy supplies with you at all times.  Store all supplies in a cool, dry place.  Empty the colostomy bag: ? Whenever it is one-third to one-half full. ? At bedtime.  Replace the bag every 2-4 days or as told by your health care provider. Driving  Do not drive for 24 hours if you received a sedative.  Do not drive or operate heavy machinery while taking prescription pain medicine. General instructions  Follow instructions from your health care provider about eating  or drinking restrictions.  Take over-the-counter and prescription medicines only as told by your health care provider.  Avoid wearing clothes that are tight directly over your stoma.  Do not use any tobacco products, such as cigarettes, chewing tobacco, and e-cigarettes. If you need help quitting, ask your health care provider.  (Women) Ask your health care provider about becoming pregnant and about using birth control. Medicines may not be absorbed normally after the  procedure.  Keep all follow-up visits as told by your health care provider. This is important. Contact a health care provider if:  You are having trouble caring for your stoma or changing the colostomy bag.  You feel nauseous or you vomit.  You have a fever.  You havemore redness, swelling, or pain at the site of your stoma or around your anus.  You have more fluid or blood coming from your stoma or your anus.  Your stoma area feels warm to the touch.  You have pus coming from your stoma.  You notice a change in the size or appearance of the stoma.  You have abdominal pain, bloating, pressure, or cramping.  Your have stool more often or less often than your health care provider tells you to expect.  You are not making much urine. This may be a sign of dehydration. Get help right away if:  Your abdominal pain does not go away or it becomes severe.  You keep vomiting.  Your stool is not draining through the stoma.  You have chest pain or an irregular heartbeat. This information is not intended to replace advice given to you by your health care provider. Make sure you discuss any questions you have with your health care provider. Document Released: 08/10/2010 Document Revised: 07/29/2015 Document Reviewed: 12/01/2014 Elsevier Interactive Patient Education  2018 [employer].

## 2017-06-12 LAB — CMP14+EGFR
ALK PHOS: 69 IU/L (ref 39–117)
ALT: 32 IU/L (ref 0–32)
AST: 26 IU/L (ref 0–40)
Albumin/Globulin Ratio: 1.5 (ref 1.2–2.2)
Albumin: 4.5 g/dL (ref 3.5–4.8)
BUN/Creatinine Ratio: 17 (ref 12–28)
BUN: 14 mg/dL (ref 8–27)
Bilirubin Total: 0.3 mg/dL (ref 0.0–1.2)
CHLORIDE: 105 mmol/L (ref 96–106)
CO2: 22 mmol/L (ref 20–29)
Calcium: 9.5 mg/dL (ref 8.7–10.3)
Creatinine, Ser: 0.81 mg/dL (ref 0.57–1.00)
GFR calc Af Amer: 84 mL/min/{1.73_m2} (ref >59)
GFR calc non Af Amer: 73 mL/min/{1.73_m2} (ref >59)
GLUCOSE: 88 mg/dL (ref 65–99)
Globulin, Total: 3 g/dL (ref 1.5–4.5)
POTASSIUM: 4.2 mmol/L (ref 3.5–5.2)
Sodium: 144 mmol/L (ref 134–144)
Total Protein: 7.5 g/dL (ref 6.0–8.5)

## 2017-06-12 LAB — LIPID PANEL
CHOLESTEROL TOTAL: 182 mg/dL (ref 100–199)
Chol/HDL Ratio: 2.4 ratio (ref 0.0–4.4)
HDL: 75 mg/dL (ref >39)
LDL Calculated: 89 mg/dL (ref 0–99)
TRIGLYCERIDES: 88 mg/dL (ref 0–149)
VLDL CHOLESTEROL CAL: 18 mg/dL (ref 5–40)

## 2017-10-22 ENCOUNTER — Other Ambulatory Visit: Payer: Self-pay | Admitting: Nurse Practitioner

## 2017-11-13 DIAGNOSIS — K6289 Other specified diseases of anus and rectum: Secondary | ICD-10-CM | POA: Diagnosis not present

## 2017-12-14 DIAGNOSIS — C52 Malignant neoplasm of vagina: Secondary | ICD-10-CM | POA: Diagnosis not present

## 2017-12-14 DIAGNOSIS — N823 Fistula of vagina to large intestine: Secondary | ICD-10-CM | POA: Diagnosis not present

## 2017-12-17 ENCOUNTER — Other Ambulatory Visit: Payer: Self-pay | Admitting: Family Medicine

## 2017-12-18 ENCOUNTER — Ambulatory Visit (INDEPENDENT_AMBULATORY_CARE_PROVIDER_SITE_OTHER): Payer: Medicare Other | Admitting: Nurse Practitioner

## 2017-12-18 ENCOUNTER — Encounter: Payer: Self-pay | Admitting: Nurse Practitioner

## 2017-12-18 VITALS — BP 138/84 | HR 90 | Temp 97.3°F | Ht 64.0 in | Wt 145.0 lb

## 2017-12-18 DIAGNOSIS — J4521 Mild intermittent asthma with (acute) exacerbation: Secondary | ICD-10-CM | POA: Diagnosis not present

## 2017-12-18 DIAGNOSIS — E785 Hyperlipidemia, unspecified: Secondary | ICD-10-CM | POA: Diagnosis not present

## 2017-12-18 DIAGNOSIS — J069 Acute upper respiratory infection, unspecified: Secondary | ICD-10-CM

## 2017-12-18 DIAGNOSIS — I10 Essential (primary) hypertension: Secondary | ICD-10-CM

## 2017-12-18 DIAGNOSIS — R3915 Urgency of urination: Secondary | ICD-10-CM

## 2017-12-18 DIAGNOSIS — Z13820 Encounter for screening for osteoporosis: Secondary | ICD-10-CM | POA: Diagnosis not present

## 2017-12-18 MED ORDER — HYDROCHLOROTHIAZIDE 25 MG PO TABS: 25.0000 mg | ORAL_TABLET | Freq: Every day | ORAL | 3 refills | Status: DC

## 2017-12-18 MED ORDER — ENALAPRIL MALEATE 20 MG PO TABS: 20.0000 mg | ORAL_TABLET | Freq: Two times a day (BID) | ORAL | 1 refills | Status: DC

## 2017-12-18 MED ORDER — BENZONATATE 100 MG PO CAPS: 100.0000 mg | ORAL_CAPSULE | Freq: Three times a day (TID) | ORAL | 0 refills | Status: DC | PRN

## 2017-12-18 NOTE — Telephone Encounter (Signed)
Last seen 06/11/17  MMM

## 2017-12-18 NOTE — Patient Instructions (Signed)

## 2017-12-18 NOTE — Progress Notes (Signed)
Subjective:    Patient ID: Sheri Russell, female    DOB: 26-Oct-1945, 72 y.o.   MRN: [id number]   Chief Complaint: Medical Management of Chronic Issues   HPI:  1. Essential hypertension, benign  -checks BP at home -average readings about 130s/80s -no problems with vasotec and hctz   2. Mild intermittent asthma with acute exacerbation  -uses albuterol about 2x/day -no asthma attacks   3. Hyperlipidemia with target LDL less than 100  -does not watch diet -walks her two dogs daily   4. Urinary urgency  -no issues    Outpatient Encounter Medications as of 12/18/2017  Medication Sig  . albuterol (PROVENTIL HFA;VENTOLIN HFA) 108 (90 Base) MCG/ACT inhaler INHALE 2 PUFFS BY MOUTH EVERY 6 HOURS AS NEEDED FOR WHEEZING OR SHORTNESS OF BREATH  . enalapril (VASOTEC) 20 MG tablet Take 1 tablet (20 mg total) by mouth 2 (two) times daily.  . hydrochlorothiazide (HYDRODIURIL) 25 MG tablet Take 1 tablet (25 mg total) by mouth daily.  . meclizine (ANTIVERT) 25 MG tablet TAKE 1 TABLET BY MOUTH THREE TIMES DAILY AS NEEDED FOR  DIZZINESS  . ondansetron (ZOFRAN) 4 MG tablet TAKE 1 TABLET BY MOUTH EVERY 8 HOURS AS NEEDED FOR NAUSEA AND VOMITING  . ondansetron (ZOFRAN) 4 MG tablet TAKE 1 TABLET BY MOUTH EVERY 8 HOURS AS NEEDED FOR NAUSEA AND VOMITING  . [DISCONTINUED] Multiple Vitamin (MULTIVITAMIN PO) Take by mouth.    . [DISCONTINUED] oxybutynin (DITROPAN) 5 MG tablet Take 1 tablet (5 mg total) by mouth 2 (two) times daily.   No facility-administered encounter medications on file as of 12/18/2017.     New complaints: Sinus issues and hay fever with cough.  Started about 10 days ago, pain across forehead with congestion, nasal drainage, dry cough.  Benadryl at night knocks her out. 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  HENT: Positive for congestion, rhinorrhea, sinus pressure and sinus pain. Negative for ear discharge and ear pain.   Eyes: Negative for discharge and itching.  Respiratory: Positive for cough. Negative for shortness of breath.   Cardiovascular: Negative for chest pain and palpitations.  Gastrointestinal: Negative for abdominal pain and nausea.  Endocrine: Negative for cold intolerance and heat intolerance.  Genitourinary: Negative for dysuria and frequency.  Musculoskeletal: Negative for back pain and myalgias.  Skin: Negative for rash and wound.  Allergic/Immunologic: Positive for environmental allergies and food allergies (see allergy list).  Neurological: Negative for dizziness and headaches.  Hematological: Does not bruise/bleed easily.  Psychiatric/Behavioral: Positive for sleep disturbance (racing thoughts at night, sleeps 7 hours at night). The patient is not nervous/anxious.        Objective:   Physical Exam  Constitutional: She is oriented to person, place, and time. She appears well-developed and well-nourished.  HENT:  Head: Normocephalic and atraumatic.  Right Ear: Tympanic membrane, external ear and ear canal normal.  Left Ear: Tympanic membrane and external ear normal.  Nose: Nose normal.  Mouth/Throat: Uvula is midline, oropharynx is clear and moist and mucous membranes are normal. She has dentures (upper).  Mild erythema in left ear canal   Eyes: Pupils are equal, round, and reactive to light. Conjunctivae, EOM and lids are normal.  Neck: Normal range of motion. Neck supple. No thyromegaly present.  Cardiovascular: Normal rate,  normal heart sounds and intact distal pulses.  Pulmonary/Chest: Effort normal. She has wheezes (RLQ) in the right lower field.  Abdominal: Soft. Bowel sounds are normal. There is no tenderness.    LUQ colostomy, stoma pink, surrounding skin intact. Changes 1-piece bag every 2 days  Musculoskeletal: Normal range of  motion.  Neurological: She is alert and oriented to person, place, and time. She has normal strength and normal reflexes. No cranial nerve deficit.  Skin: Skin is warm and dry.  Psychiatric: She has a normal mood and affect. Her behavior is normal. Judgment and thought content normal.     BP 138/84   Pulse 90   Temp (!) 97.3 F (36.3 C) (Oral)   Ht _0  (1.626 m)   Wt 145 lb (65.8 kg)   LMP 07/09/1975   BMI 24.89 kg/m       Assessment & Plan:  Sheri Russell comes in today with chief complaint of Medical Management of Chronic Issues   Diagnosis and orders addressed:  1. Essential hypertension, benign  - hydrochlorothiazide (HYDRODIURIL) 25 MG tablet; Take 1 tablet (25 mg total) by mouth daily.  Dispense: 90 tablet; Refill: 3 - enalapril (VASOTEC) 20 MG tablet; Take 1 tablet (20 mg total) by mouth 2 (two) times daily.  Dispense: 180 tablet; Refill: 1 - CMP14+EGFR -low salt diet  2. Mild intermittent asthma with acute exacerbation -avoid triggers -continue albuterol inhaler as needed  3. Hyperlipidemia with target LDL less than 100 - Lipid panel -low fat diet  4. Urinary urgency  5. URI with cough and congestion  - benzonatate (TESSALON PERLES) 100 MG capsule; Take 1 capsule (100 mg total) by mouth 3 (three) times daily as needed for cough.  Dispense: 20 capsule; Refill: 0 -cool mist humidifier -saline nasal spray as needed  6. Screening for osteoporosis -schedule DEXA scan  Needs to schedule mammogram  Labs pending Health Maintenance reviewed Diet and exercise encouraged  Follow up plan: 3 months   Mary-Margaret Hassell Done, FNP

## 2017-12-19 LAB — LIPID PANEL
CHOLESTEROL TOTAL: 156 mg/dL (ref 100–199)
Chol/HDL Ratio: 2.8 ratio (ref 0.0–4.4)
HDL: 56 mg/dL (ref >39)
LDL CALC: 57 mg/dL (ref 0–99)
TRIGLYCERIDES: 216 mg/dL — AB (ref 0–149)
VLDL CHOLESTEROL CAL: 43 mg/dL — AB (ref 5–40)

## 2017-12-19 LAB — CMP14+EGFR
ALBUMIN: 4.3 g/dL (ref 3.5–4.8)
ALK PHOS: 77 IU/L (ref 39–117)
ALT: 25 IU/L (ref 0–32)
AST: 19 IU/L (ref 0–40)
Albumin/Globulin Ratio: 1.4 (ref 1.2–2.2)
BILIRUBIN TOTAL: 0.2 mg/dL (ref 0.0–1.2)
BUN/Creatinine Ratio: 16 (ref 12–28)
BUN: 13 mg/dL (ref 8–27)
CHLORIDE: 102 mmol/L (ref 96–106)
CO2: 27 mmol/L (ref 20–29)
CREATININE: 0.82 mg/dL (ref 0.57–1.00)
Calcium: 9.2 mg/dL (ref 8.7–10.3)
GFR calc Af Amer: 83 mL/min/{1.73_m2} (ref >59)
GFR calc non Af Amer: 72 mL/min/{1.73_m2} (ref >59)
GLUCOSE: 96 mg/dL (ref 65–99)
Globulin, Total: 3 g/dL (ref 1.5–4.5)
Potassium: 3.9 mmol/L (ref 3.5–5.2)
Sodium: 145 mmol/L — ABNORMAL HIGH (ref 134–144)
Total Protein: 7.3 g/dL (ref 6.0–8.5)

## 2017-12-28 DIAGNOSIS — Z91041 Radiographic dye allergy status: Secondary | ICD-10-CM | POA: Diagnosis not present

## 2017-12-28 DIAGNOSIS — Z8544 Personal history of malignant neoplasm of other female genital organs: Secondary | ICD-10-CM | POA: Diagnosis not present

## 2017-12-28 DIAGNOSIS — N823 Fistula of vagina to large intestine: Secondary | ICD-10-CM | POA: Diagnosis not present

## 2017-12-28 DIAGNOSIS — Z79899 Other long term (current) drug therapy: Secondary | ICD-10-CM | POA: Diagnosis not present

## 2017-12-28 DIAGNOSIS — J45909 Unspecified asthma, uncomplicated: Secondary | ICD-10-CM | POA: Diagnosis not present

## 2017-12-28 DIAGNOSIS — Z923 Personal history of irradiation: Secondary | ICD-10-CM | POA: Diagnosis not present

## 2017-12-28 DIAGNOSIS — Z91013 Allergy to seafood: Secondary | ICD-10-CM | POA: Diagnosis not present

## 2017-12-28 DIAGNOSIS — I10 Essential (primary) hypertension: Secondary | ICD-10-CM | POA: Diagnosis not present

## 2017-12-28 DIAGNOSIS — Z8589 Personal history of malignant neoplasm of other organs and systems: Secondary | ICD-10-CM | POA: Diagnosis not present

## 2017-12-28 DIAGNOSIS — K624 Stenosis of anus and rectum: Secondary | ICD-10-CM | POA: Diagnosis not present

## 2017-12-28 DIAGNOSIS — Z91018 Allergy to other foods: Secondary | ICD-10-CM | POA: Diagnosis not present

## 2018-01-14 ENCOUNTER — Encounter: Payer: Self-pay | Admitting: *Deleted

## 2018-01-14 ENCOUNTER — Ambulatory Visit (INDEPENDENT_AMBULATORY_CARE_PROVIDER_SITE_OTHER): Payer: Medicare Other | Admitting: *Deleted

## 2018-01-14 VITALS — BP 120/76 | HR 68 | Ht 63.0 in | Wt 145.0 lb

## 2018-01-14 DIAGNOSIS — Z23 Encounter for immunization: Secondary | ICD-10-CM

## 2018-01-14 DIAGNOSIS — Z0000 Encounter for general adult medical examination without abnormal findings: Secondary | ICD-10-CM

## 2018-01-14 NOTE — Progress Notes (Signed)
Subjective:   Sheri Russell is a 72 y.o. female who presents for a Medicare Annual Wellness Visit. Sheri Russell lives at home alone since her husband passed away in 06-29-2006. She has one daughter and one grandchild. Her daughter is a Designer, jewellery. She works part time sitting with someone at night and she stays active around her home and yard. She has dogs that she walks at least 3 times a day.    Review of Systems    Patient reports that her overall health is unchanged compared to last year.  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 albuterol (PROVENTIL HFA;VENTOLIN HFA) 108 (90 Base) MCG/ACT inhaler INHALE 2 PUFFS BY MOUTH EVERY 6 HOURS AS NEEDED FOR WHEEZING OR SHORTNESS OF BREATH  . enalapril (VASOTEC) 20 MG tablet Take 1 tablet (20 mg total) by mouth 2 (two) times daily.  . hydrochlorothiazide (HYDRODIURIL) 25 MG tablet Take 1 tablet (25 mg total) by mouth daily.  . meclizine (ANTIVERT) 25 MG tablet TAKE 1 TABLET BY MOUTH THREE TIMES DAILY AS NEEDED FOR  DIZZINESS  . ondansetron (ZOFRAN) 4 MG tablet TAKE 1 TABLET BY MOUTH EVERY 8 HOURS AS NEEDED FOR NAUSEA AND VOMITING  . benzonatate (TESSALON PERLES) 100 MG capsule Take 1 capsule (100 mg total) by mouth 3 (three) times daily as needed for cough. (Patient not taking: Reported on 01/14/2018)   No facility-administered encounter medications on file as of 01/14/2018.     Allergies (verified) Fish allergy; Iohexol; Pneumovax [pneumococcal polysaccharide vaccine]; Iodinated diagnostic agents; and Other   History: Past Medical History:  Diagnosis Date  . Allergic rhinitis   . Anxiety   . Asthma   . Bladder cancer ([place]) 1986  . Cancer [facility]) 1971   pelvic -  oncologist -Dr. Paulo Fruit [facility] )  . Cervical cancer ([place])   . Colon polyps   . Hyperlipidemia   . Hypertension    Past Surgical History:  Procedure Laterality Date  . APPENDECTOMY  1986  . CHOLECYSTECTOMY  1985  . colostomy bag    . Several cancer surgeries  1971, [place]  . TOTAL ABDOMINAL HYSTERECTOMY  1986   Family History  Problem Relation Age of Onset  . Diabetes Mother   . Hypertension Mother   . Heart attack Mother   . Heart disease Mother   . Stroke Mother   . Diabetes Father   . Hypertension Father   . Heart attack Father   . Heart disease Father   . Diabetes Maternal Grandmother   . Hypertension Maternal Grandmother   . Heart attack Maternal Grandmother   . Diabetes Maternal Grandfather   . Hypertension Maternal Grandfather   . Heart attack Maternal Grandfather   . Diabetes Paternal Grandmother   . Hypertension Paternal Grandmother   . Heart attack Paternal Grandmother   . Diabetes Paternal Grandfather   . Hypertension Paternal Grandfather   . Heart attack Paternal Grandfather   . Stroke Brother   . Asthma Brother   . Liver cancer Brother   . Cancer Sister    Social History   Socioeconomic History  . Marital status: Widowed    Spouse name: Not on file  . Number of children: 1  .  Years of education: 8  . Highest education level: 8th grade  Occupational History  . 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 Financial resource strain: Not hard at all  . Food insecurity:    Worry: Never true    Inability: Never true  . Transportation needs:    Medical: No    Non-medical: No  Tobacco Use  . Smoking status: Former Smoker    Types: Cigarettes    Last attempt to quit: 1978    Years since quitting: 41.8  . Smokeless tobacco: Never Used  Substance and Sexual Activity  . Alcohol use: Yes    Comment: occasionally   . Drug use: No  . Sexual activity: Not Currently  Lifestyle  . Physical activity:     Days per week: Not on file    Minutes per session: Not on file  . Stress: Only a little  Relationships  . Social connections:    Talks on phone: More than three times a week    Gets together: More than three times a week    Attends religious service: Never    Active member of club or organization: No    Attends meetings of clubs or organizations: Never    Relationship status: Widowed  Other Topics Concern  . Not on file  Social History Narrative  . Not on file    Tobacco Use No.  Clinical Intake:  Pre-visit preparation completed: No  Pain : No/denies pain     Nutritional Status: BMI 25 -29 Overweight Diabetes: No  How often do you need to have someone help you when you read instructions, pamphlets, or other written materials from your doctor or pharmacy?: 1 - Never What is the last grade level you completed in school?: 8th grade     Information entered by :: Chong Sicilian, RN   Activities of Daily Living In your present state of health, do you have any difficulty performing the following activities: 01/14/2018  Hearing? N  Vision? N  Difficulty concentrating or making decisions? N  Walking or climbing stairs? N  Dressing or bathing? N  Doing errands, shopping? N  Preparing Food and eating ? N  Using the Toilet? N  In the past six months, have you accidently leaked urine? Y  Do you have problems with loss of bowel control? N  Managing your Medications? N  Managing your Finances? N  Housekeeping or managing your Housekeeping? N  Some recent data might be hidden     Diet 2 to 3 meals a day. Mixture of home prepared and take out.  Exercise Current Exercise Habits: The patient does not participate in regular exercise at present(Stays busy around her house and yard), Exercise limited by: None identified   Depression Screen PHQ 2/9 Scores 01/14/2018 12/18/2017 06/11/2017 02/21/2017 01/08/2017 12/07/2016 09/18/2016  PHQ - 2 Score 0 0 0 0 0 0 0     Fall  Risk Fall Risk  01/14/2018 12/18/2017 06/11/2017 02/21/2017 01/08/2017  Falls in the past year? Yes No No No No  Comment - - - - -  Number falls in past yr: 1 - - - -  Injury with Fall? No - - - -  Comment - - - - -  Risk for fall due to : History of fall(s);Other (Comment) - - - -  Risk for fall due to: Comment stepped over the dog fence and stripped - - - -  Follow up  Falls prevention discussed - - - -    Safety Is the patient's home free of loose throw rugs in walkways, pet beds, electrical cords, etc?   yes        Handrails on the stairs?   yes      Adequate lighting?   yes  Patient Care Team: [name], FNP as PCP - General (Nurse Practitioner)  Hospitalizations, surgeries, and ER visits in previous 12 months No hospitalizations, ER visits, or surgeries this past year.   Objective:    Today's Vitals   01/14/18 1443  BP: 120/76  Pulse: 68  Weight: 145 lb (65.8 kg)  Height: 5\' 3"  (1.6 m)   Body mass index is 25.69 kg/m.  Advanced Directives 01/14/2018 01/08/2017 03/13/2014  Does Patient Have a Medical Advance Directive? Yes No Yes  Type of Advance Directive Living will;Healthcare Power of [place];Living will -  Does patient want to make changes to medical advance directive? Yes (MAU/Ambulatory/Procedural Areas - Information given) - -  Copy of [place] in Chart? No - copy requested No - copy requested No - copy requested    Hearing/Vision  No hearing or vision deficits noted during visit.  Cognitive Function: MMSE - Mini Mental State Exam 01/14/2018 01/08/2017  Orientation to time 5 5  Orientation to Place 5 5  Registration 3 3  Attention/ Calculation 5 5  Recall 2 3  Language- name 2 objects 2 2  Language- repeat 1 1  Language- follow 3 step command 3 3  Language- read & follow direction 1 1  Write a sentence 1 1  Copy design 1 1  Total score 29 30       Normal Cognitive Function Screening:  Yes    Immunizations and Health Maintenance Immunization History  Administered Date(s) Administered  . Influenza, High Dose Seasonal PF 01/31/2017, 01/14/2018  . Influenza,inj,Quad PF,6+ Mos 01/20/2013, 03/20/2016  . Pneumococcal Conjugate-13 07/29/2014  . Pneumococcal Polysaccharide-23 04/18/2010, 03/20/2016   Health Maintenance Due  Topic Date Due  . DEXA SCAN  06/07/2010  . MAMMOGRAM  10/30/2017   Health Maintenance  Topic Date Due  . DEXA SCAN  06/07/2010  . MAMMOGRAM  10/30/2017  . TETANUS/TDAP  05/04/2020  . INFLUENZA VACCINE  Completed  . Hepatitis C Screening  Completed  . PNA vac Low Risk Adult  Completed        Assessment:   This is a routine wellness examination for [place].    Plan:    Goals    . Exercise 3x per week (30 min per time)     Encourage increase in walking from 2 to 3 sessions per week for 30 minutes each time.         Health Maintenance Recommendations: Screening mammography-scheduled for 01/29/18 Influenza-given today Dexa scan-Will have at next visit   Additional Screening Recommendations: Lung: Low Dose CT Chest recommended if Age 41-80 years, 30 pack-year currently smoking OR have quit w/in 15years. Patient does not qualify. Hepatitis C Screening recommended: no  Today's Orders Orders Placed This Encounter  Procedures  . Flu vaccine HIGH DOSE PF    Keep f/u with [name], FNP and any other specialty appointments you may have Continue current medications Move carefully to avoid falls. Use assistive devices like a cane or walker if needed. Aim for at least 150 minutes of moderate activity a week. This can be done with chair exercises if necessary. Read or work on puzzles daily Stay  connected with friends and family  I have personally reviewed and noted the following in the patient's chart:   . Medical and social history . Use of alcohol, tobacco or illicit drugs  . Current medications and  supplements . Functional ability and status . Nutritional status . Physical activity . Advanced directives . List of other physicians . Hospitalizations, surgeries, and ER visits in previous 12 months . Vitals . Screenings to include cognitive, depression, and falls . Referrals and appointments  In addition, I have reviewed and discussed with patient certain preventive protocols, quality metrics, and best practice recommendations. A written personalized care plan for preventive services as well as general preventive health recommendations were provided to patient.     Chong Sicilian, RN   01/14/2018

## 2018-01-14 NOTE — Patient Instructions (Addendum)
[name] , Thank you for taking time to come for your Medicare Wellness Visit. I appreciate your ongoing commitment to your health goals. Please review the following plan we discussed and let me know if I can assist you in the future.   These are the goals we discussed: Goals    . Exercise 3x per week (30 min per time)     Encourage increase in walking from 2 to 3 sessions per week for 30 minutes each time.        This is a list of the screening recommended for you and due dates:  Health Maintenance  Topic Date Due  . DEXA scan (bone density measurement)  06/07/2010  . Mammogram  10/30/2017  . Flu Shot  11/01/2017  . Tetanus Vaccine  05/04/2020  .  Hepatitis C: One time screening is recommended by Center for Disease Control  (CDC) for  adults born from [birth year] through 1965.   Completed  . Pneumonia vaccines  Completed     DASH Eating Plan DASH stands for "Dietary Approaches to Stop Hypertension." The DASH eating plan is a healthy eating plan that has been shown to reduce high blood pressure (hypertension). It may also reduce your risk for type 2 diabetes, heart disease, and stroke. The DASH eating plan may also help with weight loss. What are tips for following this plan? General guidelines  Avoid eating more than 2,300 mg (milligrams) of salt (sodium) a day. If you have hypertension, you may need to reduce your sodium intake to 1,500 mg a day.  Limit alcohol intake to no more than 1 drink a day for nonpregnant women and 2 drinks a day for men. One drink equals 12 oz of beer, 5 oz of wine, or 1 oz of hard liquor.  Work with your health care provider to maintain a healthy body weight or to lose weight. Ask what an ideal weight is for you.  Get at least 30 minutes of exercise that causes your heart to beat faster (aerobic exercise) most days of the week. Activities may include walking, swimming, or biking.  Work with your health care provider or diet and nutrition specialist  (dietitian) to adjust your eating plan to your individual calorie needs. Reading food labels  Check food labels for the amount of sodium per serving. Choose foods with less than 5 percent of the Daily Value of sodium. Generally, foods with less than 300 mg of sodium per serving fit into this eating plan.  To find whole grains, look for the word "whole" as the first word in the ingredient list. Shopping  Buy products labeled as "low-sodium" or "no salt added."  Buy fresh foods. Avoid canned foods and premade or frozen meals. Cooking  Avoid adding salt when cooking. Use salt-free seasonings or herbs instead of table salt or sea salt. Check with your health care provider or pharmacist before using salt substitutes.  Do not fry foods. Cook foods using healthy methods such as baking, boiling, grilling, and broiling instead.  Cook with heart-healthy oils, such as olive, canola, soybean, or sunflower oil. Meal planning   Eat a balanced diet that includes: ? 5 or more servings of fruits and vegetables each day. At each meal, try to fill half of your plate with fruits and vegetables. ? Up to 6-8 servings of whole grains each day. ? Less than 6 oz of lean meat, poultry, or fish each day. A 3-oz serving of meat is about the same size as  a deck of cards. One egg equals 1 oz. ? 2 servings of low-fat dairy each day. ? A serving of nuts, seeds, or beans 5 times each week. ? Heart-healthy fats. Healthy fats called Omega-3 fatty acids are found in foods such as flaxseeds and coldwater fish, like sardines, salmon, and mackerel.  Limit how much you eat of the following: ? Canned or prepackaged foods. ? Food that is high in trans fat, such as fried foods. ? Food that is high in saturated fat, such as fatty meat. ? Sweets, desserts, sugary drinks, and other foods with added sugar. ? Full-fat dairy products.  Do not salt foods before eating.  Try to eat at least 2 vegetarian meals each week.  Eat  more home-cooked food and less restaurant, buffet, and fast food.  When eating at a restaurant, ask that your food be prepared with less salt or no salt, if possible. What foods are recommended? The items listed may not be a complete list. Talk with your dietitian about what dietary choices are best for you. Grains Whole-grain or whole-wheat bread. Whole-grain or whole-wheat pasta. Brown rice. [place]. Bulgur. Whole-grain and low-sodium cereals. Pita bread. Low-fat, low-sodium crackers. Whole-wheat flour tortillas. Vegetables Fresh or frozen vegetables (raw, steamed, roasted, or grilled). Low-sodium or reduced-sodium tomato and vegetable juice. Low-sodium or reduced-sodium tomato sauce and tomato paste. Low-sodium or reduced-sodium canned vegetables. Fruits All fresh, dried, or frozen fruit. Canned fruit in natural juice (without added sugar). Meat and other protein foods Skinless chicken or [place]. Ground chicken or [place]. Pork with fat trimmed off. Fish and seafood. Egg whites. Dried beans, peas, or lentils. Unsalted nuts, nut butters, and seeds. Unsalted canned beans. Lean cuts of beef with fat trimmed off. Low-sodium, lean deli meat. Dairy Low-fat (1%) or fat-free (skim) milk. Fat-free, low-fat, or reduced-fat cheeses. Nonfat, low-sodium ricotta or cottage cheese. Low-fat or nonfat yogurt. Low-fat, low-sodium cheese. Fats and oils Soft margarine without trans fats. Vegetable oil. Low-fat, reduced-fat, or light mayonnaise and salad dressings (reduced-sodium). Canola, safflower, olive, soybean, and sunflower oils. Avocado. Seasoning and other foods Herbs. Spices. Seasoning mixes without salt. Unsalted popcorn and pretzels. Fat-free sweets. What foods are not recommended? The items listed may not be a complete list. Talk with your dietitian about what dietary choices are best for you. Grains Baked goods made with fat, such as croissants, muffins, or some breads. Dry pasta or rice meal  packs. Vegetables Creamed or fried vegetables. Vegetables in a cheese sauce. Regular canned vegetables (not low-sodium or reduced-sodium). Regular canned tomato sauce and paste (not low-sodium or reduced-sodium). Regular tomato and vegetable juice (not low-sodium or reduced-sodium). Angie Fava. Olives. Fruits Canned fruit in a light or heavy syrup. Fried fruit. Fruit in cream or butter sauce. Meat and other protein foods Fatty cuts of meat. Ribs. Fried meat. Berniece Salines. Sausage. Bologna and other processed lunch meats. Salami. Fatback. Hotdogs. Bratwurst. Salted nuts and seeds. Canned beans with added salt. Canned or smoked fish. Whole eggs or egg yolks. Chicken or [place] with skin. Dairy Whole or 2% milk, cream, and half-and-half. Whole or full-fat cream cheese. Whole-fat or sweetened yogurt. Full-fat cheese. Nondairy creamers. Whipped toppings. Processed cheese and cheese spreads. Fats and oils Butter. Stick margarine. Lard. Shortening. Ghee. Bacon fat. Tropical oils, such as coconut, palm kernel, or palm oil. Seasoning and other foods Salted popcorn and pretzels. Onion salt, garlic salt, seasoned salt, table salt, and sea salt. Worcestershire sauce. Tartar sauce. Barbecue sauce. Teriyaki sauce. Soy sauce, including reduced-sodium. Steak sauce.  Canned and packaged gravies. Fish sauce. Oyster sauce. Cocktail sauce. Horseradish that you find on the shelf. Ketchup. Mustard. Meat flavorings and tenderizers. Bouillon cubes. Hot sauce and Tabasco sauce. Premade or packaged marinades. Premade or packaged taco seasonings. Relishes. Regular salad dressings. Where to find more information:  National Heart, Lung, and [place]: https://wilson-eaton.com/  American Heart Association: www.heart.org Summary  The DASH eating plan is a healthy eating plan that has been shown to reduce high blood pressure (hypertension). It may also reduce your risk for type 2 diabetes, heart disease, and stroke.  With the DASH eating  plan, you should limit salt (sodium) intake to 2,300 mg a day. If you have hypertension, you may need to reduce your sodium intake to 1,500 mg a day.  When on the DASH eating plan, aim to eat more fresh fruits and vegetables, whole grains, lean proteins, low-fat dairy, and heart-healthy fats.  Work with your health care provider or diet and nutrition specialist (dietitian) to adjust your eating plan to your individual calorie needs. This information is not intended to replace advice given to you by your health care provider. Make sure you discuss any questions you have with your health care provider. Document Released: 03/09/2011 Document Revised: 03/13/2016 Document Reviewed: 03/13/2016 Elsevier Interactive Patient Education  [employer].

## 2018-01-15 DIAGNOSIS — H401131 Primary open-angle glaucoma, bilateral, mild stage: Secondary | ICD-10-CM | POA: Diagnosis not present

## 2018-01-25 DIAGNOSIS — C52 Malignant neoplasm of vagina: Secondary | ICD-10-CM | POA: Diagnosis not present

## 2018-01-29 DIAGNOSIS — Z1231 Encounter for screening mammogram for malignant neoplasm of breast: Secondary | ICD-10-CM | POA: Diagnosis not present

## 2018-01-29 LAB — HM MAMMOGRAPHY

## 2018-02-04 DIAGNOSIS — H40013 Open angle with borderline findings, low risk, bilateral: Secondary | ICD-10-CM | POA: Diagnosis not present

## 2018-02-12 ENCOUNTER — Other Ambulatory Visit: Payer: Self-pay | Admitting: Nurse Practitioner

## 2018-02-18 DIAGNOSIS — N823 Fistula of vagina to large intestine: Secondary | ICD-10-CM | POA: Diagnosis not present

## 2018-03-19 ENCOUNTER — Encounter: Payer: Self-pay | Admitting: Nurse Practitioner

## 2018-03-19 ENCOUNTER — Ambulatory Visit (INDEPENDENT_AMBULATORY_CARE_PROVIDER_SITE_OTHER): Payer: Medicare Other | Admitting: Nurse Practitioner

## 2018-03-19 VITALS — BP 135/76 | HR 76 | Temp 96.8°F | Ht 63.0 in | Wt 147.0 lb

## 2018-03-19 DIAGNOSIS — J301 Allergic rhinitis due to pollen: Secondary | ICD-10-CM | POA: Diagnosis not present

## 2018-03-19 DIAGNOSIS — J4521 Mild intermittent asthma with (acute) exacerbation: Secondary | ICD-10-CM

## 2018-03-19 DIAGNOSIS — E785 Hyperlipidemia, unspecified: Secondary | ICD-10-CM

## 2018-03-19 DIAGNOSIS — C52 Malignant neoplasm of vagina: Secondary | ICD-10-CM

## 2018-03-19 DIAGNOSIS — I10 Essential (primary) hypertension: Secondary | ICD-10-CM

## 2018-03-19 MED ORDER — HYDROCHLOROTHIAZIDE 25 MG PO TABS: 25.0000 mg | ORAL_TABLET | Freq: Every day | ORAL | 3 refills | Status: DC

## 2018-03-19 MED ORDER — ENALAPRIL MALEATE 20 MG PO TABS: 20.0000 mg | ORAL_TABLET | Freq: Two times a day (BID) | ORAL | 1 refills | Status: DC

## 2018-03-19 NOTE — Patient Instructions (Signed)

## 2018-03-19 NOTE — Progress Notes (Signed)
Subjective:    Patient ID: Sheri Russell, female    DOB: 12/21/1945, 72 y.o.   MRN: [id number]   Chief Complaint: medical management of chronic issues  HPI:  1. Essential hypertension, benign  No c/o chest pain, sob or headache. Does not check blood pressure at home. BP Readings from Last 3 Encounters:  03/19/18 135/76  01/14/18 120/76  12/18/17 138/84     2. Mild intermittent asthma with acute exacerbation  Has not needed albuterol lately  3. Seasonal allergic rhinitis due to pollen  Takes clartin when she get runny  Nose.  4. Hyperlipidemia with target LDL less than 100  Not watch diet. Does very little exercise.  5. 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 Then had tumr on bladder and she had chemo radiation and resolved. 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     Outpatient Encounter Medications as of 03/19/2018  Medication Sig  . albuterol (PROVENTIL HFA;VENTOLIN HFA) 108 (90 Base) MCG/ACT inhaler INHALE 2 PUFFS BY MOUTH EVERY 6 HOURS AS NEEDED FOR WHEEZING OR SHORTNESS OF BREATH  . brimonidine (ALPHAGAN) 0.15 % ophthalmic solution Place 1 drop into the left eye 2 (two) times daily.  . enalapril (VASOTEC) 20 MG tablet Take 1 tablet (20 mg total) by mouth 2 (two) times daily.  . hydrochlorothiazide (HYDRODIURIL) 25 MG tablet Take 1 tablet (25 mg total) by mouth daily.  . meclizine (ANTIVERT) 25 MG tablet TAKE 1 TABLET BY MOUTH THREE TIMES DAILY AS NEEDED FOR  DIZZINESS  . ondansetron (ZOFRAN) 4 MG tablet TAKE 1 TABLET BY MOUTH EVERY 8 HOURS AS NEEDED FOR NAUSEA AND VOMITING       New complaints: None today  Social history: Lives alone   Review of Systems  Constitutional: Negative for activity change and appetite change.  HENT: Negative.   Eyes: Negative for pain.  Respiratory: Negative for shortness of breath.   Cardiovascular: Negative for chest pain, palpitations and leg swelling.  Gastrointestinal: Negative  for abdominal pain.  Endocrine: Negative for polydipsia.  Genitourinary: Negative.   Skin: Negative for rash.  Neurological: Negative for dizziness, weakness and headaches.  Hematological: Does not bruise/bleed easily.  Psychiatric/Behavioral: Negative.   All other systems reviewed and are negative.      Objective:   Physical Exam Vitals signs and nursing note reviewed.  Constitutional:      General: She is not in acute distress.    Appearance: Normal appearance. She is well-developed.  HENT:     Head: Normocephalic.     Nose: Nose normal.  Eyes:     Pupils: Pupils are equal, round, and reactive to light.  Neck:     Musculoskeletal: Normal range of motion and neck supple.     Vascular: No carotid bruit or JVD.  Cardiovascular:     Rate and Rhythm: Normal rate and regular rhythm.     Heart sounds: Normal heart sounds.  Pulmonary:     Effort: Pulmonary effort is normal. No respiratory distress.     Breath sounds: Normal breath sounds. No wheezing or rales.  Chest:     Chest wall: No tenderness.  Abdominal:     General: Bowel sounds are normal. There is no distension or abdominal bruit.     Palpations: Abdomen is soft. There is no hepatomegaly, splenomegaly, mass or pulsatile mass.     Tenderness: There is no abdominal tenderness.     Comments: Colostomy stump clear- no  erythema  Musculoskeletal: Normal range of motion.  Lymphadenopathy:     Cervical: No cervical adenopathy.  Skin:    General: Skin is warm and dry.  Neurological:     Mental Status: She is alert and oriented to person, place, and time.     Deep Tendon Reflexes: Reflexes are normal and symmetric.  Psychiatric:        Behavior: Behavior normal.        Thought Content: Thought content normal.        Judgment: Judgment normal.    BP 135/76   Pulse 76   Temp (!) 96.8 F (36 C) (Oral)   Ht _0  (1.6 m)   Wt 147 lb (66.7 kg)   LMP 07/09/1975   BMI 26.04 kg/m       Assessment & Plan:  Sheri Russell comes in today with chief complaint of No chief complaint on file.   Diagnosis and orders addressed:  1. Essential hypertension, benign Low sodium diet - CMP14+EGFR - hydrochlorothiazide (HYDRODIURIL) 25 MG tablet; Take 1 tablet (25 mg total) by mouth daily.  Dispense: 90 tablet; Refill: 3 - enalapril (VASOTEC) 20 MG tablet; Take 1 tablet (20 mg total) by mouth 2 (two) times daily.  Dispense: 180 tablet; Refill: 1  2. Mild intermittent asthma with acute exacerbation Use albuterol as needed  3. Seasonal allergic rhinitis due to pollen  4. Hyperlipidemia with target LDL less than 100 Low fat diet - Lipid panel  5. Cancer of vagina [facility])   Labs pending Health Maintenance reviewed Diet and exercise encouraged  Follow up plan: 6 months   [place], FNP

## 2018-03-20 LAB — CMP14+EGFR
ALT: 22 IU/L (ref 0–32)
AST: 19 IU/L (ref 0–40)
Albumin/Globulin Ratio: 1.5 (ref 1.2–2.2)
Albumin: 4.1 g/dL (ref 3.5–4.8)
Alkaline Phosphatase: 75 IU/L (ref 39–117)
BILIRUBIN TOTAL: 0.2 mg/dL (ref 0.0–1.2)
BUN / CREAT RATIO: 14 (ref 12–28)
BUN: 13 mg/dL (ref 8–27)
CHLORIDE: 101 mmol/L (ref 96–106)
CO2: 26 mmol/L (ref 20–29)
CREATININE: 0.95 mg/dL (ref 0.57–1.00)
Calcium: 9.1 mg/dL (ref 8.7–10.3)
GFR calc Af Amer: 69 mL/min/{1.73_m2} (ref >59)
GFR calc non Af Amer: 60 mL/min/{1.73_m2} (ref >59)
GLOBULIN, TOTAL: 2.8 g/dL (ref 1.5–4.5)
Glucose: 82 mg/dL (ref 65–99)
Potassium: 3.5 mmol/L (ref 3.5–5.2)
SODIUM: 144 mmol/L (ref 134–144)
Total Protein: 6.9 g/dL (ref 6.0–8.5)

## 2018-03-20 LAB — LIPID PANEL
CHOLESTEROL TOTAL: 152 mg/dL (ref 100–199)
Chol/HDL Ratio: 2.6 ratio (ref 0.0–4.4)
HDL: 59 mg/dL (ref >39)
LDL CALC: 53 mg/dL (ref 0–99)
Triglycerides: 201 mg/dL — ABNORMAL HIGH (ref 0–149)
VLDL Cholesterol Cal: 40 mg/dL (ref 5–40)

## 2018-04-01 ENCOUNTER — Other Ambulatory Visit: Payer: Self-pay | Admitting: Nurse Practitioner

## 2018-04-30 DIAGNOSIS — H2513 Age-related nuclear cataract, bilateral: Secondary | ICD-10-CM | POA: Diagnosis not present

## 2018-04-30 DIAGNOSIS — H401430 Capsular glaucoma with pseudoexfoliation of lens, bilateral, stage unspecified: Secondary | ICD-10-CM | POA: Diagnosis not present

## 2018-05-03 ENCOUNTER — Encounter: Payer: Self-pay | Admitting: *Deleted

## 2018-05-14 DIAGNOSIS — H401430 Capsular glaucoma with pseudoexfoliation of lens, bilateral, stage unspecified: Secondary | ICD-10-CM | POA: Diagnosis not present

## 2018-06-04 DIAGNOSIS — H401430 Capsular glaucoma with pseudoexfoliation of lens, bilateral, stage unspecified: Secondary | ICD-10-CM | POA: Diagnosis not present

## 2018-06-18 ENCOUNTER — Other Ambulatory Visit: Payer: Self-pay | Admitting: Nurse Practitioner

## 2018-07-29 ENCOUNTER — Other Ambulatory Visit: Payer: Self-pay | Admitting: Nurse Practitioner

## 2018-09-12 ENCOUNTER — Other Ambulatory Visit: Payer: Self-pay

## 2018-09-13 ENCOUNTER — Encounter: Payer: Self-pay | Admitting: Nurse Practitioner

## 2018-09-13 ENCOUNTER — Ambulatory Visit (INDEPENDENT_AMBULATORY_CARE_PROVIDER_SITE_OTHER): Payer: Medicare Other | Admitting: Nurse Practitioner

## 2018-09-13 VITALS — BP 136/85 | HR 79 | Temp 97.2°F | Ht 63.0 in | Wt 144.0 lb

## 2018-09-13 DIAGNOSIS — E785 Hyperlipidemia, unspecified: Secondary | ICD-10-CM | POA: Diagnosis not present

## 2018-09-13 DIAGNOSIS — J301 Allergic rhinitis due to pollen: Secondary | ICD-10-CM | POA: Diagnosis not present

## 2018-09-13 DIAGNOSIS — J4521 Mild intermittent asthma with (acute) exacerbation: Secondary | ICD-10-CM

## 2018-09-13 DIAGNOSIS — I10 Essential (primary) hypertension: Secondary | ICD-10-CM | POA: Diagnosis not present

## 2018-09-13 MED ORDER — ENALAPRIL MALEATE 20 MG PO TABS: 20.0000 mg | ORAL_TABLET | Freq: Two times a day (BID) | ORAL | 1 refills | Status: DC

## 2018-09-13 MED ORDER — HYDROCHLOROTHIAZIDE 25 MG PO TABS: 25.0000 mg | ORAL_TABLET | Freq: Every day | ORAL | 3 refills | Status: DC

## 2018-09-13 NOTE — Progress Notes (Signed)
Subjective:    Patient ID: Sheri Russell, female    DOB: [birth year], 73 y.o.   MRN: [id number]   Chief Complaint: Medical Management of Chronic Issues    HPI:  1. Essential hypertension, benign No c/o chest pain, sob or headache. Does not check blood pressure at home. BP Readings from Last 3 Encounters:  09/13/18 136/85  03/19/18 135/76  01/14/18 120/76     2. Hyperlipidemia with target LDL less than 100 Does not really watch diet. She walks her dogs 4x a day  3. Seasonal allergic rhinitis due to pollen So far so good. Currently  Not taking anything  4. Mild intermittent asthma with acute exacerbation No recent flare ups.has not needed ventolin inhaler    Outpatient Encounter Medications as of 09/13/2018  Medication Sig  . brimonidine (ALPHAGAN) 0.15 % ophthalmic solution Place 1 drop into the left eye 2 (two) times daily.  . enalapril (VASOTEC) 20 MG tablet Take 1 tablet (20 mg total) by mouth 2 (two) times daily.  . hydrochlorothiazide (HYDRODIURIL) 25 MG tablet Take 1 tablet (25 mg total) by mouth daily.  . meclizine (ANTIVERT) 25 MG tablet TAKE 1 TABLET BY MOUTH THREE TIMES DAILY AS NEEDED FOR DIZZINESS  . ondansetron (ZOFRAN) 4 MG tablet TAKE 1 TABLET BY MOUTH EVERY 8 HOURS AS NEEDED FOR NAUSEA AND VOMITING  . VENTOLIN HFA 108 (90 Base) MCG/ACT inhaler INHALE 2 PUFFS BY MOUTH EVERY 6 HOURS AS NEEDED FOR WHEEZING OR SHORTNESS OF BREATH     Past Surgical History:  Procedure Laterality Date  . APPENDECTOMY  1986  . CHOLECYSTECTOMY  1985  . colostomy bag    . Several cancer surgeries  1971, [place]  . TOTAL ABDOMINAL HYSTERECTOMY  1986    Family History  Problem Relation Age of Onset  . Diabetes Mother   . Hypertension Mother   . Heart attack Mother   . Heart disease Mother   . Stroke Mother   . Diabetes Father   . Hypertension Father   . Heart attack Father   . Heart disease Father   . Diabetes Maternal Grandmother   . Hypertension Maternal  Grandmother   . Heart attack Maternal Grandmother   . Diabetes Maternal Grandfather   . Hypertension Maternal Grandfather   . Heart attack Maternal Grandfather   . Diabetes Paternal Grandmother   . Hypertension Paternal Grandmother   . Heart attack Paternal Grandmother   . Diabetes Paternal Grandfather   . Hypertension Paternal Grandfather   . Heart attack Paternal Grandfather   . Stroke Brother   . Asthma Brother   . Liver cancer Brother   . Cancer Sister     New complaints: None today  Social history: Lives by herself. Her daughter checks on her daily. She is also taking care of her sister.      Review of Systems  Constitutional: Negative for activity change and appetite change.  HENT: Negative.   Eyes: Negative for pain.  Respiratory: Negative for shortness of breath.   Cardiovascular: Negative for chest pain, palpitations and leg swelling.  Gastrointestinal: Negative for abdominal pain.  Endocrine: Negative for polydipsia.  Genitourinary: Negative.   Skin: Negative for rash.  Neurological: Negative for dizziness, weakness and headaches.  Hematological: Does not bruise/bleed easily.  Psychiatric/Behavioral: Negative.   All other systems reviewed and are negative.      Objective:   Physical Exam Vitals signs and nursing note reviewed.  Constitutional:      General: She  is not in acute distress.    Appearance: Normal appearance. She is well-developed.  HENT:     Head: Normocephalic.     Nose: Nose normal.  Eyes:     Pupils: Pupils are equal, round, and reactive to light.  Neck:     Musculoskeletal: Normal range of motion and neck supple.     Vascular: No carotid bruit or JVD.  Cardiovascular:     Rate and Rhythm: Normal rate and regular rhythm.     Heart sounds: Normal heart sounds.  Pulmonary:     Effort: Pulmonary effort is normal. No respiratory distress.     Breath sounds: Normal breath sounds. No wheezing or rales.  Chest:     Chest wall: No  tenderness.  Abdominal:     General: Bowel sounds are normal. There is no distension or abdominal bruit.     Palpations: Abdomen is soft. There is no hepatomegaly, splenomegaly, mass or pulsatile mass.     Tenderness: There is no abdominal tenderness.     Comments: Colostomy stump good- no erythema or edema  Musculoskeletal: Normal range of motion.  Lymphadenopathy:     Cervical: No cervical adenopathy.  Skin:    General: Skin is warm and dry.  Neurological:     Mental Status: She is alert and oriented to person, place, and time.     Deep Tendon Reflexes: Reflexes are normal and symmetric.  Psychiatric:        Behavior: Behavior normal.        Thought Content: Thought content normal.        Judgment: Judgment normal.    BP 136/85   Pulse 79   Temp (!) 97.2 F (36.2 C) (Oral)   Ht 5' 3"  (1.6 m)   Wt 144 lb (65.3 kg)   LMP 07/09/1975   BMI 25.51 kg/m         Assessment & Plan:  Sheri Russell comes in today with chief complaint of Medical Management of Chronic Issues   Diagnosis and orders addressed:  1. Essential hypertension, benign Low sodium diet - hydrochlorothiazide (HYDRODIURIL) 25 MG tablet; Take 1 tablet (25 mg total) by mouth daily.  Dispense: 90 tablet; Refill: 3 - enalapril (VASOTEC) 20 MG tablet; Take 1 tablet (20 mg total) by mouth 2 (two) times daily.  Dispense: 180 tablet; Refill: 1 - CMP14+EGFR  2. Hyperlipidemia with target LDL less than 100 Low fat diet - Lipid panel  3. Seasonal allergic rhinitis due to pollen Add claritin as needed  4. Mild intermittent asthma with acute exacerbation Wear mask when outside   Labs pending Health Maintenance reviewed Diet and exercise encouraged  Follow up plan: 6 months   Independent [place], FNP

## 2018-09-13 NOTE — Patient Instructions (Signed)

## 2018-09-14 LAB — LIPID PANEL
Chol/HDL Ratio: 2.8 ratio (ref 0.0–4.4)
Cholesterol, Total: 169 mg/dL (ref 100–199)
HDL: 60 mg/dL (ref >39)
LDL Calculated: 79 mg/dL (ref 0–99)
Triglycerides: 150 mg/dL — ABNORMAL HIGH (ref 0–149)
VLDL Cholesterol Cal: 30 mg/dL (ref 5–40)

## 2018-09-14 LAB — CMP14+EGFR
ALT: 23 IU/L (ref 0–32)
AST: 25 IU/L (ref 0–40)
Albumin/Globulin Ratio: 1.4 (ref 1.2–2.2)
Albumin: 4.4 g/dL (ref 3.7–4.7)
Alkaline Phosphatase: 72 IU/L (ref 39–117)
BUN/Creatinine Ratio: 15 (ref 12–28)
BUN: 12 mg/dL (ref 8–27)
Bilirubin Total: 0.3 mg/dL (ref 0.0–1.2)
CO2: 25 mmol/L (ref 20–29)
Calcium: 9.5 mg/dL (ref 8.7–10.3)
Chloride: 103 mmol/L (ref 96–106)
Creatinine, Ser: 0.81 mg/dL (ref 0.57–1.00)
GFR calc Af Amer: 83 mL/min/{1.73_m2} (ref >59)
GFR calc non Af Amer: 72 mL/min/{1.73_m2} (ref >59)
Globulin, Total: 3.1 g/dL (ref 1.5–4.5)
Glucose: 86 mg/dL (ref 65–99)
Potassium: 4 mmol/L (ref 3.5–5.2)
Sodium: 141 mmol/L (ref 134–144)
Total Protein: 7.5 g/dL (ref 6.0–8.5)

## 2018-09-18 ENCOUNTER — Ambulatory Visit: Payer: Medicare Other | Admitting: Nurse Practitioner

## 2018-12-10 ENCOUNTER — Other Ambulatory Visit: Payer: Self-pay | Admitting: Nurse Practitioner

## 2018-12-13 DIAGNOSIS — Z23 Encounter for immunization: Secondary | ICD-10-CM | POA: Diagnosis not present

## 2018-12-19 ENCOUNTER — Ambulatory Visit: Payer: Self-pay | Admitting: Nurse Practitioner

## 2018-12-23 DIAGNOSIS — H401430 Capsular glaucoma with pseudoexfoliation of lens, bilateral, stage unspecified: Secondary | ICD-10-CM | POA: Diagnosis not present

## 2018-12-31 ENCOUNTER — Other Ambulatory Visit: Payer: Self-pay

## 2019-01-01 ENCOUNTER — Encounter: Payer: Self-pay | Admitting: Nurse Practitioner

## 2019-01-01 ENCOUNTER — Ambulatory Visit (INDEPENDENT_AMBULATORY_CARE_PROVIDER_SITE_OTHER): Payer: Medicare Other | Admitting: Nurse Practitioner

## 2019-01-01 VITALS — BP 125/71 | HR 67 | Temp 97.7°F | Resp 20 | Ht 63.0 in | Wt 139.0 lb

## 2019-01-01 DIAGNOSIS — E785 Hyperlipidemia, unspecified: Secondary | ICD-10-CM | POA: Diagnosis not present

## 2019-01-01 DIAGNOSIS — I10 Essential (primary) hypertension: Secondary | ICD-10-CM

## 2019-01-01 DIAGNOSIS — Z6824 Body mass index (BMI) 24.0-24.9, adult: Secondary | ICD-10-CM

## 2019-01-01 MED ORDER — HYDROCHLOROTHIAZIDE 25 MG PO TABS: 25.0000 mg | ORAL_TABLET | Freq: Every day | ORAL | 1 refills | Status: DC

## 2019-01-01 MED ORDER — ENALAPRIL MALEATE 20 MG PO TABS: 20.0000 mg | ORAL_TABLET | Freq: Two times a day (BID) | ORAL | 1 refills | Status: DC

## 2019-01-01 NOTE — Patient Instructions (Signed)
Colostomy Home Guide, Adult  Colostomy surgery is done to create an opening in the front of the abdomen for stool (feces) to leave the body through an ostomy (stoma). Part of the large intestine is attached to the stoma. A bag, also called a pouch, is fitted over the stoma. Stool and gas will collect in the bag. After surgery, you will need to empty and change your colostomy bag as needed. You will also need to care for your stoma. How to care for the stoma Your stoma should look pink, red, and moist, like the inside of your cheek. Soon after surgery, the stoma may be swollen, but this swelling will go away within 6 weeks. To care for the stoma:  Keep the skin around the stoma clean and dry.  Use a clean, soft washcloth to gently wash the stoma and the skin around it. Clean using a circular motion, and wipe away from the stoma opening, not toward it. ? Use warm water and only use cleansers recommended by your health care provider. ? Rinse the stoma area with plain water. ? Dry the area around the stoma well.  Use stoma powder or ointment on your skin only as told by your health care provider. Do not use any other powders, gels, wipes, or creams on the skin around the stoma.  Check the stoma area every day for signs of infection. Check for: ? New or worsening redness, swelling, or pain. ? New or increased fluid or blood. ? Pus or warmth.  Measure the stoma opening regularly and record the size. Watch for changes. (It is normal for the stoma to get smaller as swelling goes away.) Share this information with your health care provider. How to empty the colostomy bag  Empty your bag at bedtime and whenever it is one-third to one-half full. Do not let the bag get more than half-full with stool or gas. The bag could leak if it gets too full. Some colostomy bags have a built-in gas release valve that releases gas often throughout the day. Follow these basic steps: 1. Wash your hands with soap and  water. 2. Sit far back on the toilet seat. 3. Put several pieces of toilet paper into the toilet water. This will prevent splashing as you empty stool into the toilet. 4. Remove the clip or the hook-and-loop fastener from the tail end of the bag. 5. Unroll the tail, then empty the stool into the toilet. 6. Clean the tail with toilet paper or a moist towelette. 7. Reroll the tail, and close it with the clip or the hook-and-loop fastener. 8. Wash your hands again. How to change the colostomy bag Change your bag every 3-4 days or as often as told by your health care provider. Also change the bag if it is leaking or separating from the skin, or if your skin around the stoma looks or feels irritated. Irritated skin may be a sign that the bag is leaking. Always have colostomy supplies with you, and follow these basic steps: 1. Wash your hands with soap and water. Have paper towels or tissues nearby to clean any discharge. 2. Remove the old bag and skin barrier. Use your fingers or a warm cloth to gently push the skin away from the barrier. 3. Clean the stoma area with water or with mild soap and water, as directed. Use water to rinse away any soap. 4. Dry the skin. You may use the cool setting on a hair dryer to do this.  5. Use a tracing pattern (template) to cut the skin barrier to the size needed. 6. If you are using a two-piece bag, attach the bag and the skin barrier to each other. Add the barrier ring, if you use one. 7. If directed, apply stoma powder or skin barrier gel to the skin. 8. Warm the skin barrier with your hands, or blow with a hair dryer for 5-10 seconds. 9. Remove the paper from the adhesive strip of the skin barrier. 10. Press the adhesive strip onto the skin around the stoma. 11. Gently rub the skin barrier onto the skin. This creates heat that helps the barrier to stick. 12. Apply stoma tape to the edges of the skin barrier, if desired. 57. Wash your hands again. General  recommendations  Avoid wearing tight clothes or having anything press directly on your stoma or bag. Change your clothing whenever it is soiled or damp.  You may shower or bathe with the bag on or off. Do not use harsh or oily soaps or lotions. Dry the skin and bag after bathing.  Store all supplies in a cool, dry place. Do not leave supplies in extreme heat because some parts can melt or not stick as well.  Whenever you leave home, take extra clothing and an extra skin barrier and bag with you.  If your bag gets wet, you can dry it with a hair dryer on the cool setting.  To prevent odor, you may put drops of ostomy deodorizer in the bag.  If recommended by your health care provider, put ostomy lubricant inside the bag. This helps stool to slide out of the bag more easily and completely. Contact a health care provider if:  You have new or worsening redness, swelling, or pain around your stoma.  You have new or increased fluid or blood coming from your stoma.  Your stoma feels warm to the touch.  You have pus coming from your stoma.  Your stoma extends in or out farther than normal.  You need to change your bag every day.  You have a fever. Get help right away if:  Your stool is bloody.  You have nausea or you vomit.  You have trouble breathing. Summary  Measure your stoma opening regularly and record the size. Watch for changes.  Empty your bag at bedtime and whenever it is one-third to one-half full. Do not let the bag get more than half-full with stool or gas.  Change your bag every 3-4 days or as often as told by your health care provider.  Whenever you leave home, take extra clothing and an extra skin barrier and bag with you. This information is not intended to replace advice given to you by your health care provider. Make sure you discuss any questions you have with your health care provider. Document Released: 03/23/2003 Document Revised: 07/10/2018 Document  Reviewed: 09/13/2016 Elsevier Patient Education  2020 [employer].

## 2019-01-01 NOTE — Progress Notes (Signed)
Subjective:    Patient ID: Sheri Russell, female    DOB: 1945/10/04, 73 y.o.   MRN: [id number]   Chief Complaint: Medical Management of Chronic Issues    HPI:  1. Essential hypertension, benign No c/o chest pain, sob or headache. Does not check blood pressure at home. BP Readings from Last 3 Encounters:  01/01/19 125/71  09/13/18 136/85  03/19/18 135/76     2. Hyperlipidemia with target LDL less than 100 Does not watch diet and does very little exercise. Lab Results  Component Value Date   CHOL 169 09/13/2018   HDL 60 09/13/2018   LDLCALC 79 09/13/2018   TRIG 150 (H) 09/13/2018   CHOLHDL 2.8 09/13/2018     3. BMI 25.0-25.9,adult Weight down 5 lbs from last visit Wt Readings from Last 3 Encounters:  01/01/19 139 lb (63 kg)  09/13/18 144 lb (65.3 kg)  03/19/18 147 lb (66.7 kg)   BMI Readings from Last 3 Encounters:  01/01/19 24.62 kg/m  09/13/18 25.51 kg/m  03/19/18 26.04 kg/m       Outpatient Encounter Medications as of 01/01/2019  Medication Sig  . enalapril (VASOTEC) 20 MG tablet Take 1 tablet (20 mg total) by mouth 2 (two) times daily.  . meclizine (ANTIVERT) 25 MG tablet TAKE 1 TABLET BY MOUTH THREE TIMES DAILY AS NEEDED FOR DIZZINESS  . ondansetron (ZOFRAN) 4 MG tablet TAKE 1 TABLET BY MOUTH EVERY 8 HOURS AS NEEDED FOR NAUSEA AND VOMITING  . VENTOLIN HFA 108 (90 Base) MCG/ACT inhaler INHALE 2 PUFFS BY MOUTH EVERY 6 HOURS AS NEEDED FOR WHEEZING OR SHORTNESS OF BREATH  . hydrochlorothiazide (HYDRODIURIL) 25 MG tablet Take 1 tablet (25 mg total) by mouth daily. (Patient not taking: Reported on 01/01/2019)     Past Surgical History:  Procedure Laterality Date  . APPENDECTOMY  1986  . CHOLECYSTECTOMY  1985  . colostomy bag    . Several cancer surgeries  1971, [place]  . TOTAL ABDOMINAL HYSTERECTOMY  1986    Family History  Problem Relation Age of Onset  . Diabetes Mother   . Hypertension Mother   . Heart attack Mother   . Heart disease  Mother   . Stroke Mother   . Diabetes Father   . Hypertension Father   . Heart attack Father   . Heart disease Father   . Diabetes Maternal Grandmother   . Hypertension Maternal Grandmother   . Heart attack Maternal Grandmother   . Diabetes Maternal Grandfather   . Hypertension Maternal Grandfather   . Heart attack Maternal Grandfather   . Diabetes Paternal Grandmother   . Hypertension Paternal Grandmother   . Heart attack Paternal Grandmother   . Diabetes Paternal Grandfather   . Hypertension Paternal Grandfather   . Heart attack Paternal Grandfather   . Stroke Brother   . Asthma Brother   . Liver cancer Brother   . Cancer Sister     New complaints: None today  Social history: Lives by herself  Controlled substance contract: n/a    Review of Systems  Constitutional: Negative for activity change and appetite change.  HENT: Negative.   Eyes: Negative for pain.  Respiratory: Negative for shortness of breath.   Cardiovascular: Negative for chest pain, palpitations and leg swelling.  Gastrointestinal: Negative for abdominal pain.  Endocrine: Negative for polydipsia.  Genitourinary: Negative.   Skin: Negative for rash.  Neurological: Negative for dizziness, weakness and headaches.  Hematological: Does not bruise/bleed easily.  Psychiatric/Behavioral: Negative.  All other systems reviewed and are negative.      Objective:   Physical Exam Vitals signs and nursing note reviewed.  Constitutional:      General: She is not in acute distress.    Appearance: Normal appearance. She is well-developed.  HENT:     Head: Normocephalic.     Nose: Nose normal.  Eyes:     Pupils: Pupils are equal, round, and reactive to light.  Neck:     Musculoskeletal: Normal range of motion and neck supple.     Vascular: No carotid bruit or JVD.  Cardiovascular:     Rate and Rhythm: Normal rate and regular rhythm.     Heart sounds: Normal heart sounds.  Pulmonary:     Effort:  Pulmonary effort is normal. No respiratory distress.     Breath sounds: Normal breath sounds. No wheezing or rales.  Chest:     Chest wall: No tenderness.  Abdominal:     General: Bowel sounds are normal. There is no distension or abdominal bruit.     Palpations: Abdomen is soft. There is no hepatomegaly, splenomegaly, mass or pulsatile mass.     Tenderness: There is no abdominal tenderness.     Comments: Colostomy stump pink and moist  Musculoskeletal: Normal range of motion.  Lymphadenopathy:     Cervical: No cervical adenopathy.  Skin:    General: Skin is warm and dry.  Neurological:     Mental Status: She is alert and oriented to person, place, and time.     Deep Tendon Reflexes: Reflexes are normal and symmetric.  Psychiatric:        Behavior: Behavior normal.        Thought Content: Thought content normal.        Judgment: Judgment normal.    BP 125/71   Pulse 67   Temp 97.7 F (36.5 C) (Temporal)   Resp 20   Ht _0  (1.6 m)   Wt 139 lb (63 kg)   LMP 07/09/1975   SpO2 100%   BMI 24.62 kg/m         Assessment & Plan:  Sheri Russell comes in today with chief complaint of Medical Management of Chronic Issues   Diagnosis and orders addressed:  1. Essential hypertension, benign Low sodium diet - hydrochlorothiazide (HYDRODIURIL) 25 MG tablet; Take 1 tablet (25 mg total) by mouth daily.  Dispense: 90 tablet; Refill: 1 - enalapril (VASOTEC) 20 MG tablet; Take 1 tablet (20 mg total) by mouth 2 (two) times daily.  Dispense: 180 tablet; Refill: 1 - CMP14+EGFR  2. Hyperlipidemia with target LDL less than 100 Low fat diet - Lipid panel  3. BMI 24.0-24.9, adult Discussed diet and exercise for person with BMI >25 Will recheck weight in 3-6 months   Labs pending Health Maintenance reviewed Diet and exercise encouraged  Follow up plan: 6 months   [place], FNP

## 2019-01-03 LAB — CMP14+EGFR
ALT: 28 IU/L (ref 0–32)
AST: 22 IU/L (ref 0–40)
Albumin/Globulin Ratio: 1.7 (ref 1.2–2.2)
Albumin: 4.7 g/dL (ref 3.7–4.7)
Alkaline Phosphatase: 84 IU/L (ref 39–117)
BUN/Creatinine Ratio: 11 — ABNORMAL LOW (ref 12–28)
BUN: 10 mg/dL (ref 8–27)
Bilirubin Total: 0.4 mg/dL (ref 0.0–1.2)
CO2: 25 mmol/L (ref 20–29)
Calcium: 10.1 mg/dL (ref 8.7–10.3)
Chloride: 99 mmol/L (ref 96–106)
Creatinine, Ser: 0.89 mg/dL (ref 0.57–1.00)
GFR calc Af Amer: 74 mL/min/{1.73_m2} (ref >59)
GFR calc non Af Amer: 65 mL/min/{1.73_m2} (ref >59)
Globulin, Total: 2.8 g/dL (ref 1.5–4.5)
Glucose: 110 mg/dL — ABNORMAL HIGH (ref 65–99)
Potassium: 4.3 mmol/L (ref 3.5–5.2)
Sodium: 140 mmol/L (ref 134–144)
Total Protein: 7.5 g/dL (ref 6.0–8.5)

## 2019-01-03 LAB — LIPID PANEL
Chol/HDL Ratio: 2.4 ratio (ref 0.0–4.4)
Cholesterol, Total: 156 mg/dL (ref 100–199)
HDL: 65 mg/dL (ref >39)
LDL Chol Calc (NIH): 70 mg/dL (ref 0–99)
Triglycerides: 117 mg/dL (ref 0–149)
VLDL Cholesterol Cal: 21 mg/dL (ref 5–40)

## 2019-01-24 DIAGNOSIS — Z08 Encounter for follow-up examination after completed treatment for malignant neoplasm: Secondary | ICD-10-CM | POA: Diagnosis not present

## 2019-01-24 DIAGNOSIS — Z8544 Personal history of malignant neoplasm of other female genital organs: Secondary | ICD-10-CM | POA: Diagnosis not present

## 2019-01-24 DIAGNOSIS — Z79899 Other long term (current) drug therapy: Secondary | ICD-10-CM | POA: Diagnosis not present

## 2019-01-24 DIAGNOSIS — C52 Malignant neoplasm of vagina: Secondary | ICD-10-CM | POA: Diagnosis not present

## 2019-01-28 DIAGNOSIS — H40013 Open angle with borderline findings, low risk, bilateral: Secondary | ICD-10-CM | POA: Diagnosis not present

## 2019-02-11 DIAGNOSIS — Z1231 Encounter for screening mammogram for malignant neoplasm of breast: Secondary | ICD-10-CM | POA: Diagnosis not present

## 2019-05-05 ENCOUNTER — Other Ambulatory Visit: Payer: Self-pay | Admitting: Nurse Practitioner

## 2019-05-08 ENCOUNTER — Ambulatory Visit: Payer: Medicare Other

## 2019-05-29 DIAGNOSIS — Z23 Encounter for immunization: Secondary | ICD-10-CM | POA: Diagnosis not present

## 2019-06-27 DIAGNOSIS — Z23 Encounter for immunization: Secondary | ICD-10-CM | POA: Diagnosis not present

## 2019-06-30 ENCOUNTER — Ambulatory Visit: Payer: Self-pay | Admitting: Nurse Practitioner

## 2019-06-30 ENCOUNTER — Other Ambulatory Visit: Payer: Medicare Other

## 2019-07-18 ENCOUNTER — Other Ambulatory Visit: Payer: Self-pay

## 2019-07-18 ENCOUNTER — Ambulatory Visit (INDEPENDENT_AMBULATORY_CARE_PROVIDER_SITE_OTHER): Payer: Medicare Other | Admitting: Nurse Practitioner

## 2019-07-18 ENCOUNTER — Ambulatory Visit (INDEPENDENT_AMBULATORY_CARE_PROVIDER_SITE_OTHER): Payer: Medicare Other

## 2019-07-18 ENCOUNTER — Encounter: Payer: Self-pay | Admitting: Nurse Practitioner

## 2019-07-18 VITALS — BP 130/75 | HR 69 | Temp 98.0°F | Resp 20 | Ht 63.0 in | Wt 144.0 lb

## 2019-07-18 DIAGNOSIS — Z780 Asymptomatic menopausal state: Secondary | ICD-10-CM | POA: Diagnosis not present

## 2019-07-18 DIAGNOSIS — E2839 Other primary ovarian failure: Secondary | ICD-10-CM

## 2019-07-18 DIAGNOSIS — E785 Hyperlipidemia, unspecified: Secondary | ICD-10-CM

## 2019-07-18 DIAGNOSIS — I10 Essential (primary) hypertension: Secondary | ICD-10-CM

## 2019-07-18 DIAGNOSIS — C52 Malignant neoplasm of vagina: Secondary | ICD-10-CM

## 2019-07-18 DIAGNOSIS — R3915 Urgency of urination: Secondary | ICD-10-CM

## 2019-07-18 MED ORDER — ENALAPRIL MALEATE 20 MG PO TABS: 20.0000 mg | ORAL_TABLET | Freq: Two times a day (BID) | ORAL | 1 refills | Status: DC

## 2019-07-18 MED ORDER — HYDROCHLOROTHIAZIDE 25 MG PO TABS: 25.0000 mg | ORAL_TABLET | Freq: Every day | ORAL | 1 refills | Status: DC

## 2019-07-18 NOTE — Progress Notes (Signed)
Subjective:    Patient ID: Sheri Russell, female    DOB: 1945/12/15, 74 y.o.   MRN: [id number]   Chief Complaint: Medical Management of Chronic Issues    HPI:  1. Essential hypertension, benign No c/o chest pain, sob or headache. Does not check blood pressure at home. BP Readings from Last 3 Encounters:  01/01/19 125/71  09/13/18 136/85  03/19/18 135/76     2. Hyperlipidemia with target LDL less than 100 Does not watch diet and does very little exercise. Lab Results  Component Value Date   CHOL 156 01/01/2019   HDL 65 01/01/2019   LDLCALC 70 01/01/2019   TRIG 117 01/01/2019   CHOLHDL 2.4 01/01/2019     3. 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 Urinary urgency Started since she had cancer surgery. She does not want to do anything about it.    Outpatient Encounter Medications as of 07/18/2019  Medication Sig  . enalapril (VASOTEC) 20 MG tablet Take 1 tablet (20 mg total) by mouth 2 (two) times daily.  . hydrochlorothiazide (HYDRODIURIL) 25 MG tablet Take 1 tablet (25 mg total) by mouth daily.  . meclizine (ANTIVERT) 25 MG tablet TAKE 1 TABLET BY MOUTH THREE TIMES DAILY AS NEEDED FOR DIZZINESS  . ondansetron (ZOFRAN) 4 MG tablet TAKE 1 TABLET BY MOUTH EVERY 8 HOURS AS NEEDED FOR NAUSEA AND VOMITING  . VENTOLIN HFA 108 (90 Base) MCG/ACT inhaler INHALE 2 PUFFS BY MOUTH EVERY 6 HOURS AS NEEDED FOR WHEEZING OR SHORTNESS OF BREATH     Past Surgical History:  Procedure Laterality Date  . APPENDECTOMY  1986  . CHOLECYSTECTOMY  1985  . colostomy bag    . Several cancer surgeries  1971, [place]  . TOTAL ABDOMINAL HYSTERECTOMY  1986    Family History  Problem Relation Age of Onset  . Diabetes Mother   . Hypertension Mother   . Heart attack Mother   . Heart disease Mother   . Stroke Mother   . Diabetes Father   . Hypertension Father   . Heart attack Father   . Heart disease Father   .  Diabetes Maternal Grandmother   . Hypertension Maternal Grandmother   . Heart attack Maternal Grandmother   . Diabetes Maternal Grandfather   . Hypertension Maternal Grandfather   . Heart attack Maternal Grandfather   . Diabetes Paternal Grandmother   . Hypertension Paternal Grandmother   . Heart attack Paternal Grandmother   . Diabetes Paternal Grandfather   . Hypertension Paternal Grandfather   . Heart attack Paternal Grandfather   . Stroke Brother   . Asthma Brother   . Liver cancer Brother   . Cancer Sister     New complaints: No complaints  Social history: Lives by hereself  Controlled substance contract: n/a    Review of Systems  Constitutional: Negative for diaphoresis.  Eyes: Negative for pain.  Respiratory: Negative for shortness of breath.   Cardiovascular: Negative for chest pain, palpitations and leg swelling.  Gastrointestinal: Negative for abdominal pain.  Endocrine: Negative for polydipsia.  Skin: Negative for rash.  Neurological: Negative for dizziness, weakness and headaches.  Hematological: Does not bruise/bleed easily.  All other systems reviewed and are negative.      Objective:   Physical Exam Vitals and nursing note reviewed.  Constitutional:      General: She is not in acute distress.    Appearance: Normal appearance.  She is well-developed.  HENT:     Head: Normocephalic.     Nose: Nose normal.  Eyes:     Pupils: Pupils are equal, round, and reactive to light.  Neck:     Vascular: No carotid bruit or JVD.  Cardiovascular:     Rate and Rhythm: Normal rate and regular rhythm.     Heart sounds: Normal heart sounds.  Pulmonary:     Effort: Pulmonary effort is normal. No respiratory distress.     Breath sounds: Normal breath sounds. No wheezing or rales.  Chest:     Chest wall: No tenderness.  Abdominal:     General: Bowel sounds are normal. There is no distension or abdominal bruit.     Palpations: Abdomen is soft. There is no  hepatomegaly, splenomegaly, mass or pulsatile mass.     Tenderness: There is no abdominal tenderness.     Comments: Colostomy stump pink and moist- no sign of infection No skin breakdown around stoma  Musculoskeletal:        General: Normal range of motion.     Cervical back: Normal range of motion and neck supple.  Lymphadenopathy:     Cervical: No cervical adenopathy.  Skin:    General: Skin is warm and dry.  Neurological:     Mental Status: She is alert and oriented to person, place, and time.     Deep Tendon Reflexes: Reflexes are normal and symmetric.  Psychiatric:        Behavior: Behavior normal.        Thought Content: Thought content normal.        Judgment: Judgment normal.     BP 130/75   Pulse 69   Temp 98 F (36.7 C) (Temporal)   Resp 20   Ht _0  (1.6 m)   Wt 144 lb (65.3 kg)   LMP 07/09/1975   SpO2 97%   BMI 25.51 kg/m        Assessment & Plan:  Sheri Russell comes in today with chief complaint of Medical Management of Chronic Issues   Diagnosis and orders addressed:  1. Essential hypertension, benign Low sodium diet - hydrochlorothiazide (HYDRODIURIL) 25 MG tablet; Take 1 tablet (25 mg total) by mouth daily.  Dispense: 90 tablet; Refill: 1 - enalapril (VASOTEC) 20 MG tablet; Take 1 tablet (20 mg total) by mouth 2 (two) times daily.  Dispense: 180 tablet; Refill: 1 - CMP14+EGFR - CBC with Differential/Platelet  2. Hyperlipidemia with target LDL less than 100 Low fat diet - Lipid panel  3. Cancer of vagina [facility]) Keep follow up with oncology  4. Urinary urgency Force fkuids   Labs pending Health Maintenance reviewed Diet and exercise encouraged  Follow up plan: 6 months   [place], FNP

## 2019-07-18 NOTE — Patient Instructions (Signed)
Colostomy Home Guide, Adult  Colostomy surgery is done to create an opening in the front of the abdomen for stool (feces) to leave the body through an ostomy (stoma). Part of the large intestine is attached to the stoma. A bag, also called a pouch, is fitted over the stoma. Stool and gas will collect in the bag. After surgery, you will need to empty and change your colostomy bag as needed. You will also need to care for your stoma. How to care for the stoma Your stoma should look pink, red, and moist, like the inside of your cheek. Soon after surgery, the stoma may be swollen, but this swelling will go away within 6 weeks. To care for the stoma:  Keep the skin around the stoma clean and dry.  Use a clean, soft washcloth to gently wash the stoma and the skin around it. Clean using a circular motion, and wipe away from the stoma opening, not toward it. ? Use warm water and only use cleansers recommended by your health care provider. ? Rinse the stoma area with plain water. ? Dry the area around the stoma well.  Use stoma powder or ointment on your skin only as told by your health care provider. Do not use any other powders, gels, wipes, or creams on the skin around the stoma.  Check the stoma area every day for signs of infection. Check for: ? New or worsening redness, swelling, or pain. ? New or increased fluid or blood. ? Pus or warmth.  Measure the stoma opening regularly and record the size. Watch for changes. (It is normal for the stoma to get smaller as swelling goes away.) Share this information with your health care provider. How to empty the colostomy bag  Empty your bag at bedtime and whenever it is one-third to one-half full. Do not let the bag get more than half-full with stool or gas. The bag could leak if it gets too full. Some colostomy bags have a built-in gas release valve that releases gas often throughout the day. Follow these basic steps: 1. Wash your hands with soap and  water. 2. Sit far back on the toilet seat. 3. Put several pieces of toilet paper into the toilet water. This will prevent splashing as you empty stool into the toilet. 4. Remove the clip or the hook-and-loop fastener from the tail end of the bag. 5. Unroll the tail, then empty the stool into the toilet. 6. Clean the tail with toilet paper or a moist towelette. 7. Reroll the tail, and close it with the clip or the hook-and-loop fastener. 8. Wash your hands again. How to change the colostomy bag Change your bag every 3-4 days or as often as told by your health care provider. Also change the bag if it is leaking or separating from the skin, or if your skin around the stoma looks or feels irritated. Irritated skin may be a sign that the bag is leaking. Always have colostomy supplies with you, and follow these basic steps: 1. Wash your hands with soap and water. Have paper towels or tissues nearby to clean any discharge. 2. Remove the old bag and skin barrier. Use your fingers or a warm cloth to gently push the skin away from the barrier. 3. Clean the stoma area with water or with mild soap and water, as directed. Use water to rinse away any soap. 4. Dry the skin. You may use the cool setting on a hair dryer to do this.  5. Use a tracing pattern (template) to cut the skin barrier to the size needed. 6. If you are using a two-piece bag, attach the bag and the skin barrier to each other. Add the barrier ring, if you use one. 7. If directed, apply stoma powder or skin barrier gel to the skin. 8. Warm the skin barrier with your hands, or blow with a hair dryer for 5-10 seconds. 9. Remove the paper from the adhesive strip of the skin barrier. 10. Press the adhesive strip onto the skin around the stoma. 11. Gently rub the skin barrier onto the skin. This creates heat that helps the barrier to stick. 12. Apply stoma tape to the edges of the skin barrier, if desired. 95. Wash your hands again. General  recommendations  Avoid wearing tight clothes or having anything press directly on your stoma or bag. Change your clothing whenever it is soiled or damp.  You may shower or bathe with the bag on or off. Do not use harsh or oily soaps or lotions. Dry the skin and bag after bathing.  Store all supplies in a cool, dry place. Do not leave supplies in extreme heat because some parts can melt or not stick as well.  Whenever you leave home, take extra clothing and an extra skin barrier and bag with you.  If your bag gets wet, you can dry it with a hair dryer on the cool setting.  To prevent odor, you may put drops of ostomy deodorizer in the bag.  If recommended by your health care provider, put ostomy lubricant inside the bag. This helps stool to slide out of the bag more easily and completely. Contact a health care provider if:  You have new or worsening redness, swelling, or pain around your stoma.  You have new or increased fluid or blood coming from your stoma.  Your stoma feels warm to the touch.  You have pus coming from your stoma.  Your stoma extends in or out farther than normal.  You need to change your bag every day.  You have a fever. Get help right away if:  Your stool is bloody.  You have nausea or you vomit.  You have trouble breathing. Summary  Measure your stoma opening regularly and record the size. Watch for changes.  Empty your bag at bedtime and whenever it is one-third to one-half full. Do not let the bag get more than half-full with stool or gas.  Change your bag every 3-4 days or as often as told by your health care provider.  Whenever you leave home, take extra clothing and an extra skin barrier and bag with you. This information is not intended to replace advice given to you by your health care provider. Make sure you discuss any questions you have with your health care provider. Document Revised: 07/10/2018 Document Reviewed: 09/13/2016 Elsevier  Patient Education  [employer].

## 2019-07-19 LAB — CBC WITH DIFFERENTIAL/PLATELET
Basophils Absolute: 0.1 x10E3/uL (ref 0.0–0.2)
Basos: 1 %
EOS (ABSOLUTE): 0.3 x10E3/uL (ref 0.0–0.4)
Eos: 4 %
Hematocrit: 37.9 % (ref 34.0–46.6)
Hemoglobin: 12.4 g/dL (ref 11.1–15.9)
Immature Grans (Abs): 0 x10E3/uL (ref 0.0–0.1)
Immature Granulocytes: 0 %
Lymphocytes Absolute: 2.9 x10E3/uL (ref 0.7–3.1)
Lymphs: 41 %
MCH: 28.5 pg (ref 26.6–33.0)
MCHC: 32.7 g/dL (ref 31.5–35.7)
MCV: 87 fL (ref 79–97)
Monocytes Absolute: 0.5 x10E3/uL (ref 0.1–0.9)
Monocytes: 6 %
Neutrophils Absolute: 3.5 x10E3/uL (ref 1.4–7.0)
Neutrophils: 48 %
Platelets: 220 x10E3/uL (ref 150–450)
RBC: 4.35 x10E6/uL (ref 3.77–5.28)
RDW: 13 % (ref 11.7–15.4)
WBC: 7.2 x10E3/uL (ref 3.4–10.8)

## 2019-07-19 LAB — CMP14+EGFR
ALT: 31 IU/L (ref 0–32)
AST: 27 IU/L (ref 0–40)
Albumin/Globulin Ratio: 1.4 (ref 1.2–2.2)
Albumin: 4.4 g/dL (ref 3.7–4.7)
Alkaline Phosphatase: 79 IU/L (ref 39–117)
BUN/Creatinine Ratio: 13 (ref 12–28)
BUN: 12 mg/dL (ref 8–27)
Bilirubin Total: 0.4 mg/dL (ref 0.0–1.2)
CO2: 28 mmol/L (ref 20–29)
Calcium: 9.7 mg/dL (ref 8.7–10.3)
Chloride: 103 mmol/L (ref 96–106)
Creatinine, Ser: 0.92 mg/dL (ref 0.57–1.00)
GFR calc Af Amer: 71 mL/min/{1.73_m2} (ref >59)
GFR calc non Af Amer: 62 mL/min/{1.73_m2} (ref >59)
Globulin, Total: 3.1 g/dL (ref 1.5–4.5)
Glucose: 88 mg/dL (ref 65–99)
Potassium: 4.3 mmol/L (ref 3.5–5.2)
Sodium: 142 mmol/L (ref 134–144)
Total Protein: 7.5 g/dL (ref 6.0–8.5)

## 2019-07-19 LAB — LIPID PANEL
Chol/HDL Ratio: 2.4 ratio (ref 0.0–4.4)
Cholesterol, Total: 169 mg/dL (ref 100–199)
HDL: 69 mg/dL (ref >39)
LDL Chol Calc (NIH): 80 mg/dL (ref 0–99)
Triglycerides: 113 mg/dL (ref 0–149)
VLDL Cholesterol Cal: 20 mg/dL (ref 5–40)

## 2019-07-21 DIAGNOSIS — Z780 Asymptomatic menopausal state: Secondary | ICD-10-CM | POA: Diagnosis not present

## 2019-07-21 DIAGNOSIS — M8589 Other specified disorders of bone density and structure, multiple sites: Secondary | ICD-10-CM | POA: Diagnosis not present

## 2019-07-22 ENCOUNTER — Telehealth: Payer: Self-pay | Admitting: Nurse Practitioner

## 2019-07-22 NOTE — Chronic Care Management (AMB) (Signed)
  Chronic Care Management   Outreach Note  07/22/2019 Name: Natallia Kapner MRN: [id number] DOB: 11/28/1945  Teara Branam is a 74 y.o. year old female who is a primary care patient of [name], FNP. I reached out to [name] by phone today in response to a referral sent by Ms. Joaquim Lai Severino's health plan.     An unsuccessful telephone outreach was attempted today. The patient was referred to the case management team for assistance with care management and care coordination.   Follow Up Plan: A HIPPA compliant phone message was left for the patient providing contact information and requesting a return call.  The care management team will reach out to the patient again over the next 7 days.  If patient returns call to provider office, please advise to call [facility] at [address], [place], [place], [place] [address] Direct Dial: [phone] [email or web address] Website: [email or web address]

## 2019-07-23 ENCOUNTER — Ambulatory Visit (INDEPENDENT_AMBULATORY_CARE_PROVIDER_SITE_OTHER): Payer: Medicare Other | Admitting: *Deleted

## 2019-07-23 VITALS — BP 130/80 | Ht 63.0 in | Wt 144.0 lb

## 2019-07-23 DIAGNOSIS — Z0000 Encounter for general adult medical examination without abnormal findings: Secondary | ICD-10-CM | POA: Diagnosis not present

## 2019-07-23 NOTE — Patient Instructions (Signed)
  Sheri Russell , Thank you for taking time to come for your Medicare Wellness Visit. I appreciate your ongoing commitment to your health goals. Please review the following plan we discussed and let me know if I can assist you in the future.   These are the goals we discussed: Goals    . Exercise 3x per week (30 min per time)     Encourage increase in walking from 2 to 3 sessions per week for 30 minutes each time.     . Prevent falls       This is a list of the screening recommended for you and due dates:  Health Maintenance  Topic Date Due  . Flu Shot  11/02/2019  . Mammogram  02/11/2020  . Tetanus Vaccine  05/26/2020  . DEXA scan (bone density measurement)  Completed  . COVID-19 Vaccine  Completed  .  Hepatitis C: One time screening is recommended by Center for Disease Control  (CDC) for  adults born from [birth year] through 1965.   Completed  . Pneumonia vaccines  Completed    Keep follow up with Ronnald Collum as planned Make sure to check on your next Eye exam appointment Try not to put yourself at risk for falls.

## 2019-07-23 NOTE — Progress Notes (Signed)
MEDICARE ANNUAL WELLNESS VISIT  07/23/2019  Telephone Visit Disclaimer This Medicare AWV was conducted by telephone due to national recommendations for restrictions regarding the COVID-19 Pandemic (e.g. social distancing).  I verified, using two identifiers, that I am speaking with Sheri Russell or their authorized healthcare agent. I discussed the limitations, risks, security, and privacy concerns of performing an evaluation and management service by telephone and the potential availability of an in-person appointment in the future. The patient expressed understanding and agreed to proceed.   Subjective:  Sheri Russell is a 74 y.o. female patient of Sheri Russell, [place] who had a Medicare Annual Wellness Visit today via telephone. Sheri Russell is Retired and lives alone. she has 1 child. she reports that she is socially active and does interact with friends/family regularly. she is minimally physically active and enjoys sewing.  Patient Care Team: Sheri Pretty, FNP as PCP - General (Nurse Practitioner) Leticia Clas, [place] (Optometry)  Advanced Directives 07/23/2019 01/14/2018 01/08/2017 03/13/2014  Does Patient Have a Medical Advance Directive? Yes Yes No Yes  Type of Paramedic of [place];Living will Living will;Healthcare Power of [place];Living will -  Does patient want to make changes to medical advance directive? No - Patient declined Yes (MAU/Ambulatory/Procedural Areas - Information given) - -  Copy of [place] in Chart? No - copy requested No - copy requested No - copy requested No - copy requested    Hospital Utilization Over the Past 12 Months: # of hospitalizations or ER visits: 2 # of surgeries: 1  Review of Systems    Patient reports that her overall health is better compared to last year.  General ROS: negative  Patient Reported Readings (BP, Pulse, CBG, Weight, etc) BP 130/80   Ht  5\' 3"  (1.6 m)   Wt 144 lb (65.3 kg)   LMP 07/09/1975   BMI 25.51 kg/m    Pain Assessment       Current Medications & Allergies (verified) Allergies as of 07/23/2019      Reactions   Fish Allergy    Iohexol    IVP Dye    Pneumovax [pneumococcal Polysaccharide Vaccine]    Patient said this is not true    Iodinated Diagnostic Agents Rash   Other Hives, Rash   Seafood perch, red snapper any seafood with iodine      Medication List       Accurate as of July 23, 2019  9:47 AM. If you have any questions, ask your nurse or doctor.        STOP taking these medications   hydrochlorothiazide 25 MG tablet Commonly known as: HYDRODIURIL     TAKE these medications   enalapril 20 MG tablet Commonly known as: VASOTEC Take 1 tablet (20 mg total) by mouth 2 (two) times daily.   Fish Oil 1000 MG Cpdr Take 1-2 capsules by mouth daily.   meclizine 25 MG tablet Commonly known as: ANTIVERT TAKE 1 TABLET BY MOUTH THREE TIMES DAILY AS NEEDED FOR DIZZINESS   ondansetron 4 MG tablet Commonly known as: ZOFRAN TAKE 1 TABLET BY MOUTH EVERY 8 HOURS AS NEEDED FOR NAUSEA AND VOMITING   Ventolin HFA 108 (90 Base) MCG/ACT inhaler Generic drug: albuterol INHALE 2 PUFFS BY MOUTH EVERY 6 HOURS AS NEEDED FOR WHEEZING OR SHORTNESS OF BREATH       History (reviewed): Past Medical History:  Diagnosis Date  . Allergic rhinitis   . Anxiety   .  Asthma   . Bladder cancer ([place]) 1986  . Cancer [facility]) 1971   pelvic - oncologist -Dr. Paulo Fruit [facility] )  . Cervical cancer ([place])   . Colon polyps   . Hyperlipidemia   . Hypertension    Past Surgical History:  Procedure Laterality Date  . APPENDECTOMY  1986  . CHOLECYSTECTOMY  1985  . colostomy bag    . Several cancer surgeries  1971, [place]  . TOTAL ABDOMINAL HYSTERECTOMY  1986   Family History  Problem Relation Age of Onset  . Diabetes Mother   . Hypertension Mother   . Heart attack Mother   . Heart disease Mother    . Diabetes Father   . Hypertension Father   . Heart attack Father   . Heart disease Father   . Diabetes Maternal Grandmother   . Hypertension Maternal Grandmother   . Heart attack Maternal Grandmother   . Diabetes Maternal Grandfather   . Hypertension Maternal Grandfather   . Heart attack Maternal Grandfather   . Diabetes Paternal Grandmother   . Hypertension Paternal Grandmother   . Heart attack Paternal Grandmother   . Diabetes Paternal Grandfather   . Hypertension Paternal Grandfather   . Heart attack Paternal Grandfather   . Stroke Brother   . Hypertension Brother   . Asthma Brother   . Liver cancer Brother   . Alcohol abuse Brother   . Cancer Sister        colon cancer   . Diabetes Sister   . Diabetes Sister   . Diabetes Sister   . Hypertension Brother   . Diabetes Brother    Social History   Socioeconomic History  . Marital status: Widowed    Spouse name: Not on file  . Number of children: 1  . Years of education: 8  . Highest education level: 8th grade  Occupational History  . 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 Smoking status: Former Smoker    Types: Cigarettes    Quit date: 1978    Years since quitting: 43.3  . Smokeless tobacco: Never Used  Substance and Sexual Activity  . Alcohol use: Yes    Comment: occasionally   . Drug use: No  . Sexual activity: Not Currently  Other Topics Concern  . Not on file  Social History Narrative   Lives alone    Daughter in [place]   1 grandchild    Social Determinants of Health   Financial Resource Strain:   . Difficulty of Paying Living Expenses:   Food Insecurity:   . Worried About Charity fundraiser in the Last Year:   . Arboriculturist in the Last Year:   Transportation Needs:   . Film/video editor (Medical):   Marland Kitchen Lack of Transportation (Non-Medical):   Physical Activity:   . Days of Exercise per Week:   . Minutes of Exercise per Session:   Stress:   .  Feeling of Stress :   Social Connections:   . Frequency of Communication with Friends and Family:   . Frequency of Social Gatherings with Friends and Family:   . Attends Religious Services:   . Active Member of Clubs or Organizations:   . Attends Archivist Meetings:   Marland Kitchen Marital Status:     Activities of Daily Living In your present state of health, do you have any difficulty performing the following activities: 07/23/2019  Hearing? N  Vision? Y  Comment RX glasses  Difficulty concentrating or making decisions? N  Walking or climbing stairs? N  Dressing or bathing? N  Doing errands, shopping? N  Preparing Food and eating ? N  Using the Toilet? N  In the past six months, have you accidently leaked urine? N  Do you have problems with loss of bowel control? N  Managing your Medications? N  Managing your Finances? N  Housekeeping or managing your Housekeeping? N  Some recent data might be hidden    Patient Education/ Literacy    Exercise Current Exercise Habits: Home exercise routine, Type of exercise: strength training/weights;walking, Time (Minutes): 30, Frequency (Times/Week): 7, Weekly Exercise (Minutes/Week): 210, Intensity: Mild, Exercise limited by: None identified  Diet Patient reports consuming 2 meals a day and 1 snack(s) a day Patient reports that her primary diet is: Regular Patient reports that she does have regular access to food.   Depression Screen PHQ 2/9 Scores 07/23/2019 07/18/2019 01/01/2019 09/13/2018 03/19/2018 01/14/2018 12/18/2017  PHQ - 2 Score 0 0 0 0 0 0 0     Fall Risk Fall Risk  07/23/2019 07/18/2019 01/01/2019 09/13/2018 03/19/2018  Falls in the past year? 0 0 0 0 0  Comment - - - - -  Number falls in past yr: - - - - -  Injury with Fall? - - - - -  Comment - - - - -  Risk for fall due to : - - - - -  Risk for fall due to: Comment - - - - -  Follow up - - - - -     Objective:  Sheri Russell seemed alert and oriented and she  participated appropriately during our telephone visit.  Blood Pressure Weight BMI  BP Readings from Last 3 Encounters:  07/23/19 130/80  07/18/19 130/75  01/01/19 125/71   Wt Readings from Last 3 Encounters:  07/23/19 144 lb (65.3 kg)  07/18/19 144 lb (65.3 kg)  01/01/19 139 lb (63 kg)   BMI Readings from Last 1 Encounters:  07/23/19 25.51 kg/m    *Unable to obtain current vital signs, weight, and BMI due to telephone visit type  Hearing/Vision  . Holliann did not seem to have difficulty with hearing/understanding during the telephone conversation . Reports that she has not had a formal eye exam by an eye care professional within the past year . Reports that she has not had a formal hearing evaluation within the past year *Unable to fully assess hearing and vision during telephone visit type  Cognitive Function: 6CIT Screen 07/23/2019  What Year? 0 points  What month? 0 points  What time? 0 points  Count back from 20 0 points  Months in reverse 0 points  Repeat phrase 0 points  Total Score 0   (Normal:0-7, Significant for Dysfunction: >8)  Normal Cognitive Function Screening: Yes   Immunization & Health Maintenance Record Immunization History  Administered Date(s) Administered  . Influenza, High Dose Seasonal PF 01/31/2017, 01/14/2018  . Influenza,inj,Quad PF,6+ Mos 01/20/2013, 03/20/2016  . Moderna SARS-COVID-2 Vaccination 05/29/2019, 06/27/2019  . Pneumococcal Conjugate-13 07/29/2014  . Pneumococcal Polysaccharide-23 04/18/2010, 03/20/2016  . Tdap 05/26/2010    Health Maintenance  Topic Date Due  . INFLUENZA VACCINE  11/02/2019  . MAMMOGRAM  02/11/2020  . TETANUS/TDAP  05/26/2020  . DEXA SCAN  Completed  . COVID-19 Vaccine  Completed  . Hepatitis C Screening  Completed  . PNA vac Low Risk Adult  Completed  Assessment  This is a routine wellness examination for Leann Reynen.  Health Maintenance: Due or Overdue There are no preventive care reminders  to display for this patient.  Sheri Russell does not need a referral for Community Assistance: Care Management:   no Social Work:    no Prescription Assistance:  no Nutrition/Diabetes Education:  no   Plan:  Personalized Goals Goals Addressed            This Visit's Progress   . Exercise 3x per week (30 min per time)   On track    Encourage increase in walking from 2 to 3 sessions per week for 30 minutes each time.     . Prevent falls        Personalized Health Maintenance & Screening Recommendations  up to date   Lung Cancer Screening Recommended: no (Low Dose CT Chest recommended if Age 33-80 years, 30 pack-year currently smoking OR have quit w/in past 15 years) Hepatitis C Screening recommended: no HIV Screening recommended: no  Advanced Directives: Written information was not prepared per patient's request.  Referrals & Orders No orders of the defined types were placed in this encounter.   Follow-up Plan . Follow-up with Sheri Pretty, FNP as planned    I have personally reviewed and noted the following in the patient's chart:   . Medical and social history . Use of alcohol, tobacco or illicit drugs  . Current medications and supplements . Functional ability and status . Nutritional status . Physical activity . Advanced directives . List of other physicians . Hospitalizations, surgeries, and ER visits in previous 12 months . Vitals . Screenings to include cognitive, depression, and falls . Referrals and appointments  In addition, I have reviewed and discussed with Sheri Russell certain preventive protocols, quality metrics, and best practice recommendations. A written personalized care plan for preventive services as well as general preventive health recommendations is available and can be mailed to the patient at her request.      [place] Dec  07/23/2019

## 2019-07-24 ENCOUNTER — Other Ambulatory Visit: Payer: Self-pay | Admitting: Nurse Practitioner

## 2019-07-30 NOTE — Chronic Care Management (AMB) (Signed)
  Chronic Care Management   Note  07/30/2019 Name: Sheri Russell MRN: [id number] DOB: [birth year]  Sheri Russell is a 74 y.o. year old female who is a primary care patient of [name], FNP. I reached out to [name] by phone today in response to a referral sent by Ms. Joaquim Lai Persico's health plan.     Ms. Galla was given information about Chronic Care Management services today including:  1. CCM service includes personalized support from designated clinical staff supervised by her physician, including individualized plan of care and coordination with other care providers 2. 24/7 contact phone numbers for assistance for urgent and routine care needs. 3. Service will only be billed when office clinical staff spend 20 minutes or more in a month to coordinate care. 4. Only one practitioner may furnish and bill the service in a calendar month. 5. The patient may stop CCM services at any time (effective at the end of the month) by phone call to the office staff. 6. The patient will be responsible for cost sharing (co-pay) of up to 20% of the service fee (after annual deductible is met).  Patient agreed to services and verbal consent obtained.   Follow up plan: Telephone appointment with care management team member scheduled for:09/15/2019  Noreene Larsson, [place], [place], [place] [address] Direct Dial: [phone] [email or web address] Website: [email or web address]

## 2019-08-11 ENCOUNTER — Encounter: Payer: Self-pay | Admitting: Nurse Practitioner

## 2019-08-11 ENCOUNTER — Ambulatory Visit (INDEPENDENT_AMBULATORY_CARE_PROVIDER_SITE_OTHER): Payer: Medicare Other

## 2019-08-11 ENCOUNTER — Other Ambulatory Visit: Payer: Self-pay

## 2019-08-11 ENCOUNTER — Ambulatory Visit (INDEPENDENT_AMBULATORY_CARE_PROVIDER_SITE_OTHER): Payer: Medicare Other | Admitting: Nurse Practitioner

## 2019-08-11 VITALS — BP 158/79 | HR 75 | Temp 98.1°F | Ht 63.0 in | Wt 142.0 lb

## 2019-08-11 DIAGNOSIS — M25562 Pain in left knee: Secondary | ICD-10-CM | POA: Insufficient documentation

## 2019-08-11 MED ORDER — IBUPROFEN 600 MG PO TABS: 600.0000 mg | ORAL_TABLET | Freq: Three times a day (TID) | ORAL | 0 refills | Status: DC | PRN

## 2019-08-11 MED ORDER — DICLOFENAC SODIUM 1 % EX GEL: 2.0000 g | Freq: Four times a day (QID) | CUTANEOUS | 1 refills | Status: DC

## 2019-08-11 NOTE — Assessment & Plan Note (Addendum)
Patient left knee pain in not well managed, X-ray completed. provided eduction on resting the joint, ice application, compression and rest. Ibuprofen 600 mg,voltaren topical application.  follow up as needed.

## 2019-08-11 NOTE — Progress Notes (Signed)
Acute Office Visit  Subjective:    Patient ID: Sheri Russell, female    DOB: Oct 01, 1945, 74 y.o.   MRN: [id number]  Chief Complaint  Patient presents with  . Knee Pain    pt here today c/o left knee pain x 3 weeks     Patient is here for: Knee Pain  The incident occurred more than 1 week ago. The incident occurred at home. There was no injury mechanism. The pain is present in the left knee. The pain is at a severity of 7/10. The pain is severe. The pain has been intermittent since onset. The symptoms are aggravated by movement. She has tried heat for the symptoms. The treatment provided no relief.    Past Medical History:  Diagnosis Date  . Allergic rhinitis   . Anxiety   . Asthma   . Bladder cancer ([place]) 1986  . Cancer [facility]) 1971   pelvic - oncologist -Dr. Paulo Fruit [facility] )  . Cervical cancer ([place])   . Colon polyps   . Hyperlipidemia   . Hypertension     Past Surgical History:  Procedure Laterality Date  . APPENDECTOMY  1986  . CHOLECYSTECTOMY  1985  . colostomy bag    . Several cancer surgeries  1971, [place]  . TOTAL ABDOMINAL HYSTERECTOMY  1986    Family History  Problem Relation Age of Onset  . Diabetes Mother   . Hypertension Mother   . Heart attack Mother   . Heart disease Mother   . Diabetes Father   . Hypertension Father   . Heart attack Father   . Heart disease Father   . Diabetes Maternal Grandmother   . Hypertension Maternal Grandmother   . Heart attack Maternal Grandmother   . Diabetes Maternal Grandfather   . Hypertension Maternal Grandfather   . Heart attack Maternal Grandfather   . Diabetes Paternal Grandmother   . Hypertension Paternal Grandmother   . Heart attack Paternal Grandmother   . Diabetes Paternal Grandfather   . Hypertension Paternal Grandfather   . Heart attack Paternal Grandfather   . Stroke Brother   . Hypertension Brother   . Asthma Brother   . Liver cancer Brother   . Alcohol abuse Brother   .  Cancer Sister        colon cancer   . Diabetes Sister   . Diabetes Sister   . Diabetes Sister   . Hypertension Brother   . Diabetes Brother     Social History   Socioeconomic History  . Marital status: Widowed    Spouse name: Not on file  . Number of children: 1  . Years of education: 8  . Highest education level: 8th grade  Occupational History  . 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 Smoking status: Former Smoker    Types: Cigarettes    Quit date: 1978    Years since quitting: 43.3  . Smokeless tobacco: Never Used  Substance and Sexual Activity  . Alcohol use: Yes    Comment: occasionally   . Drug use: No  . Sexual activity: Not Currently  Other Topics Concern  . Not on file  Social History Narrative   Lives alone    Daughter in [place]   1 grandchild    Social Determinants of Health   Financial Resource Strain:   . Difficulty of Paying Living Expenses:   Food Insecurity:   . Worried  About Running Out of Food in the Last Year:   . [place] in the Last Year:   Transportation Needs:   . Lack of Transportation (Medical):   Marland Kitchen Lack of Transportation (Non-Medical):   Physical Activity:   . Days of Exercise per Week:   . Minutes of Exercise per Session:   Stress:   . Feeling of Stress :   Social Connections:   . Frequency of Communication with Friends and Family:   . Frequency of Social Gatherings with Friends and Family:   . Attends Religious Services:   . Active Member of Clubs or Organizations:   . Attends Archivist Meetings:   Marland Kitchen Marital Status:   Intimate Partner Violence:   . Fear of Current or Ex-Partner:   . Emotionally Abused:   Marland Kitchen Physically Abused:   . Sexually Abused:     Outpatient Medications Prior to Visit  Medication Sig Dispense Refill  . enalapril (VASOTEC) 20 MG tablet Take 1 tablet (20 mg total) by mouth 2 (two) times daily. 180 tablet 1  . meclizine (ANTIVERT) 25 MG tablet TAKE  1 TABLET BY MOUTH THREE TIMES DAILY AS NEEDED FOR DIZZINESS 30 tablet 2  . Omega-3 Fatty Acids (FISH OIL) 1000 MG CPDR Take 1-2 capsules by mouth daily.    . ondansetron (ZOFRAN) 4 MG tablet TAKE 1 TABLET BY MOUTH EVERY 8 HOURS AS NEEDED FOR NAUSEA AND VOMITING 20 tablet 0  . VENTOLIN HFA 108 (90 Base) MCG/ACT inhaler INHALE 2 PUFFS BY MOUTH EVERY 6 HOURS AS NEEDED FOR WHEEZING OR SHORTNESS OF BREATH 18 g 1   No facility-administered medications prior to visit.    Allergies  Allergen Reactions  . Fish Allergy   . Iohexol     IVP Dye   . Pneumovax [Pneumococcal Polysaccharide Vaccine]     Patient said this is not true   . Iodinated Diagnostic Agents Rash  . Other Hives and Rash    Seafood perch, red snapper any seafood with iodine    Review of Systems  Constitutional: Positive for activity change.  Respiratory: Negative for chest tightness and shortness of breath.   Cardiovascular: Negative for palpitations.  Genitourinary: Negative for difficulty urinating.  Musculoskeletal: Positive for joint swelling.       Left knee pain  Neurological: Negative.   Psychiatric/Behavioral: Negative.        Objective:    Physical Exam Constitutional:      Appearance: Normal appearance.  HENT:     Head: Normocephalic.  Eyes:     Conjunctiva/sclera: Conjunctivae normal.  Cardiovascular:     Rate and Rhythm: Normal rate.     Pulses: Normal pulses.     Heart sounds: Normal heart sounds.  Pulmonary:     Effort: Pulmonary effort is normal.     Breath sounds: Normal breath sounds.  Abdominal:     General: Bowel sounds are normal.  Musculoskeletal:        General: Tenderness present. No swelling, deformity or signs of injury.     Cervical back: Neck supple.     Comments: Left knee pain  Neurological:     Mental Status: She is alert and oriented to person, place, and time.  Psychiatric:        Mood and Affect: Mood normal.        Behavior: Behavior normal.     BP (!) 158/79    Pulse 75   Temp 98.1 F (36.7 C) (Temporal)  Ht 5\' 3"  (1.6 m)   Wt 142 lb (64.4 kg)   LMP 07/09/1975   BMI 25.15 kg/m  Wt Readings from Last 3 Encounters:  08/11/19 142 lb (64.4 kg)  07/23/19 144 lb (65.3 kg)  07/18/19 144 lb (65.3 kg)      No results found for: TSH Lab Results  Component Value Date   WBC 7.2 07/18/2019   HGB 12.4 07/18/2019   HCT 37.9 07/18/2019   MCV 87 07/18/2019   PLT 220 07/18/2019   Lab Results  Component Value Date   NA 142 07/18/2019   K 4.3 07/18/2019   CO2 28 07/18/2019   GLUCOSE 88 07/18/2019   BUN 12 07/18/2019   CREATININE 0.92 07/18/2019   BILITOT 0.4 07/18/2019   ALKPHOS 79 07/18/2019   AST 27 07/18/2019   ALT 31 07/18/2019   PROT 7.5 07/18/2019   ALBUMIN 4.4 07/18/2019   CALCIUM 9.7 07/18/2019   Lab Results  Component Value Date   CHOL 169 07/18/2019   Lab Results  Component Value Date   HDL 69 07/18/2019   Lab Results  Component Value Date   LDLCALC 80 07/18/2019   Lab Results  Component Value Date   TRIG 113 07/18/2019   Lab Results  Component Value Date   CHOLHDL 2.4 07/18/2019   No results found for: HGBA1C     Assessment & Plan:   Problem List Items Addressed This Visit      Other   Acute pain of left knee - Primary    Patient left knee pain in not well managed, X-ray completed. provided eduction on resting the joint, ice application, compression and rest. Ibuprofen 600 mg,voltaren topical application.  follow up as needed.       Relevant Orders   DG Knee 1-2 Views Left       No orders of the defined types were placed in this encounter.    Ivy Lynn, NP

## 2019-08-11 NOTE — Patient Instructions (Addendum)
Patient left knee pain in not well managed, X-ray completed. provided eduction on resting the joint, ice application, compression and rest. Ibuprofen 600 mg,voltaren topical application.  follow up as needed.   RICE Therapy for Routine Care of Injuries Many injuries can be cared for with rest, ice, compression, and elevation (RICE therapy). This includes:  Resting the injured part.  Putting ice on the injury.  Putting pressure (compression) on the injury.  Raising the injured part (elevation). Using RICE therapy can help to lessen pain and swelling. Supplies needed:  Ice.  Plastic bag.  Towel.  Elastic bandage.  Pillow or pillows to raise (elevate) your injured body part. How to care for your injury with RICE therapy Rest Limit your normal activities, and try not to use the injured part of your body. You can go back to your normal activities when your doctor says it is okay to do them and you feel okay. Ask your doctor if you should do exercises to help your injury get better. Ice Put ice on the injured area. Do not put ice on your bare skin.  Put ice in a plastic bag.  Place a towel between your skin and the bag.  Leave the ice on for 20 minutes, 2-3 times a day. Use ice on as many days as told by your doctor.  Compression Compression means putting pressure on the injured area. This can be done with an elastic bandage. If an elastic bandage has been put on your injury:  Do not wrap the bandage too tight. Wrap the bandage more loosely if part of your body away from the bandage is blue, swollen, cold, painful, or loses feeling (gets numb).  Take off the bandage and put it on again. Do this every 3-4 hours or as told by your doctor.  See your doctor if the bandage seems to make your problems worse.  Elevation Elevation means keeping the injured area raised. If you can, raise the injured area above your heart or the center of your chest. Contact a doctor if:  You keep  having pain and swelling.  Your symptoms get worse. Get help right away if:  You have sudden bad pain at your injury or lower than your injury.  You have redness or more swelling around your injury.  You have tingling or numbness at your injury or lower than your injury, and it does not go away when you take off the bandage. Summary  Many injuries can be cared for using rest, ice, compression, and elevation (RICE therapy).  You can go back to your normal activities when you feel okay and your doctor says it is okay.  Put ice on the injured area as told by your doctor.  Get help if your symptoms get worse or if you keep having pain and swelling. This information is not intended to replace advice given to you by your health care provider. Make sure you discuss any questions you have with your health care provider. Document Revised: 12/08/2016 Document Reviewed: 12/08/2016 Elsevier Patient Education  [place].

## 2019-09-15 ENCOUNTER — Ambulatory Visit: Payer: Medicare Other | Admitting: Licensed Clinical Social Worker

## 2019-09-15 DIAGNOSIS — E785 Hyperlipidemia, unspecified: Secondary | ICD-10-CM

## 2019-09-15 DIAGNOSIS — C52 Malignant neoplasm of vagina: Secondary | ICD-10-CM

## 2019-09-15 DIAGNOSIS — I10 Essential (primary) hypertension: Secondary | ICD-10-CM

## 2019-09-15 DIAGNOSIS — J4521 Mild intermittent asthma with (acute) exacerbation: Secondary | ICD-10-CM

## 2019-09-15 NOTE — Chronic Care Management (AMB) (Signed)
  Chronic Care Management    Clinical Social Work Follow Up Note  09/15/2019 Name: Suellen Durocher MRN: [id number] DOB: 1945/07/16  Jace Dowe is a 74 y.o. year old female who is a primary care patient of [name], [place]. The CCM team was consulted for assistance with [name] .   Review of patient status, including review of consultants reports, other relevant assessments, and collaboration with appropriate care team members and the patient's provider was performed as part of comprehensive patient evaluation and provision of chronic care management services.    SDOH (Social Determinants of Health) assessments performed: No   Outpatient Encounter Medications as of 09/15/2019  Medication Sig  . diclofenac Sodium (VOLTAREN) 1 % GEL Apply 2 g topically 4 (four) times daily.  . enalapril (VASOTEC) 20 MG tablet Take 1 tablet (20 mg total) by mouth 2 (two) times daily.  Marland Kitchen ibuprofen (ADVIL) 600 MG tablet Take 1 tablet (600 mg total) by mouth every 8 (eight) hours as needed.  . meclizine (ANTIVERT) 25 MG tablet TAKE 1 TABLET BY MOUTH THREE TIMES DAILY AS NEEDED FOR DIZZINESS  . Omega-3 Fatty Acids (FISH OIL) 1000 MG CPDR Take 1-2 capsules by mouth daily.  . ondansetron (ZOFRAN) 4 MG tablet TAKE 1 TABLET BY MOUTH EVERY 8 HOURS AS NEEDED FOR NAUSEA AND VOMITING  . VENTOLIN HFA 108 (90 Base) MCG/ACT inhaler INHALE 2 PUFFS BY MOUTH EVERY 6 HOURS AS NEEDED FOR WHEEZING OR SHORTNESS OF BREATH   No facility-administered encounter medications on file as of 09/15/2019.    LCSW called client home phone number and cell number several times today but LCSW was not able to speak via phone with client; however, LCSW did leave phone message for client requesting that she please return call to LCSW at [phone]   Follow Up Plan: LCSW to call client in next 4 weeks to talk with client about CCM program services  Norva Riffle.Eban Weick MSW, LCSW Licensed Clinical Social Worker [facility] Care [facility] [phone]

## 2019-09-15 NOTE — Patient Instructions (Addendum)
Licensed Clinical Social Worker Visit Information  Materials Provided: No  09/15/2019  Name: Sheri Russell MRN: [id number]       DOB: 27-Dec-1945  Sheri Russell is a 74 y.o. year old female who is a primary care patient of Sheri Russell, [place]. The CCM team was consulted for assistance with Sheri Russell .   Review of patient status, including review of consultants reports, other relevant assessments, and collaboration with appropriate care team members and the patient's provider was performed as part of comprehensive patient evaluation and provision of chronic care management services.    SDOH (Social Determinants of Health) assessments performed: No    LCSW called client home phone number and cell number several times today but LCSW was not able to speak via phone with client; however, LCSW did leave phone message for client requesting that she please return call to LCSW at [phone]   Follow Up Plan: LCSW to call client in next 4 weeks to talk with client about CCM program services  LCSW was not able to speak via phone with client today. Thus, the patient was not able to verbalize understanding of instructions provided today and was not able to accept or decline a print copy of patient instruction materials.   Sheri Russell.Sheri Russell MSW, LCSW Licensed Clinical Social Worker [facility] Care [facility] [phone]

## 2019-10-20 ENCOUNTER — Telehealth: Payer: Self-pay

## 2019-10-24 ENCOUNTER — Ambulatory Visit (INDEPENDENT_AMBULATORY_CARE_PROVIDER_SITE_OTHER): Payer: Medicare Other | Admitting: Licensed Clinical Social Worker

## 2019-10-24 DIAGNOSIS — E785 Hyperlipidemia, unspecified: Secondary | ICD-10-CM | POA: Diagnosis not present

## 2019-10-24 DIAGNOSIS — C52 Malignant neoplasm of vagina: Secondary | ICD-10-CM

## 2019-10-24 DIAGNOSIS — I10 Essential (primary) hypertension: Secondary | ICD-10-CM | POA: Diagnosis not present

## 2019-10-24 DIAGNOSIS — J4521 Mild intermittent asthma with (acute) exacerbation: Secondary | ICD-10-CM

## 2019-10-24 NOTE — Chronic Care Management (AMB) (Signed)
Chronic Care Management    Clinical Social Work Follow Up Note  10/24/2019 Name: Keyera Hattabaugh MRN: [id number] DOB: 08-30-1945  Luanne Krzyzanowski is a 74 y.o. year old female who is a primary care patient of [name], [place]. The CCM team was consulted for assistance with [name] .   Review of patient status, including review of consultants reports, other relevant assessments, and collaboration with appropriate care team members and the patient's provider was performed as part of comprehensive patient evaluation and provision of chronic care management services.    SDOH (Social Determinants of Health) assessments performed: Yes;risk for tobacco use; risk for depression; risk for stress  SDOH Interventions     Most Recent Value  SDOH Interventions  Depression Interventions/Treatment  --  [LCSW informed client or RNCM support and LCSW support]        Chronic Care Management from 10/24/2019 in [place]  PHQ-9 Total Score 3      GAD 7 : Generalized Anxiety Score 10/24/2019  Nervous, Anxious, on Edge 0  Control/stop worrying 0  Worry too much - different things 1  Trouble relaxing 1  Restless 0  Easily annoyed or irritable 0  Afraid - awful might happen 0  Total GAD 7 Score 2  Anxiety Difficulty Somewhat difficult    Outpatient Encounter Medications as of 10/24/2019  Medication Sig  . diclofenac Sodium (VOLTAREN) 1 % GEL Apply 2 g topically 4 (four) times daily.  . enalapril (VASOTEC) 20 MG tablet Take 1 tablet (20 mg total) by mouth 2 (two) times daily.  Marland Kitchen ibuprofen (ADVIL) 600 MG tablet Take 1 tablet (600 mg total) by mouth every 8 (eight) hours as needed.  . meclizine (ANTIVERT) 25 MG tablet TAKE 1 TABLET BY MOUTH THREE TIMES DAILY AS NEEDED FOR DIZZINESS  . Omega-3 Fatty Acids (FISH OIL) 1000 MG CPDR Take 1-2 capsules by mouth daily.  . ondansetron (ZOFRAN) 4 MG tablet TAKE 1 TABLET BY MOUTH EVERY 8 HOURS AS NEEDED FOR NAUSEA AND VOMITING   . VENTOLIN HFA 108 (90 Base) MCG/ACT inhaler INHALE 2 PUFFS BY MOUTH EVERY 6 HOURS AS NEEDED FOR WHEEZING OR SHORTNESS OF BREATH   No facility-administered encounter medications on file as of 10/24/2019.     Goals Addressed              This Visit's Progress   .  Client will talk with LCSW in next 30 days about medical needs of client and ADLs completion of client (pt-stated)        CARE PLAN ENTRY   Current Barriers:  . Patient with Chronic Diagnoses of HTN, HLD, Cancer of Vagina, Mild intermittent asthma with acute exacerbation  Clinical Social Work Clinical Goal(s):  Marland Kitchen LCSW will talk with client in next 30 days about medical needs of client and ADLs completion daily for client  Interventions:  . Talked with client about CCM program services . Talked with client about upcoming medical appointments . Talked with client about ambulation needs of client . [place] dwith client about transport needs of client . Talked with client about pain issues of client . Talked with client about vision needs of client . Talked with client about social support (daughter is supportive ) . Talked with client about [place] support with CCM program . Talked with client about ADLs completion daily . Talked with client about mood of client . Completed PHQ 2/9; completed GAD-7  Patient Self Care Activities:   Completes ADLs independently Attends  scheduled medical appointments Drives car to appointments and to complete errands   Patient Self Care Deficits:   Asthma  Initial goal documentation       Follow Up Plan:  LCSW will call client in next 30 days to talk with her about medical needs of client and to talk with her about her completion of daily ADLs  Norva Riffle.Casee Knepp MSW, LCSW Licensed Clinical Social Worker [facility] Care [facility] [phone]

## 2019-10-24 NOTE — Patient Instructions (Addendum)
Licensed Clinical Social Worker Visit Information  Goals we discussed today:  Goals Addressed              This Visit's Progress   .  Client will talk with LCSW in next 30 days about medical needs of client and ADLs completion of client (pt-stated)        CARE PLAN ENTRY   Current Barriers:  . Patient with Chronic Diagnoses of HTN, HLD, Cancer of Vagina, Mild intermittent asthma with acute exacerbation  Clinical Social Work Clinical Goal(s):  Marland Kitchen LCSW will talk with client in next 30 days about medical needs of client and ADLs completion daily for client  Interventions:  . Talked with client about CCM program services . Talked with client about upcoming medical appointments . Talked with client about ambulation needs of client . [place] dwith client about transport needs of client . Talked with client about pain issues of client . Talked with client about vision needs of client . Talked with client about social support (daughter is supportive ) . Talked with client about [place] support with CCM program . Talked with client about ADLs completion daily . Talked with client about mood of client . Completed PHQ 2/9; completed GAD-7  Patient Self Care Activities:   Completes ADLs independently Attends scheduled medical appointments Drives car to appointments and to complete errands   Patient Self Care Deficits:   Asthma  Initial goal documentation       Materials Provided: No  Follow Up Plan:  LCSW will call client in next 30 days to talk with her about medical needs of client and to talk with her about her completion of daily ADLs  The patient verbalized understanding of instructions provided today and declined a print copy of patient instruction materials.   Norva Riffle.Celesta Funderburk MSW, LCSW Licensed Clinical Social Worker [facility] [phone]

## 2019-11-27 ENCOUNTER — Ambulatory Visit (INDEPENDENT_AMBULATORY_CARE_PROVIDER_SITE_OTHER): Payer: Medicare Other | Admitting: Licensed Clinical Social Worker

## 2019-11-27 DIAGNOSIS — I10 Essential (primary) hypertension: Secondary | ICD-10-CM | POA: Diagnosis not present

## 2019-11-27 DIAGNOSIS — C52 Malignant neoplasm of vagina: Secondary | ICD-10-CM

## 2019-11-27 DIAGNOSIS — J4521 Mild intermittent asthma with (acute) exacerbation: Secondary | ICD-10-CM

## 2019-11-27 DIAGNOSIS — E785 Hyperlipidemia, unspecified: Secondary | ICD-10-CM

## 2019-11-27 NOTE — Patient Instructions (Addendum)
Licensed Clinical Education officer, museum Visit Information  Goals we discussed today:     Client will talk with LCSW in next 30 days about medical needs of client and ADLs completion of client (pt-stated)        CARE PLAN ENTRY   Current Barriers:   Patient with Chronic Diagnoses of HTN, HLD, Cancer of Vagina, Mild intermittent asthma with acute exacerbation  Clinical Social Work Clinical Goal(s):   LCSW will talk with client in next 30 days about medical needs of client and ADLs completion daily for client  Interventions:   Talked with client about CCM program services  Talked with client about upcoming medical appointments  Talked with client about ambulation needs of client  [place] dwith client about transport needs of client  Talked with client about pain issues of client  Talked with client about vision needs of client  Talked with client about social support (daughter is supportive )  Talked with client about RNCM nursing support with CCM program  Talked with client about ADLs completion daily  Talked with client about mood of client  Talked with client about relaxation activities (she loves to cook, likes to shop, likes to go out to eat with friends and family)  Talked with client about health needs of her sister  Patient Self Care Activities:   Completes ADLs independently Attends scheduled medical appointments Drives car to appointments and to complete errands   Patient Self Care Deficits:   Challenges in managing Asthma  Initial goal documentation    Follow Up Plan: LCSW will call client in next 30 days to talk with her about medical needs of client and to talk with her about her completion of daily ADLs  Materials Provided: No  The patient verbalized understanding of instructions provided today and declined a print copy of patient instruction materials.   Norva Riffle.Rand Boller MSW, LCSW Licensed Clinical Social Worker [facility] Care  Management [phone]

## 2019-11-27 NOTE — Chronic Care Management (AMB) (Signed)
Chronic Care Management    Clinical Social Work Follow Up Note  11/27/2019 Name: Sheri Russell MRN: [id number] DOB: 03/13/1946  Sheri Russell is a 74 y.o. year old female who is a primary care patient of [name], [place]. The CCM team was consulted for assistance with [name] .   Review of patient status, including review of consultants reports, other relevant assessments, and collaboration with appropriate care team members and the patient's provider was performed as part of comprehensive patient evaluation and provision of chronic care management services.    SDOH (Social Determinants of Health) assessments performed: No; risk for tobacco use; risk for depression; risk for stress; risk for physical inactivity    Chronic Care Management from 10/24/2019 in [place]  PHQ-9 Total Score 3     GAD 7 : Generalized Anxiety Score 10/24/2019  Nervous, Anxious, on Edge 0  Control/stop worrying 0  Worry too much - different things 1  Trouble relaxing 1  Restless 0  Easily annoyed or irritable 0  Afraid - awful might happen 0  Total GAD 7 Score 2  Anxiety Difficulty Somewhat difficult    Outpatient Encounter Medications as of 11/27/2019  Medication Sig  . diclofenac Sodium (VOLTAREN) 1 % GEL Apply 2 g topically 4 (four) times daily.  . enalapril (VASOTEC) 20 MG tablet Take 1 tablet (20 mg total) by mouth 2 (two) times daily.  Marland Kitchen ibuprofen (ADVIL) 600 MG tablet Take 1 tablet (600 mg total) by mouth every 8 (eight) hours as needed.  . meclizine (ANTIVERT) 25 MG tablet TAKE 1 TABLET BY MOUTH THREE TIMES DAILY AS NEEDED FOR DIZZINESS  . Omega-3 Fatty Acids (FISH OIL) 1000 MG CPDR Take 1-2 capsules by mouth daily.  . ondansetron (ZOFRAN) 4 MG tablet TAKE 1 TABLET BY MOUTH EVERY 8 HOURS AS NEEDED FOR NAUSEA AND VOMITING  . VENTOLIN HFA 108 (90 Base) MCG/ACT inhaler INHALE 2 PUFFS BY MOUTH EVERY 6 HOURS AS NEEDED FOR WHEEZING OR SHORTNESS OF BREATH   No  facility-administered encounter medications on file as of 11/27/2019.    Goals    .  Client will talk with LCSW in next 30 days about medical needs of client and ADLs completion of client (pt-stated)      CARE PLAN ENTRY   Current Barriers:  . Patient with Chronic Diagnoses of HTN, HLD, Cancer of Vagina, Mild intermittent asthma with acute exacerbation  Clinical Social Work Clinical Goal(s):  Marland Kitchen LCSW will talk with client in next 30 days about medical needs of client and ADLs completion daily for client  Interventions:  . Talked with client about CCM program services . Talked with client about upcoming medical appointments . Talked with client about ambulation needs of client . [place] dwith client about transport needs of client . Talked with client about pain issues of client . Talked with client about vision needs of client . Talked with client about social support (daughter is supportive ) . Talked with client about [place] support with CCM program . Talked with client about ADLs completion daily . Talked with client about mood of client . Talked with client about relaxation activities (she loves to cook, likes to shop, likes to go out to eat with friends and family) . Talked with client about health needs of her sister  Patient Self Care Activities:   Completes ADLs independently Attends scheduled medical appointments Drives car to appointments and to complete errands   Patient Self Care Deficits:  Challenges in managing Asthma  Initial goal documentation    Follow Up Plan:   LCSW will call client in next 30 days to talk with her about medical needs of client and to talk with her about her completion of daily ADLs  Norva Riffle.Jullien Granquist MSW, LCSW Licensed Clinical Social Worker [facility] Care [facility] [phone]

## 2020-01-02 ENCOUNTER — Ambulatory Visit (INDEPENDENT_AMBULATORY_CARE_PROVIDER_SITE_OTHER): Payer: Medicare Other | Admitting: Licensed Clinical Social Worker

## 2020-01-02 DIAGNOSIS — J4521 Mild intermittent asthma with (acute) exacerbation: Secondary | ICD-10-CM

## 2020-01-02 DIAGNOSIS — E785 Hyperlipidemia, unspecified: Secondary | ICD-10-CM

## 2020-01-02 DIAGNOSIS — C52 Malignant neoplasm of vagina: Secondary | ICD-10-CM

## 2020-01-02 DIAGNOSIS — I10 Essential (primary) hypertension: Secondary | ICD-10-CM

## 2020-01-02 NOTE — Patient Instructions (Addendum)
Licensed Clinical Social Worker Visit Information  Goals we discussed today:   .  Client will talk with LCSW in next 30 days about medical needs of client and ADLs completion of client (pt-stated)      CARE PLAN ENTRY  Current Barriers:  . Patient with Chronic Diagnoses of HTN, HLD, Cancer of Vagina, Mild intermittent asthma with acute exacerbation  Clinical Social Work Clinical Goal(s):  Marland Kitchen LCSW will talk with client in next 30 days about medical needs of client and ADLs completion daily for client  Interventions: . Talked with client about CCM program services . Talked with client about upcoming medical appointments . Talked with client about ambulation needs of client . [place] dwith client about transport needs of client . Talked with client about pain issues of client . Talked with client about vision needs of client . Talked with client about social support (daughter is supportive ) . Talked with client about [place] support with CCM program . Talked with client about ADLs completion daily . Talked with client about relaxation techniques (works with flowers, cares for her yard, watches Old Movies) . Talked with client about her next appointment with Cancer specialist (she said she goes to see Cancer specialist one time a year at present) . Talked with client about support from her sister in law and from her cousin  Patient Self Care Activities:   Completes ADLs independently Attends scheduled medical appointments Drives car to appointments and to complete errands  Patient Self Care Deficits:   Asthma challenges  Initial goal documentation    Follow Up Plan: LCSW will call client in next 30 days to talk with her about medical needs of client and to talk with her about her completion of daily ADLs  Materials Provided: No  The patient verbalized understanding of instructions provided today and declined a print copy of patient instruction materials.   Norva Riffle.Cassidee Deats  MSW, LCSW Licensed Clinical Social Worker [facility] [phone]

## 2020-01-02 NOTE — Chronic Care Management (AMB) (Signed)
Chronic Care Management    Clinical Social Work Follow Up Note  01/02/2020 Name: Sheri Russell MRN: [id number] DOB: [birth year]  Sheri Russell is a 74 y.o. year old female who is a primary care patient of [name], [place]. The CCM team was consulted for assistance with [name] .   Review of patient status, including review of consultants reports, other relevant assessments, and collaboration with appropriate care team members and the patient's provider was performed as part of comprehensive patient evaluation and provision of chronic care management services.    SDOH (Social Determinants of Health) assessments performed: No; risk for depression; risk for tobacco use; risk for stress; risk for physical inactivity    Chronic Care Management from 10/24/2019 in [place]  PHQ-9 Total Score 3       GAD 7 : Generalized Anxiety Score 10/24/2019  Nervous, Anxious, on Edge 0  Control/stop worrying 0  Worry too much - different things 1  Trouble relaxing 1  Restless 0  Easily annoyed or irritable 0  Afraid - awful might happen 0  Total GAD 7 Score 2  Anxiety Difficulty Somewhat difficult    Outpatient Encounter Medications as of 01/02/2020  Medication Sig  . diclofenac Sodium (VOLTAREN) 1 % GEL Apply 2 g topically 4 (four) times daily.  . enalapril (VASOTEC) 20 MG tablet Take 1 tablet (20 mg total) by mouth 2 (two) times daily.  Marland Kitchen ibuprofen (ADVIL) 600 MG tablet Take 1 tablet (600 mg total) by mouth every 8 (eight) hours as needed.  . meclizine (ANTIVERT) 25 MG tablet TAKE 1 TABLET BY MOUTH THREE TIMES DAILY AS NEEDED FOR DIZZINESS  . Omega-3 Fatty Acids (FISH OIL) 1000 MG CPDR Take 1-2 capsules by mouth daily.  . ondansetron (ZOFRAN) 4 MG tablet TAKE 1 TABLET BY MOUTH EVERY 8 HOURS AS NEEDED FOR NAUSEA AND VOMITING  . VENTOLIN HFA 108 (90 Base) MCG/ACT inhaler INHALE 2 PUFFS BY MOUTH EVERY 6 HOURS AS NEEDED FOR WHEEZING OR SHORTNESS OF BREATH   No  facility-administered encounter medications on file as of 01/02/2020.    Goals    .  Client will talk with LCSW in next 30 days about medical needs of client and ADLs completion of client (pt-stated)      CARE PLAN ENTRY   Current Barriers:  . Patient with Chronic Diagnoses of HTN, HLD, Cancer of Vagina, Mild intermittent asthma with acute exacerbation  Clinical Social Work Clinical Goal(s):  Marland Kitchen LCSW will talk with client in next 30 days about medical needs of client and ADLs completion daily for client  Interventions:  . Talked with client about CCM program services . Talked with client about upcoming medical appointments . Talked with client about ambulation needs of client . [place] dwith client about transport needs of client . Talked with client about pain issues of client . Talked with client about vision needs of client . Talked with client about social support (daughter is supportive ) . Talked with client about [place] support with CCM program . Talked with client about ADLs completion daily . Talked with client about relaxation techniques (works with flowers, cares for her yard, watches Old Movies) . Talked with client about her next appointment with Cancer specialist (she said she goes to see Cancer specialist one time a year at present) . Talked with client about support from her sister in law and from her cousin  Patient Self Care Activities:   Completes ADLs independently Attends scheduled medical  appointments Drives car to appointments and to complete errands  Patient Self Care Deficits:   Asthma challenges   Initial goal documentation    Follow Up Plan: LCSW will call client in next 30 days to talk with her about medical needs of client and to talk with her about her completion of daily ADLs  Norva Riffle.January Bergthold MSW, LCSW Licensed Clinical Social Worker [facility] Care Management [phone]

## 2020-01-19 ENCOUNTER — Ambulatory Visit: Payer: Self-pay | Admitting: Nurse Practitioner

## 2020-01-19 ENCOUNTER — Encounter: Payer: Self-pay | Admitting: Nurse Practitioner

## 2020-01-19 ENCOUNTER — Ambulatory Visit (INDEPENDENT_AMBULATORY_CARE_PROVIDER_SITE_OTHER): Payer: Medicare Other | Admitting: Nurse Practitioner

## 2020-01-19 ENCOUNTER — Other Ambulatory Visit: Payer: Self-pay

## 2020-01-19 VITALS — BP 139/81 | HR 69 | Temp 97.7°F | Resp 20 | Ht 63.0 in | Wt 140.0 lb

## 2020-01-19 DIAGNOSIS — E785 Hyperlipidemia, unspecified: Secondary | ICD-10-CM

## 2020-01-19 DIAGNOSIS — I10 Essential (primary) hypertension: Secondary | ICD-10-CM | POA: Diagnosis not present

## 2020-01-19 DIAGNOSIS — R3915 Urgency of urination: Secondary | ICD-10-CM

## 2020-01-19 DIAGNOSIS — F411 Generalized anxiety disorder: Secondary | ICD-10-CM

## 2020-01-19 DIAGNOSIS — Z23 Encounter for immunization: Secondary | ICD-10-CM

## 2020-01-19 MED ORDER — ENALAPRIL MALEATE 20 MG PO TABS: 20.0000 mg | ORAL_TABLET | Freq: Two times a day (BID) | ORAL | 1 refills | Status: DC

## 2020-01-19 MED ORDER — CITALOPRAM HYDROBROMIDE 20 MG PO TABS: 20.0000 mg | ORAL_TABLET | Freq: Every day | ORAL | 5 refills | Status: DC

## 2020-01-19 NOTE — Progress Notes (Signed)
Subjective:    Patient ID: Sheri Russell, female    DOB: 02/07/1946, 74 y.o.   MRN: [id number]   Chief Complaint: Medical Management of Chronic Issues    HPI:  1. Essential hypertension, benign No c/o chest pan, sob or headache. Does not check blood pressure at home. BP Readings from Last 3 Encounters:  01/19/20 139/81  08/11/19 (!) 158/79  07/23/19 130/80     2. Hyperlipidemia with target LDL less than 100 Does try to watch diet and walks 2-3 x a day. Lab Results  Component Value Date   CHOL 169 07/18/2019   HDL 69 07/18/2019   LDLCALC 80 07/18/2019   TRIG 113 07/18/2019   CHOLHDL 2.4 07/18/2019     3. Urinary urgency She says it is due her surgery and there is not much they can do about this.    Outpatient Encounter Medications as of 01/19/2020  Medication Sig  . diclofenac Sodium (VOLTAREN) 1 % GEL Apply 2 g topically 4 (four) times daily.  . enalapril (VASOTEC) 20 MG tablet Take 1 tablet (20 mg total) by mouth 2 (two) times daily.  Marland Kitchen ibuprofen (ADVIL) 600 MG tablet Take 1 tablet (600 mg total) by mouth every 8 (eight) hours as needed.  . meclizine (ANTIVERT) 25 MG tablet TAKE 1 TABLET BY MOUTH THREE TIMES DAILY AS NEEDED FOR DIZZINESS  . Omega-3 Fatty Acids (FISH OIL) 1000 MG CPDR Take 1-2 capsules by mouth daily.  . ondansetron (ZOFRAN) 4 MG tablet TAKE 1 TABLET BY MOUTH EVERY 8 HOURS AS NEEDED FOR NAUSEA AND VOMITING  . VENTOLIN HFA 108 (90 Base) MCG/ACT inhaler INHALE 2 PUFFS BY MOUTH EVERY 6 HOURS AS NEEDED FOR WHEEZING OR SHORTNESS OF BREATH   No facility-administered encounter medications on file as of 01/19/2020.    Past Surgical History:  Procedure Laterality Date  . APPENDECTOMY  1986  . CHOLECYSTECTOMY  1985  . colostomy bag    . Several cancer surgeries  1971, [facility]  . TOTAL ABDOMINAL HYSTERECTOMY  1986    Family History  Problem Relation Age of Onset  . Diabetes Mother   . Hypertension Mother   . Heart attack Mother   . Heart  disease Mother   . Diabetes Father   . Hypertension Father   . Heart attack Father   . Heart disease Father   . Diabetes Maternal Grandmother   . Hypertension Maternal Grandmother   . Heart attack Maternal Grandmother   . Diabetes Maternal Grandfather   . Hypertension Maternal Grandfather   . Heart attack Maternal Grandfather   . Diabetes Paternal Grandmother   . Hypertension Paternal Grandmother   . Heart attack Paternal Grandmother   . Diabetes Paternal Grandfather   . Hypertension Paternal Grandfather   . Heart attack Paternal Grandfather   . Stroke Brother   . Hypertension Brother   . Asthma Brother   . Liver cancer Brother   . Alcohol abuse Brother   . Cancer Sister        colon cancer   . Diabetes Sister   . Diabetes Sister   . Diabetes Sister   . Hypertension Brother   . Diabetes Brother     New complaints: anxiety level is high. Her sister is dying of cancer and her best friend is also dying of cancer. She is very stressed.  GAD 7 : Generalized Anxiety Score 01/19/2020 10/24/2019  Nervous, Anxious, on Edge 1 0  Control/stop worrying 0 0  Worry too  much - different things 0 1  Trouble relaxing 0 1  Restless 0 0  Easily annoyed or irritable 0 0  Afraid - awful might happen 0 0  Total GAD 7 Score 1 2  Anxiety Difficulty Not difficult at all Somewhat difficult      Social history: Lives by herself with her dogs  Controlled substance contract: n/a    Review of Systems  Constitutional: Negative for diaphoresis.  Eyes: Negative for pain.  Respiratory: Negative for shortness of breath.   Cardiovascular: Negative for chest pain, palpitations and leg swelling.  Gastrointestinal: Negative for abdominal pain.  Endocrine: Negative for polydipsia.  Skin: Negative for rash.  Neurological: Negative for dizziness, weakness and headaches.  Hematological: Does not bruise/bleed easily.  Psychiatric/Behavioral: The patient is nervous/anxious.   All other systems  reviewed and are negative.      Objective:   Physical Exam Vitals and nursing note reviewed.  Constitutional:      General: She is not in acute distress.    Appearance: Normal appearance. She is well-developed.  HENT:     Head: Normocephalic.     Nose: Nose normal.  Eyes:     Pupils: Pupils are equal, round, and reactive to light.  Neck:     Vascular: No carotid bruit or JVD.  Cardiovascular:     Rate and Rhythm: Normal rate and regular rhythm.     Heart sounds: Normal heart sounds.  Pulmonary:     Effort: Pulmonary effort is normal. No respiratory distress.     Breath sounds: Normal breath sounds. No wheezing or rales.  Chest:     Chest wall: No tenderness.  Abdominal:     General: Bowel sounds are normal. There is no distension or abdominal bruit.     Palpations: Abdomen is soft. There is no hepatomegaly, splenomegaly, mass or pulsatile mass.     Tenderness: There is no abdominal tenderness.     Comments: Colostomy stump pink and moist No skin break down around stump.  Musculoskeletal:        General: Normal range of motion.     Cervical back: Normal range of motion and neck supple.  Lymphadenopathy:     Cervical: No cervical adenopathy.  Skin:    General: Skin is warm and dry.  Neurological:     Mental Status: She is alert and oriented to person, place, and time.     Deep Tendon Reflexes: Reflexes are normal and symmetric.  Psychiatric:        Behavior: Behavior normal.        Thought Content: Thought content normal.        Judgment: Judgment normal.     BP 139/81   Pulse 69   Temp 97.7 F (36.5 C) (Temporal)   Resp 20   Ht 5' 3"  (1.6 m)   Wt 140 lb (63.5 kg)   LMP 07/09/1975   SpO2 99%   BMI 24.80 kg/m       Assessment & Plan:  Sheri Russell comes in today with chief complaint of Medical Management of Chronic Issues (Needs something mild for anxiety)   Diagnosis and orders addressed:  1. Essential hypertension, benign Low sodium diet -  enalapril (VASOTEC) 20 MG tablet; Take 1 tablet (20 mg total) by mouth 2 (two) times daily.  Dispense: 180 tablet; Refill: 1 - CBC with Differential/Platelet - CMP14+EGFR  2. Hyperlipidemia with target LDL less than 100 Low fat diet - Lipid panel  3. Urinary urgency Limit  fluid intake after 7 pm  4. GAD (generalized anxiety disorder) Stress management Medication side effects discussed - citalopram (CELEXA) 20 MG tablet; Take 1 tablet (20 mg total) by mouth daily.  Dispense: 30 tablet; Refill: 5   Labs pending Health Maintenance reviewed Diet and exercise encouraged  Follow up plan: 6 months   Mary-Margaret Hassell Done, FNP

## 2020-01-19 NOTE — Patient Instructions (Signed)
Stress, Adult Stress is a normal reaction to life events. Stress is what you feel when life demands more than you are used to, or more than you think you can handle. Some stress can be useful, such as studying for a test or meeting a deadline at work. Stress that occurs too often or for too long can cause problems. It can affect your emotional health and interfere with relationships and normal daily activities. Too much stress can weaken your body's defense system (immune system) and increase your risk for physical illness. If you already have a medical problem, stress can make it worse. What are the causes? All sorts of life events can cause stress. An event that causes stress for one person may not be stressful for another person. Major life events, whether positive or negative, commonly cause stress. Examples include:  Losing a job or starting a new job.  Losing a loved one.  Moving to a new town or home.  Getting married or divorced.  Having a baby.  Getting injured or sick. Less obvious life events can also cause stress, especially if they occur day after day or in combination with each other. Examples include:  Working long hours.  Driving in traffic.  Caring for children.  Being in debt.  Being in a difficult relationship. What are the signs or symptoms? Stress can cause emotional symptoms, including:  Anxiety. This is feeling worried, afraid, on edge, overwhelmed, or out of control.  Anger, including irritation or impatience.  Depression. This is feeling sad, down, helpless, or guilty.  Trouble focusing, remembering, or making decisions. Stress can cause physical symptoms, including:  Aches and pains. These may affect your head, neck, back, stomach, or other areas of your body.  Tight muscles or a clenched jaw.  Low energy.  Trouble sleeping. Stress can cause unhealthy behaviors, including:  Eating to feel better (overeating) or skipping meals.  Working too  much or putting off tasks.  Smoking, drinking alcohol, or using drugs to feel better. How is this diagnosed? Stress is diagnosed through an assessment by your health care provider. He or she may diagnose this condition based on:  Your symptoms and any stressful life events.  Your medical history.  Tests to rule out other causes of your symptoms. Depending on your condition, your health care provider may refer you to a specialist for further evaluation. How is this treated?  Stress management techniques are the recommended treatment for stress. Medicine is not typically recommended for the treatment of stress. Techniques to reduce your reaction to stressful life events include:  Stress identification. Monitor yourself for symptoms of stress and identify what causes stress for you. These skills may help you to avoid or prepare for stressful events.  Time management. Set your priorities, keep a calendar of events, and learn to say no. Taking these actions can help you avoid making too many commitments. Techniques for coping with stress include:  Rethinking the problem. Try to think realistically about stressful events rather than ignoring them or overreacting. Try to find the positives in a stressful situation rather than focusing on the negatives.  Exercise. Physical exercise can release both physical and emotional tension. The key is to find a form of exercise that you enjoy and do it regularly.  Relaxation techniques. These relax the body and mind. The key is to find one or more that you enjoy and use the techniques regularly. Examples include: ? Meditation, deep breathing, or progressive relaxation techniques. ? Yoga or   tai chi. ? Biofeedback, mindfulness techniques, or journaling. ? Listening to music, being out in nature, or participating in other hobbies.  Practicing a healthy lifestyle. Eat a balanced diet, drink plenty of water, limit or avoid caffeine, and get plenty of  sleep.  Having a strong support network. Spend time with family, friends, or other people you enjoy being around. Express your feelings and talk things over with someone you trust. Counseling or talk therapy with a mental health professional may be helpful if you are having trouble managing stress on your own. Follow these instructions at home: Lifestyle   Avoid drugs.  Do not use any products that contain nicotine or tobacco, such as cigarettes, e-cigarettes, and chewing tobacco. If you need help quitting, ask your health care provider.  Limit alcohol intake to no more than 1 drink a day for nonpregnant women and 2 drinks a day for men. One drink equals 12 oz of beer, 5 oz of wine, or 1 oz of hard liquor  Do not use alcohol or drugs to relax.  Eat a balanced diet that includes fresh fruits and vegetables, whole grains, lean meats, fish, eggs, and beans, and low-fat dairy. Avoid processed foods and foods high in added fat, sugar, and salt.  Exercise at least 30 minutes on 5 or more days each week.  Get 7-8 hours of sleep each night. General instructions   Practice stress management techniques as discussed with your health care provider.  Drink enough fluid to keep your urine clear or pale yellow.  Take over-the-counter and prescription medicines only as told by your health care provider.  Keep all follow-up visits as told by your health care provider. This is important. Contact a health care provider if:  Your symptoms get worse.  You have new symptoms.  You feel overwhelmed by your problems and can no longer manage them on your own. Get help right away if:  You have thoughts of hurting yourself or others. If you ever feel like you may hurt yourself or others, or have thoughts about taking your own life, get help right away. You can go to your nearest emergency department or call:  Your local emergency services (911 in the [place]  A suicide crisis helpline, such as the  [place] at (316) 250-6172. This is open 24 hours a day. Summary  Stress is a normal reaction to life events. It can cause problems if it happens too often or for too long.  Practicing stress management techniques is the best way to treat stress.  Counseling or talk therapy with a mental health professional may be helpful if you are having trouble managing stress on your own. This information is not intended to replace advice given to you by your health care provider. Make sure you discuss any questions you have with your health care provider. Document Revised: 10/18/2018 Document Reviewed: 05/10/2016 Elsevier Patient Education  [place].

## 2020-01-20 ENCOUNTER — Other Ambulatory Visit: Payer: Self-pay | Admitting: Nurse Practitioner

## 2020-01-20 DIAGNOSIS — I10 Essential (primary) hypertension: Secondary | ICD-10-CM

## 2020-01-20 LAB — CBC WITH DIFFERENTIAL/PLATELET
Basophils Absolute: 0.1 10*3/uL (ref 0.0–0.2)
Basos: 1 %
EOS (ABSOLUTE): 0.2 10*3/uL (ref 0.0–0.4)
Eos: 2 %
Hematocrit: 36.9 % (ref 34.0–46.6)
Hemoglobin: 12.6 g/dL (ref 11.1–15.9)
Immature Grans (Abs): 0 10*3/uL (ref 0.0–0.1)
Immature Granulocytes: 0 %
Lymphocytes Absolute: 2.9 10*3/uL (ref 0.7–3.1)
Lymphs: 36 %
MCH: 29.3 pg (ref 26.6–33.0)
MCHC: 34.1 g/dL (ref 31.5–35.7)
MCV: 86 fL (ref 79–97)
Monocytes Absolute: 0.5 10*3/uL (ref 0.1–0.9)
Monocytes: 6 %
Neutrophils Absolute: 4.3 10*3/uL (ref 1.4–7.0)
Neutrophils: 55 %
Platelets: 214 10*3/uL (ref 150–450)
RBC: 4.3 x10E6/uL (ref 3.77–5.28)
RDW: 13.3 % (ref 11.7–15.4)
WBC: 8 10*3/uL (ref 3.4–10.8)

## 2020-01-20 LAB — CMP14+EGFR
ALT: 28 IU/L (ref 0–32)
AST: 25 IU/L (ref 0–40)
Albumin/Globulin Ratio: 1.5 (ref 1.2–2.2)
Albumin: 4.4 g/dL (ref 3.7–4.7)
Alkaline Phosphatase: 72 IU/L (ref 44–121)
BUN/Creatinine Ratio: 13 (ref 12–28)
BUN: 12 mg/dL (ref 8–27)
Bilirubin Total: 0.3 mg/dL (ref 0.0–1.2)
CO2: 25 mmol/L (ref 20–29)
Calcium: 9.6 mg/dL (ref 8.7–10.3)
Chloride: 104 mmol/L (ref 96–106)
Creatinine, Ser: 0.93 mg/dL (ref 0.57–1.00)
GFR calc Af Amer: 70 mL/min/{1.73_m2} (ref >59)
GFR calc non Af Amer: 61 mL/min/{1.73_m2} (ref >59)
Globulin, Total: 2.9 g/dL (ref 1.5–4.5)
Glucose: 91 mg/dL (ref 65–99)
Potassium: 4.1 mmol/L (ref 3.5–5.2)
Sodium: 143 mmol/L (ref 134–144)
Total Protein: 7.3 g/dL (ref 6.0–8.5)

## 2020-01-20 LAB — LIPID PANEL
Chol/HDL Ratio: 2.8 ratio (ref 0.0–4.4)
Cholesterol, Total: 173 mg/dL (ref 100–199)
HDL: 62 mg/dL (ref >39)
LDL Chol Calc (NIH): 89 mg/dL (ref 0–99)
Triglycerides: 124 mg/dL (ref 0–149)
VLDL Cholesterol Cal: 22 mg/dL (ref 5–40)

## 2020-01-26 DIAGNOSIS — I10 Essential (primary) hypertension: Secondary | ICD-10-CM | POA: Diagnosis not present

## 2020-01-26 DIAGNOSIS — Z86010 Personal history of colonic polyps: Secondary | ICD-10-CM | POA: Diagnosis not present

## 2020-01-26 DIAGNOSIS — C52 Malignant neoplasm of vagina: Secondary | ICD-10-CM | POA: Diagnosis not present

## 2020-01-26 DIAGNOSIS — K649 Unspecified hemorrhoids: Secondary | ICD-10-CM | POA: Diagnosis not present

## 2020-01-26 DIAGNOSIS — Z79899 Other long term (current) drug therapy: Secondary | ICD-10-CM | POA: Diagnosis not present

## 2020-02-05 DIAGNOSIS — H401134 Primary open-angle glaucoma, bilateral, indeterminate stage: Secondary | ICD-10-CM | POA: Diagnosis not present

## 2020-02-09 ENCOUNTER — Ambulatory Visit: Payer: Medicare Other | Admitting: Licensed Clinical Social Worker

## 2020-02-09 DIAGNOSIS — E785 Hyperlipidemia, unspecified: Secondary | ICD-10-CM

## 2020-02-09 DIAGNOSIS — C52 Malignant neoplasm of vagina: Secondary | ICD-10-CM

## 2020-02-09 DIAGNOSIS — J4521 Mild intermittent asthma with (acute) exacerbation: Secondary | ICD-10-CM

## 2020-02-09 DIAGNOSIS — I10 Essential (primary) hypertension: Secondary | ICD-10-CM

## 2020-02-09 NOTE — Patient Instructions (Addendum)
Licensed Clinical Education officer, museum Visit Information  Goals we discussed today:  Client will talk with LCSW in next 30 days about medical needs of client and ADLs completion of client (pt-stated)           CARE PLAN ENTRY   Current Barriers:   Patient with Chronic Diagnoses of HTN, HLD, Cancer of Vagina, Mild intermittent asthma with acute exacerbation  Clinical Social Work Clinical Goal(s):   LCSW will talk with client in next 30 days about medical needs of client and ADLs completion daily for client  Interventions:   Talked with client about CCM program services  Talked with client about upcoming medical appointments  Talked with client about ambulation needs of client  Talke dwith client about transport needs of client  Talked with client about pain issues of client  Talked with client about vision needs of client  Talked with client about social support (daughter is supportive )  Talked with client about RNCM nursing support with CCM program  Talked with client about ADLs completion daily  Talked with client about mood of client  Talked with client about weekly exercises of client   Talked with client about recent death of her sister  Talked with client about part time job of client  Talked with client about her colostomy care  Talked with Joaquim Lai about support with Dr. Lottie Dawson, [facility] Pharmacist  Patient Self Care Activities:   Completes ADLs independently Attends scheduled medical appointments Drives car to appointments and to complete errands   Patient Self Care Deficits:   Asthma   Initial goal documentation      .     Follow Up Plan: LCSW will call client in next 30 days to talk with her about medical needs of client and to talk with her about her completion of daily ADLs  Materials Provided: No   The patient verbalized understanding of instructions provided today and declined a print copy of patient instruction  materials.   Norva Riffle.Darrick Greenlaw MSW, LCSW Licensed Clinical Social Worker [facility] Care Management [phone]

## 2020-02-09 NOTE — Chronic Care Management (AMB) (Signed)
Chronic Care Management    Clinical Social Work Follow Up Note  02/09/2020 Name: Johnanna Bakke MRN: [id number] DOB: 11-Sep-1945  Kierstin January is a 74 y.o. year old female who is a primary care patient of [name], [place]. The CCM team was consulted for assistance with [name] .   Review of patient status, including review of consultants reports, other relevant assessments, and collaboration with appropriate care team members and the patient's provider was performed as part of comprehensive patient evaluation and provision of chronic care management services.    SDOH (Social Determinants of Health) assessments performed: No;risk for depression ; risk for tobacco use; risk for stress; risk for physical inactivity    Chronic Care Management from 10/24/2019 in [place]  PHQ-9 Total Score 3       GAD 7 : Generalized Anxiety Score 01/19/2020 10/24/2019  Nervous, Anxious, on Edge 1 0  Control/stop worrying 0 0  Worry too much - different things 0 1  Trouble relaxing 0 1  Restless 0 0  Easily annoyed or irritable 0 0  Afraid - awful might happen 0 0  Total GAD 7 Score 1 2  Anxiety Difficulty Not difficult at all Somewhat difficult    Outpatient Encounter Medications as of 02/09/2020  Medication Sig  . citalopram (CELEXA) 20 MG tablet Take 1 tablet (20 mg total) by mouth daily.  . diclofenac Sodium (VOLTAREN) 1 % GEL Apply 2 g topically 4 (four) times daily. (Patient not taking: Reported on 01/19/2020)  . enalapril (VASOTEC) 20 MG tablet Take 1 tablet (20 mg total) by mouth 2 (two) times daily.  Marland Kitchen ibuprofen (ADVIL) 600 MG tablet Take 1 tablet (600 mg total) by mouth every 8 (eight) hours as needed.  . meclizine (ANTIVERT) 25 MG tablet TAKE 1 TABLET BY MOUTH THREE TIMES DAILY AS NEEDED FOR DIZZINESS  . Omega-3 Fatty Acids (FISH OIL) 1000 MG CPDR Take 1-2 capsules by mouth daily.  . ondansetron (ZOFRAN) 4 MG tablet TAKE 1 TABLET BY MOUTH EVERY 8  HOURS AS NEEDED FOR NAUSEA AND VOMITING  . VENTOLIN HFA 108 (90 Base) MCG/ACT inhaler INHALE 2 PUFFS BY MOUTH EVERY 6 HOURS AS NEEDED FOR WHEEZING OR SHORTNESS OF BREATH   No facility-administered encounter medications on file as of 02/09/2020.    Goals    .  Client will talk with LCSW in next 30 days about medical needs of client and ADLs completion of client (pt-stated)      CARE PLAN ENTRY   Current Barriers:  . Patient with Chronic Diagnoses of HTN, HLD, Cancer of Vagina, Mild intermittent asthma with acute exacerbation  Clinical Social Work Clinical Goal(s):  Marland Kitchen LCSW will talk with client in next 30 days about medical needs of client and ADLs completion daily for client  Interventions:  . Talked with client about CCM program services . Talked with client about upcoming medical appointments . Talked with client about ambulation needs of client . Joanna dwith client about transport needs of client . Talked with client about pain issues of client . Talked with client about vision needs of client . Talked with client about social support (daughter is supportive ) . Talked with client about [place] support with CCM program . Talked with client about ADLs completion daily . Talked with client about mood of client . Talked with client about weekly exercises of client  . Talked with client about recent death of her sister . Talked with client about part time  job of client . Talked with client about her colostomy care . Talked with Joaquim Lai about support with Dr. Lottie Dawson, [facility] Pharmacist  Patient Self Care Activities:   Completes ADLs independently Attends scheduled medical appointments Drives car to appointments and to complete errands   Patient Self Care Deficits:   Asthma   Initial goal documentation     .     Follow Up Plan:  LCSW will call client in next 30 days to talk with her about medical needs of client and to talk with her about her completion of daily  ADLs  Norva Riffle.Keyarah Mcroy MSW, LCSW Licensed Clinical Social Worker [facility] [phone]

## 2020-03-03 DIAGNOSIS — Z23 Encounter for immunization: Secondary | ICD-10-CM | POA: Diagnosis not present

## 2020-03-15 ENCOUNTER — Ambulatory Visit: Payer: Medicare Other | Admitting: Licensed Clinical Social Worker

## 2020-03-15 DIAGNOSIS — I10 Essential (primary) hypertension: Secondary | ICD-10-CM

## 2020-03-15 DIAGNOSIS — E785 Hyperlipidemia, unspecified: Secondary | ICD-10-CM

## 2020-03-15 DIAGNOSIS — J4521 Mild intermittent asthma with (acute) exacerbation: Secondary | ICD-10-CM

## 2020-03-15 DIAGNOSIS — C52 Malignant neoplasm of vagina: Secondary | ICD-10-CM

## 2020-03-15 NOTE — Patient Instructions (Addendum)
Licensed Clinical Education officer, museum Visit Information  Goals we discussed today:    Client will talk with LCSW in next 30 days about medical needs of client and ADLs completion of client (pt-stated)        CARE PLAN ENTRY   Current Barriers:   Patient with Chronic Diagnoses of HTN, HLD, Cancer of Vagina, Mild intermittent asthma with acute exacerbation  Clinical Social Work Clinical Goal(s):   LCSW will talk with client in next 30 days about medical needs of client and ADLs completion daily for client  Interventions:   Talked with client about recent death of her sister  Talked with client about CCM program services  Talked with client about upcoming medical appointments  Talked with client about ambulation needs of client  Talke dwith client about transport needs of client  Talked with client about pain issues of client  Talked with client about vision needs of client  Talked with client about social support (daughter is supportive )  Talked with client about RNCM nursing support with CCM program  Talked with client about ADLs completion daily  Talked with client about mood of client  Talked with client about exercise of client (likes to walk her dogs several times weekly)  Talked with client about upcoming medical appointments  Talked with client about relaxation techniques of client (walks her dogs, watch TV, works with flowers)  Talked with client about support for client's sister  Talked with client about sleeping issues of client  Talked with client about appetite of client  Talked with client about medication procurement of client  Encouraged client to talk with Dr. Lottie Dawson [facility] Pharmacist about medication questions of client (client spoke to LCSW about the cost of her inhaler)  Patient Self Care Activities:   Completes ADLs independently Attends scheduled medical appointments Drives car to appointments and to complete  errands   Patient Self Care Deficits:   Asthma  Initial goal documentation     Follow Up Plan:LCSW will call client in next 30 days to talk with her about medical needs of client and to talk with her about her completion of daily ADLs  Materials Provided: No  The patient verbalized understanding of instructions provided today and declined a print copy of patient instruction materials.   Norva Riffle.Xzavion Doswell MSW, LCSW Licensed Clinical Social Worker [facility] [phone]

## 2020-03-15 NOTE — Chronic Care Management (AMB) (Signed)
Chronic Care Management    Clinical Social Work Follow Up Note  03/15/2020 Name: Sheri Russell MRN: [id number] DOB: 1945/06/20  Sheri Russell is a 74 y.o. year old female who is a primary care patient of [name], [place]. The CCM team was consulted for assistance with [name] .   Review of patient status, including review of consultants reports, other relevant assessments, and collaboration with appropriate care team members and the patient's provider was performed as part of comprehensive patient evaluation and provision of chronic care management services.    SDOH (Social Determinants of Health) assessments performed: No; risk for tobacco use; risk for depression ; risk for stress; risk for financial challenges  Flowsheet Row Chronic Care Management from 10/24/2019 in [facility]  PHQ-9 Total Score 3     GAD 7 : Generalized Anxiety Score 01/19/2020 10/24/2019  Nervous, Anxious, on Edge 1 0  Control/stop worrying 0 0  Worry too much - different things 0 1  Trouble relaxing 0 1  Restless 0 0  Easily annoyed or irritable 0 0  Afraid - awful might happen 0 0  Total GAD 7 Score 1 2  Anxiety Difficulty Not difficult at all Somewhat difficult    Outpatient Encounter Medications as of 03/15/2020  Medication Sig  . citalopram (CELEXA) 20 MG tablet Take 1 tablet (20 mg total) by mouth daily.  . diclofenac Sodium (VOLTAREN) 1 % GEL Apply 2 g topically 4 (four) times daily. (Patient not taking: Reported on 01/19/2020)  . enalapril (VASOTEC) 20 MG tablet Take 1 tablet (20 mg total) by mouth 2 (two) times daily.  Marland Kitchen ibuprofen (ADVIL) 600 MG tablet Take 1 tablet (600 mg total) by mouth every 8 (eight) hours as needed.  . meclizine (ANTIVERT) 25 MG tablet TAKE 1 TABLET BY MOUTH THREE TIMES DAILY AS NEEDED FOR DIZZINESS  . Omega-3 Fatty Acids (FISH OIL) 1000 MG CPDR Take 1-2 capsules by mouth daily.  . ondansetron (ZOFRAN) 4 MG tablet TAKE 1 TABLET BY  MOUTH EVERY 8 HOURS AS NEEDED FOR NAUSEA AND VOMITING  . VENTOLIN HFA 108 (90 Base) MCG/ACT inhaler INHALE 2 PUFFS BY MOUTH EVERY 6 HOURS AS NEEDED FOR WHEEZING OR SHORTNESS OF BREATH   No facility-administered encounter medications on file as of 03/15/2020.    Goals    .  Client will talk with LCSW in next 30 days about medical needs of client and ADLs completion of client (pt-stated)      CARE PLAN ENTRY   Current Barriers:  . Patient with Chronic Diagnoses of HTN, HLD, Cancer of Vagina, Mild intermittent asthma with acute exacerbation  Clinical Social Work Clinical Goal(s):  Marland Kitchen LCSW will talk with client in next 30 days about medical needs of client and ADLs completion daily for client  Interventions:   Talked with client about recent death of her sister . Talked with client about CCM program services . Talked with client about upcoming medical appointments . Talked with client about ambulation needs of client . [place] dwith client about transport needs of client . Talked with client about pain issues of client . Talked with client about vision needs of client . Talked with client about social support (daughter is supportive ) . Talked with client about [facility] support with CCM program . Talked with client about ADLs completion daily . Talked with client about mood of client . Talked with client about exercise of client (likes to walk her dogs several times weekly)  Talked  with client about upcoming medical appointments  Talked with client about relaxation techniques of client (walks her dogs, watch TV, works with flowers)  Talked with client about support for client's sister  Talked with client about sleeping issues of client  Talked with client about appetite of client  Talked with client about medication procurement of client  Encouraged client to talk with Dr. Lottie Dawson [facility] Pharmacist about medication questions of client (client spoke to LCSW about the cost  of her inhaler)  Patient Self Care Activities:   Completes ADLs independently Attends scheduled medical appointments Drives car to appointments and to complete errands   Patient Self Care Deficits:   Asthma  Initial goal documentation     Follow Up Plan: LCSW will call client in next 30 days to talk with her about medical needs of client and to talk with her about her completion of daily ADLs  Norva Riffle.Vidit Boissonneault MSW, LCSW Licensed Clinical Social Worker [facility] Care Management [phone]

## 2020-04-19 ENCOUNTER — Telehealth: Payer: Medicare Other

## 2020-05-20 ENCOUNTER — Telehealth: Payer: Medicare Other

## 2020-06-21 ENCOUNTER — Telehealth: Payer: Medicare Other

## 2020-07-05 ENCOUNTER — Other Ambulatory Visit: Payer: Self-pay | Admitting: Nurse Practitioner

## 2020-07-09 DIAGNOSIS — I10 Essential (primary) hypertension: Secondary | ICD-10-CM | POA: Diagnosis not present

## 2020-07-09 DIAGNOSIS — R102 Pelvic and perineal pain: Secondary | ICD-10-CM | POA: Diagnosis not present

## 2020-07-09 DIAGNOSIS — Z86010 Personal history of colonic polyps: Secondary | ICD-10-CM | POA: Diagnosis not present

## 2020-07-09 DIAGNOSIS — Z9889 Other specified postprocedural states: Secondary | ICD-10-CM | POA: Diagnosis not present

## 2020-07-09 DIAGNOSIS — Z79899 Other long term (current) drug therapy: Secondary | ICD-10-CM | POA: Diagnosis not present

## 2020-07-09 DIAGNOSIS — C52 Malignant neoplasm of vagina: Secondary | ICD-10-CM | POA: Diagnosis not present

## 2020-07-20 ENCOUNTER — Ambulatory Visit (INDEPENDENT_AMBULATORY_CARE_PROVIDER_SITE_OTHER): Payer: Medicare Other | Admitting: Nurse Practitioner

## 2020-07-20 ENCOUNTER — Encounter: Payer: Self-pay | Admitting: Nurse Practitioner

## 2020-07-20 ENCOUNTER — Other Ambulatory Visit: Payer: Self-pay

## 2020-07-20 VITALS — BP 144/83 | HR 74 | Temp 97.6°F | Resp 20 | Ht 63.0 in | Wt 137.0 lb

## 2020-07-20 DIAGNOSIS — C52 Malignant neoplasm of vagina: Secondary | ICD-10-CM

## 2020-07-20 DIAGNOSIS — I10 Essential (primary) hypertension: Secondary | ICD-10-CM | POA: Diagnosis not present

## 2020-07-20 DIAGNOSIS — E785 Hyperlipidemia, unspecified: Secondary | ICD-10-CM | POA: Diagnosis not present

## 2020-07-20 MED ORDER — ENALAPRIL MALEATE 20 MG PO TABS: 20.0000 mg | ORAL_TABLET | Freq: Two times a day (BID) | ORAL | 1 refills | Status: DC

## 2020-07-20 NOTE — Patient Instructions (Signed)

## 2020-07-20 NOTE — Progress Notes (Signed)
Subjective:    Patient ID: Sheri Russell, female    DOB: 16-Sep-1945, 75 y.o.   MRN: [id number]   Chief Complaint: medical management of chronic issues     HPI:  1. Essential hypertension, benign No c/o chest pain, sob or headache. Does not check her blood pressure at home. BP Readings from Last 3 Encounters:  01/19/20 139/81  08/11/19 (!) 158/79  07/23/19 130/80     2. Hyperlipidemia with target LDL less than 100 Does try to watch diet and walks several times a week for exercise. Is not currently on a statin. Lab Results  Component Value Date   CHOL 173 01/19/2020   HDL 62 01/19/2020   LDLCALC 89 01/19/2020   TRIG 124 01/19/2020   CHOLHDL 2.8 01/19/2020   The 10-year ASCVD risk score Mikey Bussing DC Jr., et al., 2013) is: 25.6%   3. 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 Says she is doing well.    Outpatient Encounter Medications as of 07/20/2020  Medication Sig  . citalopram (CELEXA) 20 MG tablet Take 1 tablet (20 mg total) by mouth daily.  . diclofenac Sodium (VOLTAREN) 1 % GEL Apply 2 g topically 4 (four) times daily. (Patient not taking: Reported on 01/19/2020)  . enalapril (VASOTEC) 20 MG tablet Take 1 tablet (20 mg total) by mouth 2 (two) times daily.  Marland Kitchen ibuprofen (ADVIL) 600 MG tablet Take 1 tablet (600 mg total) by mouth every 8 (eight) hours as needed.  . meclizine (ANTIVERT) 25 MG tablet TAKE 1 TABLET BY MOUTH THREE TIMES DAILY AS NEEDED FOR DIZZINESS  . Omega-3 Fatty Acids (FISH OIL) 1000 MG CPDR Take 1-2 capsules by mouth daily.  . ondansetron (ZOFRAN) 4 MG tablet TAKE 1 TABLET BY MOUTH EVERY 8 HOURS AS NEEDED FOR NAUSEA AND VOMITING  . VENTOLIN HFA 108 (90 Base) MCG/ACT inhaler INHALE 2 PUFFS BY MOUTH EVERY 6 HOURS AS NEEDED FOR WHEEZING OR SHORTNESS OF BREATH   No facility-administered encounter medications on file as of 07/20/2020.    Past Surgical History:  Procedure Laterality Date  . APPENDECTOMY  1986  .  CHOLECYSTECTOMY  1985  . colostomy bag    . Several cancer surgeries  1971, [place]  . TOTAL ABDOMINAL HYSTERECTOMY  1986    Family History  Problem Relation Age of Onset  . Diabetes Mother   . Hypertension Mother   . Heart attack Mother   . Heart disease Mother   . Diabetes Father   . Hypertension Father   . Heart attack Father   . Heart disease Father   . Diabetes Maternal Grandmother   . Hypertension Maternal Grandmother   . Heart attack Maternal Grandmother   . Diabetes Maternal Grandfather   . Hypertension Maternal Grandfather   . Heart attack Maternal Grandfather   . Diabetes Paternal Grandmother   . Hypertension Paternal Grandmother   . Heart attack Paternal Grandmother   . Diabetes Paternal Grandfather   . Hypertension Paternal Grandfather   . Heart attack Paternal Grandfather   . Stroke Brother   . Hypertension Brother   . Asthma Brother   . Liver cancer Brother   . Alcohol abuse Brother   . Cancer Sister        colon cancer   . Diabetes Sister   . Diabetes Sister   . Diabetes Sister   . Hypertension Brother   . Diabetes Brother     New complaints: None today  Social  history: Lives by herself  Controlled substance contract: n/a    Review of Systems  Constitutional: Negative for diaphoresis.  Eyes: Negative for pain.  Respiratory: Negative for shortness of breath.   Cardiovascular: Negative for chest pain, palpitations and leg swelling.  Gastrointestinal: Negative for abdominal pain.  Endocrine: Negative for polydipsia.  Skin: Negative for rash.  Neurological: Negative for dizziness, weakness and headaches.  Hematological: Does not bruise/bleed easily.  All other systems reviewed and are negative.      Objective:   Physical Exam Vitals and nursing note reviewed.  Constitutional:      General: She is not in acute distress.    Appearance: Normal appearance. She is well-developed.  HENT:     Head: Normocephalic.     Nose: Nose  normal.  Eyes:     Pupils: Pupils are equal, round, and reactive to light.  Neck:     Vascular: No carotid bruit or JVD.  Cardiovascular:     Rate and Rhythm: Normal rate and regular rhythm.     Heart sounds: Normal heart sounds.  Pulmonary:     Effort: Pulmonary effort is normal. No respiratory distress.     Breath sounds: Normal breath sounds. No wheezing or rales.  Chest:     Chest wall: No tenderness.  Abdominal:     General: Abdomen is flat. Bowel sounds are normal. There is no distension or abdominal bruit.     Palpations: Abdomen is soft. There is no hepatomegaly, splenomegaly, mass or pulsatile mass.     Tenderness: There is no abdominal tenderness.     Comments: Colostomy stump pink and moist  Musculoskeletal:        General: Normal range of motion.     Cervical back: Normal range of motion and neck supple.  Lymphadenopathy:     Cervical: No cervical adenopathy.  Skin:    General: Skin is warm and dry.  Neurological:     Mental Status: She is alert and oriented to person, place, and time.     Deep Tendon Reflexes: Reflexes are normal and symmetric.  Psychiatric:        Behavior: Behavior normal.        Thought Content: Thought content normal.        Judgment: Judgment normal.    BP (!) 144/83   Pulse 74   Temp 97.6 F (36.4 C) (Temporal)   Resp 20   Ht 5' 3" (1.6 m)   Wt 137 lb (62.1 kg)   LMP 07/09/1975   SpO2 98%   BMI 24.27 kg/m        Assessment & Plan:  Loreta Blouch comes in today with chief complaint of Medical Management of Chronic Issues   Diagnosis and orders addressed:  1. Essential hypertension, benign Low sodium diet - enalapril (VASOTEC) 20 MG tablet; Take 1 tablet (20 mg total) by mouth 2 (two) times daily.  Dispense: 180 tablet; Refill: 1 - CBC with Differential/Platelet - CMP14+EGFR  2. Hyperlipidemia with target LDL less than 100 Low fat diet - Lipid panel  3. Cancer of vagina [facility]) Report any changes   Labs  pending Health Maintenance reviewed- patient will schedule mammmogram Diet and exercise encouraged  Follow up plan: 6 months   Mary-Margaret Hassell Done, FNP

## 2020-07-21 LAB — CMP14+EGFR
ALT: 23 IU/L (ref 0–32)
AST: 24 IU/L (ref 0–40)
Albumin/Globulin Ratio: 1.6 (ref 1.2–2.2)
Albumin: 4.6 g/dL (ref 3.7–4.7)
Alkaline Phosphatase: 81 IU/L (ref 44–121)
BUN/Creatinine Ratio: 16 (ref 12–28)
BUN: 14 mg/dL (ref 8–27)
Bilirubin Total: 0.3 mg/dL (ref 0.0–1.2)
CO2: 26 mmol/L (ref 20–29)
Calcium: 10 mg/dL (ref 8.7–10.3)
Chloride: 100 mmol/L (ref 96–106)
Creatinine, Ser: 0.88 mg/dL (ref 0.57–1.00)
Globulin, Total: 2.9 g/dL (ref 1.5–4.5)
Glucose: 87 mg/dL (ref 65–99)
Potassium: 4.2 mmol/L (ref 3.5–5.2)
Sodium: 141 mmol/L (ref 134–144)
Total Protein: 7.5 g/dL (ref 6.0–8.5)
eGFR: 68 mL/min/{1.73_m2} (ref >59)

## 2020-07-21 LAB — CBC WITH DIFFERENTIAL/PLATELET
Basophils Absolute: 0.1 10*3/uL (ref 0.0–0.2)
Basos: 1 %
EOS (ABSOLUTE): 0.2 10*3/uL (ref 0.0–0.4)
Eos: 2 %
Hematocrit: 39.3 % (ref 34.0–46.6)
Hemoglobin: 13 g/dL (ref 11.1–15.9)
Immature Grans (Abs): 0 10*3/uL (ref 0.0–0.1)
Immature Granulocytes: 0 %
Lymphocytes Absolute: 3.1 10*3/uL (ref 0.7–3.1)
Lymphs: 39 %
MCH: 28.6 pg (ref 26.6–33.0)
MCHC: 33.1 g/dL (ref 31.5–35.7)
MCV: 87 fL (ref 79–97)
Monocytes Absolute: 0.5 10*3/uL (ref 0.1–0.9)
Monocytes: 7 %
Neutrophils Absolute: 4 10*3/uL (ref 1.4–7.0)
Neutrophils: 51 %
Platelets: 228 10*3/uL (ref 150–450)
RBC: 4.54 x10E6/uL (ref 3.77–5.28)
RDW: 13.6 % (ref 11.7–15.4)
WBC: 7.8 10*3/uL (ref 3.4–10.8)

## 2020-07-21 LAB — LIPID PANEL
Chol/HDL Ratio: 2.5 ratio (ref 0.0–4.4)
Cholesterol, Total: 183 mg/dL (ref 100–199)
HDL: 72 mg/dL (ref >39)
LDL Chol Calc (NIH): 90 mg/dL (ref 0–99)
Triglycerides: 123 mg/dL (ref 0–149)
VLDL Cholesterol Cal: 21 mg/dL (ref 5–40)

## 2020-07-23 ENCOUNTER — Ambulatory Visit (INDEPENDENT_AMBULATORY_CARE_PROVIDER_SITE_OTHER): Payer: Medicare Other

## 2020-07-23 VITALS — Ht 62.0 in | Wt 138.0 lb

## 2020-07-23 DIAGNOSIS — Z0000 Encounter for general adult medical examination without abnormal findings: Secondary | ICD-10-CM

## 2020-07-23 NOTE — Patient Instructions (Signed)
Sheri Russell , Thank you for taking time to come for your Medicare Wellness Visit. I appreciate your ongoing commitment to your health goals. Please review the following plan we discussed and let me know if I can assist you in the future.   Screening recommendations/referrals: Colonoscopy: Done 07/18/2012 - no longer required Mammogram: Done 02/11/2019 - Scheduled for repeat in 10/2020 (repeat annually) Bone Density: Done 07/23/2019 - Repeat in 2 years Recommended yearly ophthalmology/optometry visit for glaucoma screening and checkup Recommended yearly dental visit for hygiene and checkup  Vaccinations: Influenza vaccine: Done 10/18//2021 - Repeat each fall Pneumococcal vaccine: Done 07/28/2014 & 03/20/2016 Tdap vaccine: Done 2/23//2012 Shingles vaccine: Shingrix discussed. Please contact your pharmacy for coverage information.    Covid-19: Done 05/29/2019, 06/27/2019, & 03/03/2020  Advanced directives: Please bring a copy of your health care power of attorney and living will to the office to be added to your chart at your convenience.  Conditions/risks identified: Aim for 30 minutes of exercise or brisk walking each day, drink 6-8 glasses of water and eat lots of fruits and vegetables.  Next appointment: Follow up in one year for your annual wellness visit    Preventive Care 65 Years and Older, Female Preventive care refers to lifestyle choices and visits with your health care provider that can promote health and wellness. What does preventive care include?  A yearly physical exam. This is also called an annual well check.  Dental exams once or twice a year.  Routine eye exams. Ask your health care provider how often you should have your eyes checked.  Personal lifestyle choices, including:  Daily care of your teeth and gums.  Regular physical activity.  Eating a healthy diet.  Avoiding tobacco and drug use.  Limiting alcohol use.  Practicing safe sex.  Taking low-dose aspirin  every day.  Taking vitamin and mineral supplements as recommended by your health care provider. What happens during an annual well check? The services and screenings done by your health care provider during your annual well check will depend on your age, overall health, lifestyle risk factors, and family history of disease. Counseling  Your health care provider may ask you questions about your:  Alcohol use.  Tobacco use.  Drug use.  Emotional well-being.  Home and relationship well-being.  Sexual activity.  Eating habits.  History of falls.  Memory and ability to understand (cognition).  Work and work Statistician.  Reproductive health. Screening  You may have the following tests or measurements:  Height, weight, and BMI.  Blood pressure.  Lipid and cholesterol levels. These may be checked every 5 years, or more frequently if you are over 68 years old.  Skin check.  Lung cancer screening. You may have this screening every year starting at age 43 if you have a 30-pack-year history of smoking and currently smoke or have quit within the past 15 years.  Fecal occult blood test (FOBT) of the stool. You may have this test every year starting at age 53.  Flexible sigmoidoscopy or colonoscopy. You may have a sigmoidoscopy every 5 years or a colonoscopy every 10 years starting at age 14.  Hepatitis C blood test.  Hepatitis B blood test.  Sexually transmitted disease (STD) testing.  Diabetes screening. This is done by checking your blood sugar (glucose) after you have not eaten for a while (fasting). You may have this done every 1-3 years.  Bone density scan. This is done to screen for osteoporosis. You may have this done starting  at age 68.  Mammogram. This may be done every 1-2 years. Talk to your health care provider about how often you should have regular mammograms. Talk with your health care provider about your test results, treatment options, and if necessary,  the need for more tests. Vaccines  Your health care provider may recommend certain vaccines, such as:  Influenza vaccine. This is recommended every year.  Tetanus, diphtheria, and acellular pertussis (Tdap, Td) vaccine. You may need a Td booster every 10 years.  Zoster vaccine. You may need this after age 59.  Pneumococcal 13-valent conjugate (PCV13) vaccine. One dose is recommended after age 71.  Pneumococcal polysaccharide (PPSV23) vaccine. One dose is recommended after age 71. Talk to your health care provider about which screenings and vaccines you need and how often you need them. This information is not intended to replace advice given to you by your health care provider. Make sure you discuss any questions you have with your health care provider. Document Released: 04/16/2015 Document Revised: 12/08/2015 Document Reviewed: 01/19/2015 Elsevier Interactive Patient Education  2017 [place] Prevention in the Home Falls can cause injuries. They can happen to people of all ages. There are many things you can do to make your home safe and to help prevent falls. What can I do on the outside of my home?  Regularly fix the edges of walkways and driveways and fix any cracks.  Remove anything that might make you trip as you walk through a door, such as a raised step or threshold.  Trim any bushes or trees on the path to your home.  Use bright outdoor lighting.  Clear any walking paths of anything that might make someone trip, such as rocks or tools.  Regularly check to see if handrails are loose or broken. Make sure that both sides of any steps have handrails.  Any raised decks and porches should have guardrails on the edges.  Have any leaves, snow, or ice cleared regularly.  Use sand or salt on walking paths during winter.  Clean up any spills in your garage right away. This includes oil or grease spills. What can I do in the bathroom?  Use night lights.  Install  grab bars by the toilet and in the tub and shower. Do not use towel bars as grab bars.  Use non-skid mats or decals in the tub or shower.  If you need to sit down in the shower, use a plastic, non-slip stool.  Keep the floor dry. Clean up any water that spills on the floor as soon as it happens.  Remove soap buildup in the tub or shower regularly.  Attach bath mats securely with double-sided non-slip rug tape.  Do not have throw rugs and other things on the floor that can make you trip. What can I do in the bedroom?  Use night lights.  Make sure that you have a light by your bed that is easy to reach.  Do not use any sheets or blankets that are too big for your bed. They should not hang down onto the floor.  Have a firm chair that has side arms. You can use this for support while you get dressed.  Do not have throw rugs and other things on the floor that can make you trip. What can I do in the kitchen?  Clean up any spills right away.  Avoid walking on wet floors.  Keep items that you use a lot in easy-to-reach places.  If  you need to reach something above you, use a strong step stool that has a grab bar.  Keep electrical cords out of the way.  Do not use floor polish or wax that makes floors slippery. If you must use wax, use non-skid floor wax.  Do not have throw rugs and other things on the floor that can make you trip. What can I do with my stairs?  Do not leave any items on the stairs.  Make sure that there are handrails on both sides of the stairs and use them. Fix handrails that are broken or loose. Make sure that handrails are as long as the stairways.  Check any carpeting to make sure that it is firmly attached to the stairs. Fix any carpet that is loose or worn.  Avoid having throw rugs at the top or bottom of the stairs. If you do have throw rugs, attach them to the floor with carpet tape.  Make sure that you have a light switch at the top of the stairs and  the bottom of the stairs. If you do not have them, ask someone to add them for you. What else can I do to help prevent falls?  Wear shoes that:  Do not have high heels.  Have rubber bottoms.  Are comfortable and fit you well.  Are closed at the toe. Do not wear sandals.  If you use a stepladder:  Make sure that it is fully opened. Do not climb a closed stepladder.  Make sure that both sides of the stepladder are locked into place.  Ask someone to hold it for you, if possible.  Clearly mark and make sure that you can see:  Any grab bars or handrails.  First and last steps.  Where the edge of each step is.  Use tools that help you move around (mobility aids) if they are needed. These include:  Canes.  Walkers.  Scooters.  Crutches.  Turn on the lights when you go into a dark area. Replace any light bulbs as soon as they burn out.  Set up your furniture so you have a clear path. Avoid moving your furniture around.  If any of your floors are uneven, fix them.  If there are any pets around you, be aware of where they are.  Review your medicines with your doctor. Some medicines can make you feel dizzy. This can increase your chance of falling. Ask your doctor what other things that you can do to help prevent falls. This information is not intended to replace advice given to you by your health care provider. Make sure you discuss any questions you have with your health care provider. Document Released: 01/14/2009 Document Revised: 08/26/2015 Document Reviewed: 04/24/2014 Elsevier Interactive Patient Education  2017 [employer].

## 2020-07-23 NOTE — Progress Notes (Signed)
Subjective:   Sheri Russell is a 75 y.o. female who presents for Medicare Annual (Subsequent) preventive examination.  Virtual Visit via Telephone Note  I connected with  Sheri Russell on 07/23/20 at  9:45 AM EDT by telephone and verified that I am speaking with the correct person using two identifiers.  Location: Patient: Home Provider: [facility] Persons participating in the virtual visit: patient/Nurse Health Advisor   I discussed the limitations, risks, security and privacy concerns of performing an evaluation and management service by telephone and the availability of in person appointments. The patient expressed understanding and agreed to proceed.  Interactive audio and video telecommunications were attempted between this nurse and patient, however failed, due to patient having technical difficulties OR patient did not have access to video capability.  We continued and completed visit with audio only.  Some vital signs may be absent or patient reported.   Tywanna Seifer E Nomi Rudnicki, LPN  Review of Systems     Cardiac Risk Factors include: advanced age (>37men, >60 women)     Objective:    Today's Vitals   07/23/20 0919  Weight: 138 lb (62.6 kg)  Height: 5\' 2"  (1.575 m)   Body mass index is 25.24 kg/m.  Advanced Directives 07/23/2019 01/14/2018 01/08/2017 03/13/2014  Does Patient Have a Medical Advance Directive? Yes Yes No Yes  Type of Paramedic of [place];Living will Living will;Healthcare Power of [place];Living will -  Does patient want to make changes to medical advance directive? No - Patient declined Yes (MAU/Ambulatory/Procedural Areas - Information given) - -  Copy of [place] in Chart? No - copy requested No - copy requested No - copy requested No - copy requested    Current Medications (verified) Outpatient Encounter Medications as of 07/23/2020  Medication Sig  . diclofenac Sodium (VOLTAREN) 1 % GEL  Apply 2 g topically 4 (four) times daily.  . enalapril (VASOTEC) 20 MG tablet Take 1 tablet (20 mg total) by mouth 2 (two) times daily.  . meclizine (ANTIVERT) 25 MG tablet TAKE 1 TABLET BY MOUTH THREE TIMES DAILY AS NEEDED FOR DIZZINESS  . Omega-3 Fatty Acids (FISH OIL) 1000 MG CPDR Take 1-2 capsules by mouth daily.  . ondansetron (ZOFRAN) 4 MG tablet TAKE 1 TABLET BY MOUTH EVERY 8 HOURS AS NEEDED FOR NAUSEA AND VOMITING  . oxybutynin (DITROPAN) 5 MG tablet Take 1 tablet by mouth at bedtime.  . VENTOLIN HFA 108 (90 Base) MCG/ACT inhaler INHALE 2 PUFFS BY MOUTH EVERY 6 HOURS AS NEEDED FOR WHEEZING OR SHORTNESS OF BREATH  . predniSONE (DELTASONE) 50 MG tablet Take 1 tablet 13 hours prior to scan, take 1 tablet 7 hours prior to scan and 1 tablet 1 hour prior to scan. (Patient not taking: Reported on 07/23/2020)  . [DISCONTINUED] ibuprofen (ADVIL) 600 MG tablet Take 1 tablet (600 mg total) by mouth every 8 (eight) hours as needed. (Patient not taking: Reported on 07/23/2020)   No facility-administered encounter medications on file as of 07/23/2020.    Allergies (verified) Fish allergy, Iohexol, Pneumovax [pneumococcal polysaccharide vaccine], Iodinated diagnostic agents, and Other   History: Past Medical History:  Diagnosis Date  . Allergic rhinitis   . Anxiety   . Asthma   . Bladder cancer ([place]) 1986  . Cancer [facility]) 1971   pelvic - oncologist -Dr. Paulo Fruit [facility] )  . Cervical cancer ([place])   . Colon polyps   . Hyperlipidemia   . Hypertension  Past Surgical History:  Procedure Laterality Date  . APPENDECTOMY  1986  . CHOLECYSTECTOMY  1985  . colostomy bag    . Several cancer surgeries  1971, [place]  . TOTAL ABDOMINAL HYSTERECTOMY  1986   Family History  Problem Relation Age of Onset  . Diabetes Mother   . Hypertension Mother   . Heart attack Mother   . Heart disease Mother   . Diabetes Father   . Hypertension Father   . Heart attack Father   . Heart  disease Father   . Diabetes Maternal Grandmother   . Hypertension Maternal Grandmother   . Heart attack Maternal Grandmother   . Diabetes Maternal Grandfather   . Hypertension Maternal Grandfather   . Heart attack Maternal Grandfather   . Diabetes Paternal Grandmother   . Hypertension Paternal Grandmother   . Heart attack Paternal Grandmother   . Diabetes Paternal Grandfather   . Hypertension Paternal Grandfather   . Heart attack Paternal Grandfather   . Stroke Brother   . Hypertension Brother   . Asthma Brother   . Liver cancer Brother   . Alcohol abuse Brother   . Cancer Sister        colon cancer   . Diabetes Sister   . Diabetes Sister   . Diabetes Sister   . Hypertension Brother   . Diabetes Brother    Social History   Socioeconomic History  . Marital status: Widowed    Spouse name: Not on file  . Number of children: 1  . Years of education: 8  . Highest education level: 8th grade  Occupational History  . 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 Smoking status: Former Smoker    Types: Cigarettes    Quit date: 1978    Years since quitting: 44.3  . Smokeless tobacco: Never Used  Vaping Use  . Vaping Use: Never used  Substance and Sexual Activity  . Alcohol use: Yes    Alcohol/week: 1.0 standard drink    Types: 1 Glasses of wine per week    Comment: occasionally   . Drug use: No  . Sexual activity: Not Currently  Other Topics Concern  . Not on file  Social History Narrative   Lives alone    Daughter in [place]   1 grandchild    Social Determinants of Health   Financial Resource Strain: Low Risk   . Difficulty of Paying Living Expenses: Not hard at all  Food Insecurity: No Food Insecurity  . Worried About Charity fundraiser in the Last Year: Never true  . Ran Out of Food in the Last Year: Never true  Transportation Needs: No Transportation Needs  . Lack of Transportation (Medical): No  . Lack of Transportation  (Non-Medical): No  Physical Activity: Insufficiently Active  . Days of Exercise per Week: 7 days  . Minutes of Exercise per Session: 20 min  Stress: No Stress Concern Present  . Feeling of Stress : Only a little  Social Connections: Socially Isolated  . Frequency of Communication with Friends and Family: More than three times a week  . Frequency of Social Gatherings with Friends and Family: More than three times a week  . Attends Religious Services: Never  . Active Member of Clubs or Organizations: No  . Attends Archivist Meetings: Never  . Marital Status: Widowed    Tobacco Counseling Counseling given: Not Answered   Clinical  Intake:  Pre-visit preparation completed: Yes  Pain : No/denies pain     BMI - recorded: 25.24 Nutritional Status: BMI 25 -29 Overweight Nutritional Risks: None Diabetes: No  How often do you need to have someone help you when you read instructions, pamphlets, or other written materials from your doctor or pharmacy?: 1 - Never  Diabetic? no  Interpreter Needed?: No  Information entered by :: Sheri Lenzo, LPN   Activities of Daily Living In your present state of health, do you have any difficulty performing the following activities: 07/23/2020  Hearing? N  Vision? N  Difficulty concentrating or making decisions? Y  Walking or climbing stairs? N  Dressing or bathing? N  Doing errands, shopping? N  Preparing Food and eating ? N  Using the Toilet? N  In the past six months, have you accidently leaked urine? N  Do you have problems with loss of bowel control? N  Comment she does have a colostomy bag  Managing your Medications? N  Managing your Finances? N  Housekeeping or managing your Housekeeping? N  Some recent data might be hidden    Patient Care Team: [name], FNP as PCP - General (Nurse Practitioner) Leticia Clas, [place] (Optometry) Shea Evans, Norva Riffle, LCSW as Social Worker (Licensed Clinical Social  Worker) Alwyn Pea, NP as Nurse Practitioner (Oncology) Christianne Borrow, MD (Obstetrics)  Indicate any recent Medical Services you may have received from other than [facility] providers in the past year (date may be approximate).     Assessment:   This is a routine wellness examination for Sheri Russell.  Hearing/Vision screen  Hearing Screening   125Hz  250Hz  500Hz  1000Hz  2000Hz  3000Hz  4000Hz  6000Hz  8000Hz   Right ear:           Left ear:           Comments: Declines hearing difficulties  Vision Screening Comments: Wears glasses - Annual visits with Dr Rosana Hoes in [place] - Up to date with eye exam  Dietary issues and exercise activities discussed: Current Exercise Habits: Home exercise routine, Time (Minutes): 20, Frequency (Times/Week): 7, Weekly Exercise (Minutes/Week): 140, Intensity: Mild, Exercise limited by: None identified  Goals    .  Client will talk with LCSW in next 30 days about medical needs of client and ADLs completion of client (pt-stated)      CARE PLAN ENTRY   Current Barriers:  . Patient with Chronic Diagnoses of HTN, HLD, Cancer of Vagina, Mild intermittent asthma with acute exacerbation  Clinical Social Work Clinical Goal(s):  Marland Kitchen LCSW will talk with client in next 30 days about medical needs of client and ADLs completion daily for client  Interventions:  . Talked with client about CCM program services . Talked with client about upcoming medical appointments . Talked with client about ambulation needs of client . [place] dwith client about transport needs of client . Talked with client about pain issues of client . Talked with client about vision needs of client . Talked with client about social support (daughter is supportive ) . Talked with client about [place] support with CCM program . Talked with client about ADLs completion daily . Talked with client about mood of client . Completed PHQ 2/9; completed GAD-7  Patient Self Care Activities:   Completes  ADLs independently Attends scheduled medical appointments Drives car to appointments and to complete errands   Patient Self Care Deficits:   [place]   Initial goal documentation     .  Exercise 3x per week (  30 min per time)      Encourage increase in walking from 2 to 3 sessions per week for 30 minutes each time.     .  Prevent falls      Depression Screen PHQ 2/9 Scores 07/23/2020 07/20/2020 01/19/2020 10/24/2019 08/11/2019 07/23/2019 07/18/2019  PHQ - 2 Score 0 0 0 2 0 0 0  PHQ- 9 Score - - - 3 - - -    Fall Risk Fall Risk  07/23/2020 07/20/2020 01/19/2020 08/11/2019 07/23/2019  Falls in the past year? 0 0 0 0 0  Comment - - - - -  Number falls in past yr: 0 - - - -  Injury with Fall? 0 - - - -  Comment - - - - -  Risk for fall due to : No Fall Risks - - - -  Risk for fall due to: Comment - - - - -  Follow up Falls prevention discussed - - - -    FALL RISK PREVENTION PERTAINING TO THE HOME:  Any stairs in or around the home? Yes  If so, are there any without handrails? No  Home free of loose throw rugs in walkways, pet beds, electrical cords, etc? Yes  Adequate lighting in your home to reduce risk of falls? Yes   ASSISTIVE DEVICES UTILIZED TO PREVENT FALLS:  Life alert? No  Use of a cane, walker or w/c? No  Grab bars in the bathroom? No  Shower chair or bench in shower? No  Elevated toilet seat or a handicapped toilet? No   TIMED UP AND GO:  Was the test performed? No . Telephonic visit.  Cognitive Function: MMSE - Mini Mental State Exam 01/14/2018 01/08/2017  Orientation to time 5 5  Orientation to Place 5 5  Registration 3 3  Attention/ Calculation 5 5  Recall 2 3  Language- name 2 objects 2 2  Language- repeat 1 1  Language- follow 3 step command 3 3  Language- read & follow direction 1 1  Write a sentence 1 1  Copy design 1 1  Total score 29 30     6CIT Screen 07/23/2020 07/23/2019  What Year? 0 points 0 points  What month? 0 points 0 points  What time?  0 points 0 points  Count back from 20 0 points 0 points  Months in reverse 0 points 0 points  Repeat phrase 2 points 0 points  Total Score 2 0    Immunizations Immunization History  Administered Date(s) Administered  . Fluad Quad(high Dose 65+) 01/19/2020  . Influenza, High Dose Seasonal PF 01/31/2017, 01/14/2018, 12/13/2018  . Influenza,inj,Quad PF,6+ Mos 01/20/2013, 03/20/2016  . Moderna SARS-COV2 Booster Vaccination 03/03/2020  . Moderna Sars-Covid-2 Vaccination 05/29/2019, 06/27/2019  . Pneumococcal Conjugate-13 07/29/2014  . Pneumococcal Polysaccharide-23 04/18/2010, 03/20/2016  . Tdap 05/26/2010    TDAP status: Due, Education has been provided regarding the importance of this vaccine. Advised may receive this vaccine at local pharmacy or Health Dept. Aware to provide a copy of the vaccination record if obtained from local pharmacy or Health Dept. Verbalized acceptance and understanding.  Flu Vaccine status: Up to date  Pneumococcal vaccine status: Up to date  Covid-19 vaccine status: Completed vaccines  Qualifies for Shingles Vaccine? Yes   Zostavax completed No   Shingrix Completed?: No.    Education has been provided regarding the importance of this vaccine. Patient has been advised to call insurance company to determine out of pocket expense if they have not yet  received this vaccine. Advised may also receive vaccine at local pharmacy or Health Dept. Verbalized acceptance and understanding.  Screening Tests Health Maintenance  Topic Date Due  . MAMMOGRAM  02/11/2020  . TETANUS/TDAP  07/20/2021 (Originally 05/26/2020)  . COVID-19 Vaccine (4 - Booster for Moderna series) 09/01/2020  . INFLUENZA VACCINE  11/01/2020  . DEXA SCAN  07/20/2021  . Hepatitis C Screening  Completed  . PNA vac Low Risk Adult  Completed  . HPV VACCINES  Aged Out    Health Maintenance  Health Maintenance Due  Topic Date Due  . MAMMOGRAM  02/11/2020    Colorectal cancer screening: No  longer required.   Mammogram status: Completed 02/11/2019. Repeat every year  Bone Density status: Completed 07/21/2019. Results reflect: Bone density results: OSTEOPENIA. Repeat every 2 years.  Lung Cancer Screening: (Low Dose CT Chest recommended if Age 26-80 years, 30 pack-year currently smoking OR have quit w/in 15years.) does not qualify.   Additional Screening:  Hepatitis C Screening: does qualify; Completed 02/05/2015  Vision Screening: Recommended annual ophthalmology exams for early detection of glaucoma and other disorders of the eye. Is the patient up to date with their annual eye exam?  Yes  Who is the provider or what is the name of the office in which the patient attends annual eye exams? [facility] If pt is not established with a provider, would they like to be referred to a provider to establish care? No .   Dental Screening: Recommended annual dental exams for proper oral hygiene  Community Resource Referral / Chronic Care Management: CRR required this visit?  No   CCM required this visit?  No      Plan:     I have personally reviewed and noted the following in the patient's chart:   . Medical and social history . Use of alcohol, tobacco or illicit drugs  . Current medications and supplements . Functional ability and status . Nutritional status . Physical activity . Advanced directives . List of other physicians . Hospitalizations, surgeries, and ER visits in previous 12 months . Vitals . Screenings to include cognitive, depression, and falls . Referrals and appointments  In addition, I have reviewed and discussed with patient certain preventive protocols, quality metrics, and best practice recommendations. A written personalized care plan for preventive services as well as general preventive health recommendations were provided to patient.     Sandrea Hammond, LPN   [id number]   Nurse Notes: None

## 2020-07-28 ENCOUNTER — Telehealth: Payer: Medicare Other

## 2020-08-05 DIAGNOSIS — H43393 Other vitreous opacities, bilateral: Secondary | ICD-10-CM | POA: Diagnosis not present

## 2020-08-27 ENCOUNTER — Other Ambulatory Visit: Payer: Self-pay | Admitting: Nurse Practitioner

## 2020-08-27 DIAGNOSIS — Z1231 Encounter for screening mammogram for malignant neoplasm of breast: Secondary | ICD-10-CM

## 2020-09-02 ENCOUNTER — Telehealth: Payer: Medicare Other

## 2020-10-18 ENCOUNTER — Telehealth: Payer: Medicare Other

## 2020-10-27 ENCOUNTER — Ambulatory Visit
Admission: RE | Admit: 2020-10-27 | Discharge: 2020-10-27 | Disposition: A | Discharge status: Home / Self Care | Payer: Medicare Other | Source: Ambulatory Visit | Attending: Nurse Practitioner | Admitting: Nurse Practitioner

## 2020-10-27 ENCOUNTER — Other Ambulatory Visit: Payer: Self-pay

## 2020-10-27 DIAGNOSIS — Z1231 Encounter for screening mammogram for malignant neoplasm of breast: Secondary | ICD-10-CM

## 2020-11-04 ENCOUNTER — Other Ambulatory Visit: Payer: Self-pay | Admitting: Nurse Practitioner

## 2020-11-05 ENCOUNTER — Other Ambulatory Visit: Payer: Self-pay | Admitting: Nurse Practitioner

## 2020-11-25 ENCOUNTER — Telehealth: Payer: Medicare Other

## 2020-11-29 ENCOUNTER — Telehealth: Payer: Medicare Other

## 2020-12-13 ENCOUNTER — Other Ambulatory Visit: Payer: Self-pay | Admitting: Family Medicine

## 2021-01-10 ENCOUNTER — Ambulatory Visit (INDEPENDENT_AMBULATORY_CARE_PROVIDER_SITE_OTHER): Payer: Medicare Other | Admitting: Licensed Clinical Social Worker

## 2021-01-10 DIAGNOSIS — J4521 Mild intermittent asthma with (acute) exacerbation: Secondary | ICD-10-CM

## 2021-01-10 DIAGNOSIS — I10 Essential (primary) hypertension: Secondary | ICD-10-CM

## 2021-01-10 DIAGNOSIS — E785 Hyperlipidemia, unspecified: Secondary | ICD-10-CM

## 2021-01-10 DIAGNOSIS — C52 Malignant neoplasm of vagina: Secondary | ICD-10-CM

## 2021-01-10 NOTE — Chronic Care Management (AMB) (Signed)
Chronic Care Management    Clinical Social Work Note  01/10/2021 Name: Sheri Russell MRN: [id number] DOB: 23-Jun-1945  Sheri Russell is a 75 y.o. year old female who is a primary care patient of [name], FNP. The CCM team was consulted to assist the patient with chronic disease management and/or care coordination needs related to: [name] .   Engaged with patient by telephone for follow up visit in response to provider referral for social work chronic care management and care coordination services.   Consent to Services:  The patient was given information about Chronic Care Management services, agreed to services, and gave verbal consent prior to initiation of services.  Please see initial visit note for detailed documentation.   Patient agreed to services and consent obtained.   Assessment: Review of patient past medical history, allergies, medications, and health status, including review of relevant consultants reports was performed today as part of a comprehensive evaluation and provision of chronic care management and care coordination services.     SDOH (Social Determinants of Health) assessments and interventions performed:  SDOH Interventions    Flowsheet Row Most Recent Value  SDOH Interventions   Stress Interventions Provide Counseling  [client has stress related to grief issues faced by client]  Depression Interventions/Treatment  --  [informed client of LCSW support and of RNCM support]        Advanced Directives Status: See [employer]yn[employer] application for related entries.  CCM Care Plan  Allergies  Allergen Reactions   Fish Allergy    Iohexol     IVP Dye    Pneumovax [Pneumococcal Polysaccharide Vaccine]     Patient said this is not true    Iodinated Diagnostic Agents Rash   Other Hives and Rash    Seafood perch, red snapper any seafood with iodine    Outpatient Encounter Medications as of 01/10/2021  Medication Sig   diclofenac Sodium (VOLTAREN)  1 % GEL Apply 2 g topically 4 (four) times daily.   enalapril (VASOTEC) 20 MG tablet Take 1 tablet (20 mg total) by mouth 2 (two) times daily.   meclizine (ANTIVERT) 25 MG tablet TAKE 1 TABLET BY MOUTH THREE TIMES DAILY AS NEEDED FOR DIZZINESS   Omega-3 Fatty Acids (FISH OIL) 1000 MG CPDR Take 1-2 capsules by mouth daily.   ondansetron (ZOFRAN) 4 MG tablet TAKE 1 TABLET BY MOUTH EVERY 8 HOURS AS NEEDED FOR NAUSEA AND VOMITING   oxybutynin (DITROPAN) 5 MG tablet Take 1 tablet by mouth at bedtime.   predniSONE (DELTASONE) 50 MG tablet Take 1 tablet 13 hours prior to scan, take 1 tablet 7 hours prior to scan and 1 tablet 1 hour prior to scan. (Patient not taking: Reported on 07/23/2020)   VENTOLIN HFA 108 (90 Base) MCG/ACT inhaler INHALE 2 PUFFS BY MOUTH EVERY 6 HOURS AS NEEDED FOR WHEEZING OR SHORTNESS OF BREATH   No facility-administered encounter medications on file as of 01/10/2021.    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 Manage Grief issues faced   Start Date: 01/10/2021  Expected End Date: 04/08/2021  This Visit's Progress: On track  Priority: Medium  Note:   Current Barriers:  Grief issues faced Seeking new employment Suicidal Ideation/Homicidal Ideation: No  Clinical Social Work Goal(s):  patient will work with SW monthly by telephone or in person to reduce or manage symptoms related to grief over death of her sister and over death of a friend Patient will call RNCM as needed in next 30 days for CCM nursing support Patient will utilize  self care activities in next 30 days to help manage stress and grief issues faced  Interventions: 1:1 collaboration with [name], FNP regarding development and update of comprehensive plan of care as evidenced by provider attestation and co-signature Discussed with Kalika the current grief issues faced by client. She spoke of death of her sister 11 months ago. She spoke of death of a close friend last month.   LCSW encouraged Sanaai to call [facility] [place] to discuss bereavement support program through that agency.  LCSW talked with client about chaplain support through that agency Reviewed with Sagal her family support. She has support from her daughter, Kizzie Ide. Client has other family members who call her regularly Gala Murdoch client about relaxation techniques of client. She said she likes caring for her pet dog as a way to relax. She enjoys going outdoors and walking her dog. Provided counseling support for client Encouraged client to call RNCM or LCSW as needed in next 30 days for CCM support  Patient Self Care Activities:  Self administers medications as prescribed Attends all scheduled provider appointments Performs ADL's independently Performs IADL's independently  Patient Coping Strengths:  Family Friends  Patient Self Care Deficits:  Grief issues are challenging for client  Patient Goals:  - spend time or talk with others at least 2 to 3 times per week - practice relaxation or meditation daily - keep a calendar with appointment dates  Follow Up Plan:  LCSW to call client on 02/07/21 at 9:15 AM to assess client needs.     Norva Riffle.Tyquavious Gamel MSW, LCSW Licensed Clinical Social Worker [facility] Care Management [phone]

## 2021-01-10 NOTE — Patient Instructions (Signed)
Visit Information  PATIENT GOALS:  Goals Addressed             This Visit's Progress    Manage My Emotions Manage Grief issues faced.       Timeframe:  Short-Term Goal Priority:  Medium Progress:  On Track Start Date:              01/10/21               Expected End Date:            04/08/21           Follow Up Date  02/07/21 at 9:15 AM   Manage Emotions; Manage Grief issues faced    Why is this important?   When you are stressed, down or upset, your body reacts too.  For example, your blood pressure may get higher; you may have a headache or stomachache.  When your emotions get the best of you, your body's ability to fight off cold and flu gets weak.  These steps will help you manage your emotions.     Patient Self Care Activities:  Self administers medications as prescribed Attends all scheduled provider appointments Performs ADL's independently Performs IADL's independently  Patient Coping Strengths:  Family Friends  Patient Self Care Deficits:  Grief issues are challenging for client  Patient Goals:  - spend time or talk with others at least 2 to 3 times per week - practice relaxation or meditation daily - keep a calendar with appointment dates  Follow Up Plan:  LCSW to call client on 02/07/21 at 9:15 AM to assess client needs    Norva Riffle.Precious Segall MSW, LCSW Licensed Clinical Social Worker [facility] Care Management [phone]

## 2021-01-11 ENCOUNTER — Telehealth: Payer: Medicare Other

## 2021-01-20 ENCOUNTER — Other Ambulatory Visit: Payer: Self-pay

## 2021-01-20 ENCOUNTER — Ambulatory Visit (INDEPENDENT_AMBULATORY_CARE_PROVIDER_SITE_OTHER): Payer: Medicare Other | Admitting: Nurse Practitioner

## 2021-01-20 ENCOUNTER — Encounter: Payer: Self-pay | Admitting: Nurse Practitioner

## 2021-01-20 VITALS — BP 126/73 | HR 70 | Temp 97.4°F | Resp 20 | Ht 62.0 in | Wt 137.0 lb

## 2021-01-20 DIAGNOSIS — E785 Hyperlipidemia, unspecified: Secondary | ICD-10-CM

## 2021-01-20 DIAGNOSIS — Z23 Encounter for immunization: Secondary | ICD-10-CM

## 2021-01-20 DIAGNOSIS — I10 Essential (primary) hypertension: Secondary | ICD-10-CM | POA: Diagnosis not present

## 2021-01-20 DIAGNOSIS — N3281 Overactive bladder: Secondary | ICD-10-CM | POA: Diagnosis not present

## 2021-01-20 MED ORDER — OXYBUTYNIN CHLORIDE 5 MG PO TABS: 5.0000 mg | ORAL_TABLET | Freq: Every day | ORAL | 1 refills | Status: DC

## 2021-01-20 MED ORDER — ENALAPRIL MALEATE 20 MG PO TABS: 20.0000 mg | ORAL_TABLET | Freq: Two times a day (BID) | ORAL | 1 refills | Status: DC

## 2021-01-20 NOTE — Progress Notes (Addendum)
Subjective:    Patient ID: Sheri Russell, female    DOB: 18-Jul-1945, 75 y.o.   MRN: [id number]   Chief Complaint: medical management of chronic issues     HPI:  1. Essential hypertension, benign No c/o chest pain, sob or headache. Does check blood pressure at home and is always in the 536'R systolic  BP Readings from Last 3 Encounters:  01/20/21 126/73  07/20/20 (!) 144/83  01/19/20 139/81      2. Hyperlipidemia with target LDL less than 100 Does try to watch diet and does no dedicated exercise. Lab Results  Component Value Date   CHOL 183 07/20/2020   HDL 72 07/20/2020   LDLCALC 90 07/20/2020   TRIG 123 07/20/2020   CHOLHDL 2.5 07/20/2020     3. Overactive bladder Is on detrol LA and that seems to help with urinary frequency.    Outpatient Encounter Medications as of 01/20/2021  Medication Sig   diclofenac Sodium (VOLTAREN) 1 % GEL Apply 2 g topically 4 (four) times daily.   enalapril (VASOTEC) 20 MG tablet Take 1 tablet (20 mg total) by mouth 2 (two) times daily.   meclizine (ANTIVERT) 25 MG tablet TAKE 1 TABLET BY MOUTH THREE TIMES DAILY AS NEEDED FOR DIZZINESS   Omega-3 Fatty Acids (FISH OIL) 1000 MG CPDR Take 1-2 capsules by mouth daily.   ondansetron (ZOFRAN) 4 MG tablet TAKE 1 TABLET BY MOUTH EVERY 8 HOURS AS NEEDED FOR NAUSEA AND VOMITING   oxybutynin (DITROPAN) 5 MG tablet Take 1 tablet by mouth at bedtime.   predniSONE (DELTASONE) 50 MG tablet Take 1 tablet 13 hours prior to scan, take 1 tablet 7 hours prior to scan and 1 tablet 1 hour prior to scan. (Patient not taking: Reported on 07/23/2020)   VENTOLIN HFA 108 (90 Base) MCG/ACT inhaler INHALE 2 PUFFS BY MOUTH EVERY 6 HOURS AS NEEDED FOR WHEEZING OR SHORTNESS OF BREATH   No facility-administered encounter medications on file as of 01/20/2021.    Past Surgical History:  Procedure Laterality Date   [place]   colostomy bag     Several cancer surgeries  1971, [place]    Family History  Problem Relation Age of Onset   Diabetes Mother    Hypertension Mother    Heart attack Mother    Heart disease Mother    Diabetes Father    Hypertension Father    Heart attack Father    Heart disease Father    Diabetes Maternal Grandmother    Hypertension Maternal Grandmother    Heart attack Maternal Grandmother    Diabetes Maternal Grandfather    Hypertension Maternal Grandfather    Heart attack Maternal Grandfather    Diabetes Paternal Grandmother    Hypertension Paternal Grandmother    Heart attack Paternal Grandmother    Diabetes Paternal Grandfather    Hypertension Paternal Grandfather    Heart attack Paternal Grandfather    Stroke Brother    Hypertension Brother    Asthma Brother    Liver cancer Brother    Alcohol abuse Brother    Cancer Sister        colon cancer    Diabetes Sister    Diabetes Sister    Diabetes Sister    Hypertension Brother    Diabetes Brother     New complaints: None today  Social history: Lives by herself  Controlled substance contract: n/a  Review of Systems     Objective:   Physical Exam Vitals and nursing note reviewed.  Constitutional:      General: She is not in acute distress.    Appearance: Normal appearance. She is well-developed.  HENT:     Head: Normocephalic.     Right Ear: Tympanic membrane normal.     Left Ear: Tympanic membrane normal.     Nose: Nose normal.     Mouth/Throat:     Mouth: Mucous membranes are moist.  Eyes:     Pupils: Pupils are equal, round, and reactive to light.  Neck:     Vascular: No carotid bruit or JVD.  Cardiovascular:     Rate and Rhythm: Normal rate and regular rhythm.     Heart sounds: Normal heart sounds.  Pulmonary:     Effort: Pulmonary effort is normal. No respiratory distress.     Breath sounds: Normal breath sounds. No wheezing or rales.  Chest:     Chest wall: No tenderness.  Abdominal:     General:  Bowel sounds are normal. There is no distension or abdominal bruit.     Palpations: Abdomen is soft. There is no hepatomegaly, splenomegaly, mass or pulsatile mass.     Tenderness: There is no abdominal tenderness.     Comments: Colonoscopy stump- pink and moist  Musculoskeletal:        General: Normal range of motion.     Cervical back: Normal range of motion and neck supple.  Lymphadenopathy:     Cervical: No cervical adenopathy.  Skin:    General: Skin is warm and dry.  Neurological:     Mental Status: She is alert and oriented to person, place, and time.     Deep Tendon Reflexes: Reflexes are normal and symmetric.  Psychiatric:        Behavior: Behavior normal.        Thought Content: Thought content normal.        Judgment: Judgment normal.    BP 126/73   Pulse 70   Temp (!) 97.4 F (36.3 C) (Temporal)   Resp 20   Ht _0  (1.575 m)   Wt 137 lb (62.1 kg)   LMP 07/09/1975   SpO2 99%   BMI 25.06 kg/m         Assessment & Plan:  Sheri Russell comes in today with chief complaint of Medical Management of Chronic Issues (Having cramps in legs and feet )   Diagnosis and orders addressed:  1. Essential hypertension, benign Low sodium diet - enalapril (VASOTEC) 20 MG tablet; Take 1 tablet (20 mg total) by mouth 2 (two) times daily.  Dispense: 180 tablet; Refill: 1  2. Hyperlipidemia with target LDL less than 100 Low fat diet  3. Overactive bladder  - oxybutynin (DITROPAN) 5 MG tablet; Take 1 tablet (5 mg total) by mouth at bedtime.  Dispense: 90 tablet; Refill: 1  Orders Placed This Encounter  Procedures   CBC with Differential/Platelet   CMP14+EGFR   Lipid panel     Labs pending Health Maintenance reviewed Diet and exercise encouraged  Follow up plan: 6 months   Mary-Margaret Hassell Done, FNP

## 2021-01-20 NOTE — Patient Instructions (Signed)

## 2021-01-20 NOTE — Addendum Note (Signed)
Addended by: [name] on: 01/20/2021 02:24 PM   Modules accepted: Orders

## 2021-01-21 LAB — CBC WITH DIFFERENTIAL/PLATELET
Basophils Absolute: 0.1 10*3/uL (ref 0.0–0.2)
Basos: 1 %
EOS (ABSOLUTE): 0.2 10*3/uL (ref 0.0–0.4)
Eos: 3 %
Hematocrit: 37.1 % (ref 34.0–46.6)
Hemoglobin: 12.2 g/dL (ref 11.1–15.9)
Immature Grans (Abs): 0 10*3/uL (ref 0.0–0.1)
Immature Granulocytes: 0 %
Lymphocytes Absolute: 2.4 10*3/uL (ref 0.7–3.1)
Lymphs: 34 %
MCH: 28.3 pg (ref 26.6–33.0)
MCHC: 32.9 g/dL (ref 31.5–35.7)
MCV: 86 fL (ref 79–97)
Monocytes Absolute: 0.4 10*3/uL (ref 0.1–0.9)
Monocytes: 6 %
Neutrophils Absolute: 3.9 10*3/uL (ref 1.4–7.0)
Neutrophils: 56 %
Platelets: 244 10*3/uL (ref 150–450)
RBC: 4.31 x10E6/uL (ref 3.77–5.28)
RDW: 13.7 % (ref 11.7–15.4)
WBC: 6.9 10*3/uL (ref 3.4–10.8)

## 2021-01-21 LAB — CMP14+EGFR
ALT: 21 IU/L (ref 0–32)
AST: 19 IU/L (ref 0–40)
Albumin/Globulin Ratio: 1.6 (ref 1.2–2.2)
Albumin: 4.6 g/dL (ref 3.7–4.7)
Alkaline Phosphatase: 84 IU/L (ref 44–121)
BUN/Creatinine Ratio: 12 (ref 12–28)
BUN: 16 mg/dL (ref 8–27)
Bilirubin Total: 0.3 mg/dL (ref 0.0–1.2)
CO2: 22 mmol/L (ref 20–29)
Calcium: 9.5 mg/dL (ref 8.7–10.3)
Chloride: 102 mmol/L (ref 96–106)
Creatinine, Ser: 1.35 mg/dL — ABNORMAL HIGH (ref 0.57–1.00)
Globulin, Total: 2.9 g/dL (ref 1.5–4.5)
Glucose: 111 mg/dL — ABNORMAL HIGH (ref 70–99)
Potassium: 4.4 mmol/L (ref 3.5–5.2)
Sodium: 137 mmol/L (ref 134–144)
Total Protein: 7.5 g/dL (ref 6.0–8.5)
eGFR: 41 mL/min/{1.73_m2} — ABNORMAL LOW (ref >59)

## 2021-01-21 LAB — LIPID PANEL
Chol/HDL Ratio: 2.9 ratio (ref 0.0–4.4)
Cholesterol, Total: 180 mg/dL (ref 100–199)
HDL: 62 mg/dL (ref >39)
LDL Chol Calc (NIH): 100 mg/dL — ABNORMAL HIGH (ref 0–99)
Triglycerides: 100 mg/dL (ref 0–149)
VLDL Cholesterol Cal: 18 mg/dL (ref 5–40)

## 2021-01-31 DIAGNOSIS — J4521 Mild intermittent asthma with (acute) exacerbation: Secondary | ICD-10-CM

## 2021-01-31 DIAGNOSIS — I10 Essential (primary) hypertension: Secondary | ICD-10-CM

## 2021-01-31 DIAGNOSIS — E785 Hyperlipidemia, unspecified: Secondary | ICD-10-CM | POA: Diagnosis not present

## 2021-02-07 ENCOUNTER — Ambulatory Visit (INDEPENDENT_AMBULATORY_CARE_PROVIDER_SITE_OTHER): Payer: Medicare Other | Admitting: Licensed Clinical Social Worker

## 2021-02-07 DIAGNOSIS — H401424 Capsular glaucoma with pseudoexfoliation of lens, left eye, indeterminate stage: Secondary | ICD-10-CM | POA: Diagnosis not present

## 2021-02-07 DIAGNOSIS — J4521 Mild intermittent asthma with (acute) exacerbation: Secondary | ICD-10-CM

## 2021-02-07 DIAGNOSIS — I10 Essential (primary) hypertension: Secondary | ICD-10-CM

## 2021-02-07 DIAGNOSIS — C52 Malignant neoplasm of vagina: Secondary | ICD-10-CM

## 2021-02-07 DIAGNOSIS — E785 Hyperlipidemia, unspecified: Secondary | ICD-10-CM

## 2021-02-07 NOTE — Patient Instructions (Addendum)
Visit Information  Patient Goal:  Manage emotions; Manage grief issues faced  Timeframe:  Short-Term Goal Priority:  Medium Progress:  On Track Start Date:            02/07/21                 Expected End Date:            05/06/21           Follow Up Date   04/06/21 at 2:00 PM  Manage Emotions; Manage Grief issues faced    Why is this important?   When you are stressed, down or upset, your body reacts too.  For example, your blood pressure may get higher; you may have a headache or stomachache.  When your emotions get the best of you, your body's ability to fight off cold and flu gets weak.  These steps will help you manage your emotions.     Patient Self Care Activities:  Self administers medications as prescribed Attends all scheduled provider appointments Performs ADL's independently Performs IADL's independently  Patient Coping Strengths:  Family Friends  Patient Self Care Deficits:  Grief issues are challenging for client  Patient Goals:  - spend time or talk with others at least 2 to 3 times per week - practice relaxation or meditation daily - keep a calendar with appointment dates  Follow Up Plan:  LCSW to call client on 04/06/21 at 2:00 PM to assess client needs  Norva Riffle.Wandalee Klang MSW, LCSW Licensed Clinical Social Worker [facility] [phone]

## 2021-02-07 NOTE — Chronic Care Management (AMB) (Signed)
Chronic Care Management    Clinical Social Work Note  02/07/2021 Name: Sheri Russell MRN: [id number] DOB: 03/23/1946  Sheri Russell is a 75 y.o. year old female who is a primary care patient of [name], FNP. The CCM team was consulted to assist the patient with chronic disease management and/or care coordination needs related to: [name] .   Engaged with patient by telephone for follow up visit in response to provider referral for social work chronic care management and care coordination services.   Consent to Services:  The patient was given information about Chronic Care Management services, agreed to services, and gave verbal consent prior to initiation of services.  Please see initial visit note for detailed documentation.   Patient agreed to services and consent obtained.   Assessment: Review of patient past medical history, allergies, medications, and health status, including review of relevant consultants reports was performed today as part of a comprehensive evaluation and provision of chronic care management and care coordination services.     SDOH (Social Determinants of Health) assessments and interventions performed: client has financial challenges in paying for her colostomy supplies. 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  SDOH Interventions    Flowsheet Row Most Recent Value  SDOH Interventions   Depression Interventions/Treatment  --  [informed client of LCSW support and of RNCM support]        Advanced Directives Status: See Vynca application for related entries.  CCM Care Plan  Allergies  Allergen Reactions   Fish Allergy    Iohexol     IVP Dye    Pneumovax [Pneumococcal Polysaccharide Vaccine]     Patient said this is not true    Iodinated Diagnostic Agents Rash   Other Hives and Rash    Seafood perch, red snapper any seafood with iodine    Outpatient Encounter Medications as of 02/07/2021  Medication Sig    diclofenac Sodium (VOLTAREN) 1 % GEL Apply 2 g topically 4 (four) times daily.   enalapril (VASOTEC) 20 MG tablet Take 1 tablet (20 mg total) by mouth 2 (two) times daily.   meclizine (ANTIVERT) 25 MG tablet TAKE 1 TABLET BY MOUTH THREE TIMES DAILY AS NEEDED FOR DIZZINESS   Omega-3 Fatty Acids (FISH OIL) 1000 MG CPDR Take 1-2 capsules by mouth daily.   ondansetron (ZOFRAN) 4 MG tablet TAKE 1 TABLET BY MOUTH EVERY 8 HOURS AS NEEDED FOR NAUSEA AND VOMITING   oxybutynin (DITROPAN) 5 MG tablet Take 1 tablet (5 mg total) by mouth at bedtime.   VENTOLIN HFA 108 (90 Base) MCG/ACT inhaler INHALE 2 PUFFS BY MOUTH EVERY 6 HOURS AS NEEDED FOR WHEEZING OR SHORTNESS OF BREATH   No facility-administered encounter medications on file as of 02/07/2021.    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  Care Plan : LCSW Care Plan  Updates made by Sheri Cabal, LCSW since 02/07/2021 12:00 AM     Problem: Emotional Distress      Goal: Emotional Health Supported. Manage Grief issues faced   Start Date: 02/07/2021  Expected End Date: 05/06/2021  This Visit's Progress: On track  Recent Progress: On track  Priority: Medium  Note:   Current  Barriers:  Grief issues faced Seeking new employment Suicidal Ideation/Homicidal Ideation: No  Clinical Social Work Goal(s):  patient will work with SW monthly by telephone or in person to reduce or manage symptoms related to grief over death of her sister and over death of a friend Patient will call RNCM as needed in next 30 days for CCM nursing support Patient will utilize self care activities in next 30 days to help manage stress and grief issues  faced  Interventions: 1:1 collaboration with [name], [place] regarding development and update of comprehensive plan of care as evidenced by provider attestation and co-signature Discussed with Sheri Russell the current grief issues faced by client. She spoke of death of her sister 11 months ago. She spoke of death of a close friend last month.  LCSW talked with Sheri Russell about her managing grief issues experienced. She said she enjoys walking outdoors with her pets. She enjoys talking via phone with friends. She said she is adjusting to grief issues experienced Reviewed previously with Sheri Russell her family support. She has support from her daughter, Sheri Russell. Client has other family members who call her regularly Provided counseling support for client Encouraged client to call RNCM or LCSW as needed in next 30 days for CCM support Discussed medication procurement with client Discussed colostomy care with client. She said she is doing well with colostomy care. She said sometimes buying her colostomy supplies can be expensive Reviewed client employment status. 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  Patient Self Care Activities:  Self administers medications as prescribed Attends all scheduled provider appointments Performs ADL's independently Performs IADL's independently  Patient Coping Strengths:  Family Friends  Patient Self Care Deficits:  Grief issues are challenging for client  Patient Goals:  - spend time or talk with others at least 2 to 3 times per week - practice relaxation or meditation daily - keep a calendar with appointment dates  Follow Up Plan:  LCSW to call client on 04/06/21 at 2:00 PM to assess client needs     Norva Riffle.Sheri Russell MSW, LCSW Licensed Clinical Social Worker [facility] Care Management [phone]

## 2021-03-02 DIAGNOSIS — L820 Inflamed seborrheic keratosis: Secondary | ICD-10-CM | POA: Diagnosis not present

## 2021-03-02 DIAGNOSIS — D2339 Other benign neoplasm of skin of other parts of face: Secondary | ICD-10-CM | POA: Diagnosis not present

## 2021-03-02 DIAGNOSIS — D485 Neoplasm of uncertain behavior of skin: Secondary | ICD-10-CM | POA: Diagnosis not present

## 2021-03-02 DIAGNOSIS — D2239 Melanocytic nevi of other parts of face: Secondary | ICD-10-CM | POA: Diagnosis not present

## 2021-04-06 ENCOUNTER — Ambulatory Visit (INDEPENDENT_AMBULATORY_CARE_PROVIDER_SITE_OTHER): Payer: Medicare Other | Admitting: Licensed Clinical Social Worker

## 2021-04-06 DIAGNOSIS — E785 Hyperlipidemia, unspecified: Secondary | ICD-10-CM

## 2021-04-06 DIAGNOSIS — I10 Essential (primary) hypertension: Secondary | ICD-10-CM

## 2021-04-06 DIAGNOSIS — J4521 Mild intermittent asthma with (acute) exacerbation: Secondary | ICD-10-CM

## 2021-04-06 DIAGNOSIS — C52 Malignant neoplasm of vagina: Secondary | ICD-10-CM

## 2021-04-06 NOTE — Patient Instructions (Addendum)
Visit Information  Patient Goals: Manage My Emotions. Manage Grief issues faced. Manage depression issues  Timeframe:  Short-Term Goal Priority:  Medium Progress:  On Track Start Date:        04/06/21                   Expected End Date:        07/04/21          Follow Up Date   05/31/21 at 3:00 PM  Manage My Emotions; Manage Grief issues faced. Manage depression issues    Why is this important?   When you are stressed, down or upset, your body reacts too.  For example, your blood pressure may get higher; you may have a headache or stomachache.  When your emotions get the best of you, your body's ability to fight off cold and flu gets weak.  These steps will help you manage your emotions.     Patient Self Care Activities:  Self administers medications as prescribed Attends all scheduled provider appointments Performs ADL's independently Performs IADL's independently  Patient Coping Strengths:  Family Friends  Patient Self Care Deficits:  Grief issues are challenging for client  Patient Goals:  - spend time or talk with others at least 2 to 3 times per week - practice relaxation or meditation daily - keep a calendar with appointment dates  Follow Up Plan:  LCSW to call client on 05/31/21 at 3:00 PM to assess client needs  Norva Riffle.Shaiden Aldous MSW, LCSW Licensed Clinical Social Worker [facility] Care Management [phone]

## 2021-04-06 NOTE — Chronic Care Management (AMB) (Signed)
Chronic Care Management    Clinical Social Work Note  04/06/2021 Name: Sheri Russell MRN: [id number] DOB: Feb 06, 1946  Sheri Russell is a 76 y.o. year old female who is a primary care patient of Sheri Pretty, FNP. The CCM team was consulted to assist the patient with chronic disease management and/or care coordination needs related to: [name] .   Engaged with patient by telephone for follow up visit in response to provider referral for social work chronic care management and care coordination services.   Consent to Services:  The patient was given information about Chronic Care Management services, agreed to services, and gave verbal consent prior to initiation of services.  Please see initial visit note for detailed documentation.   Patient agreed to services and consent obtained.   Assessment: Review of patient past medical history, allergies, medications, and health status, including review of relevant consultants reports was performed today as part of a comprehensive evaluation and provision of chronic care management and care coordination services.     SDOH (Social Determinants of Health) assessments and interventions performed: Client has potential for social isolation.   SDOH Interventions    Flowsheet Row Most Recent Value  SDOH Interventions   Stress Interventions Provide Counseling  [client has stress related to managing medical needs. client has some financial stress issues]  Depression Interventions/Treatment  --  [informed client of LCSW support and of RNCM support]        Advanced Directives Status: See Vynca application for related entries.  CCM Care Plan  Allergies  Allergen Reactions   Fish Allergy    Iohexol     IVP Dye    Pneumovax [Pneumococcal Polysaccharide Vaccine]     Patient said this is not true    Iodinated Contrast Media Rash   Other Hives and Rash    Seafood perch, red snapper any seafood with iodine    Outpatient Encounter  Medications as of 04/06/2021  Medication Sig   diclofenac Sodium (VOLTAREN) 1 % GEL Apply 2 g topically 4 (four) times daily.   enalapril (VASOTEC) 20 MG tablet Take 1 tablet (20 mg total) by mouth 2 (two) times daily.   meclizine (ANTIVERT) 25 MG tablet TAKE 1 TABLET BY MOUTH THREE TIMES DAILY AS NEEDED FOR DIZZINESS   Omega-3 Fatty Acids (FISH OIL) 1000 MG CPDR Take 1-2 capsules by mouth daily.   ondansetron (ZOFRAN) 4 MG tablet TAKE 1 TABLET BY MOUTH EVERY 8 HOURS AS NEEDED FOR NAUSEA AND VOMITING   oxybutynin (DITROPAN) 5 MG tablet Take 1 tablet (5 mg total) by mouth at bedtime.   VENTOLIN HFA 108 (90 Base) MCG/ACT inhaler INHALE 2 PUFFS BY MOUTH EVERY 6 HOURS AS NEEDED FOR WHEEZING OR SHORTNESS OF BREATH   No facility-administered encounter medications on file as of 04/06/2021.    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 Monitor client management of depression issues  Care Plan : LCSW Care Plan  Updates made by Sheri Cabal, LCSW since 04/06/2021 12:00 AM     Problem: Emotional Distress      Goal: Emotional Health Supported. Manage Grief issues faced. Manage Depression issues   Start Date: 04/06/2021  Expected End Date: 07/04/2021  This Visit's Progress: On  track  Recent Progress: On track  Priority: Medium  Note:   Current Barriers:  Grief issues faced Depression issues faced Seeking new employment Suicidal Ideation/Homicidal Ideation: No  Clinical Social Work Goal(s):  patient will work with SW monthly by telephone or in person to reduce or manage symptoms related to grief over death of her sister and over death of a friend Patient will communicate with SW monthly by telephone  or in person to reduce or manage symptoms related to depression issues of client Patient will call RNCM as needed in next 30 days for CCM nursing support Patient will utilize self care activities in next 30 days to help manage stress and grief issues faced  Interventions: 1:1 collaboration with Sheri Pretty, FNP regarding development and update of comprehensive plan of care as evidenced by provider attestation and co-signature Discussed with Sheri Russell the current grief issues faced by client. She spoke of death of her sister 11 months ago. She spoke of death of a close friend last month.  LCSW talked with Sheri Russell about her managing grief issues experienced. She said she enjoys walking outdoors with her pets. She enjoys talking via phone with friends. She said she is adjusting to grief issues experienced.  LCSW encouraged Sheri Russell to call [facility] [place] to discuss Grief Share program through [facility] [place] Provided counseling support for client Reviewed with Sheri Russell her family support. She has support from her daughter, Sheri Russell. Client has other family members who call her regularly Encouraged client to call RNCM as needed in next 30 days for CCM nursing support Discussed medication procurement with client Discussed colostomy care with client. She said she is doing well with colostomy care. She said sometimes buying her colostomy supplies can be expensive Reviewed client employment status. 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  Patient Self Care Activities:  Self administers medications as prescribed Attends all scheduled provider appointments Performs ADL's independently Performs IADL's independently  Patient Coping Strengths:  Family Friends  Patient Self Care Deficits:  Grief issues are challenging for client  Patient Goals:  - spend time or talk with  others at least 2 to 3 times per week - practice relaxation or meditation daily - keep a calendar with appointment dates  Follow Up Plan:  LCSW to call client on 05/31/21 at 3:00 PM to assess client needs    Sheri Russell.Sheri Russell MSW, LCSW Licensed Clinical Social Worker [facility] Care Management [phone]

## 2021-04-15 DIAGNOSIS — U071 COVID-19: Secondary | ICD-10-CM | POA: Diagnosis not present

## 2021-04-15 DIAGNOSIS — J329 Chronic sinusitis, unspecified: Secondary | ICD-10-CM | POA: Diagnosis not present

## 2021-04-15 DIAGNOSIS — R0981 Nasal congestion: Secondary | ICD-10-CM | POA: Diagnosis not present

## 2021-04-18 DIAGNOSIS — U071 COVID-19: Secondary | ICD-10-CM | POA: Diagnosis not present

## 2021-05-31 ENCOUNTER — Telehealth: Payer: Medicare Other

## 2021-06-09 ENCOUNTER — Telehealth: Payer: Self-pay | Admitting: Licensed Clinical Social Worker

## 2021-06-09 ENCOUNTER — Telehealth: Payer: Medicare Other

## 2021-06-09 NOTE — Telephone Encounter (Signed)
?  Chronic Care Management  ?  ? Clinical Social Work Note ?  ?06/09/21 ?Name: Sheri Russell MRN: [id number]       DOB: Apr 08, 1945 ?  ?Sheri Russell is a 76 y.o. year old female who is a primary care patient of [name], FNP. The CCM team was consulted to assist the patient with chronic disease management and/or care coordination needs related to: [name] .  ?  ?LCSW not able to speak via phone with client today.  LCSW not able to leave phone message for client ? ?Follow Up Plan: Call client on 08/02/21 at 2:00 PM ? ?Norva Riffle.Eoghan Belcher MSW, LCSW ?Licensed Clinical Social Worker ?[facility] ?[phone] ?

## 2021-07-25 ENCOUNTER — Ambulatory Visit (INDEPENDENT_AMBULATORY_CARE_PROVIDER_SITE_OTHER): Payer: Medicare Other

## 2021-07-25 ENCOUNTER — Encounter: Payer: Self-pay | Admitting: Nurse Practitioner

## 2021-07-25 ENCOUNTER — Ambulatory Visit (INDEPENDENT_AMBULATORY_CARE_PROVIDER_SITE_OTHER): Payer: Medicare Other | Admitting: Nurse Practitioner

## 2021-07-25 VITALS — BP 111/69 | HR 70 | Temp 97.7°F | Resp 20 | Ht 62.0 in | Wt 136.0 lb

## 2021-07-25 DIAGNOSIS — Z780 Asymptomatic menopausal state: Secondary | ICD-10-CM | POA: Diagnosis not present

## 2021-07-25 DIAGNOSIS — M8589 Other specified disorders of bone density and structure, multiple sites: Secondary | ICD-10-CM | POA: Diagnosis not present

## 2021-07-25 DIAGNOSIS — N3281 Overactive bladder: Secondary | ICD-10-CM

## 2021-07-25 DIAGNOSIS — M8588 Other specified disorders of bone density and structure, other site: Secondary | ICD-10-CM

## 2021-07-25 DIAGNOSIS — E785 Hyperlipidemia, unspecified: Secondary | ICD-10-CM | POA: Diagnosis not present

## 2021-07-25 DIAGNOSIS — I10 Essential (primary) hypertension: Secondary | ICD-10-CM

## 2021-07-25 DIAGNOSIS — L719 Rosacea, unspecified: Secondary | ICD-10-CM | POA: Diagnosis not present

## 2021-07-25 MED ORDER — METRONIDAZOLE 1 % EX GEL
Freq: Every day | CUTANEOUS | 1 refills | Status: DC
Start: 2021-07-25 — End: 2022-08-01

## 2021-07-25 MED ORDER — OXYBUTYNIN CHLORIDE 5 MG PO TABS: 5.0000 mg | ORAL_TABLET | Freq: Every day | ORAL | 1 refills | Status: DC

## 2021-07-25 MED ORDER — ENALAPRIL MALEATE 20 MG PO TABS: 20.0000 mg | ORAL_TABLET | Freq: Two times a day (BID) | ORAL | 1 refills | Status: DC

## 2021-07-25 NOTE — Addendum Note (Signed)
Addended by: [name] on: 07/25/2021 02:32 PM ? ? Modules accepted: Orders ? ?

## 2021-07-25 NOTE — Patient Instructions (Signed)
Bone Health Bones protect organs, store calcium, anchor muscles, and support the whole body. Keeping your bones strong is important, especially as you get older. You can take actions to help keep your bones strong and healthy. Why is keeping my bones healthy important?  Keeping your bones healthy is important because your body constantly replaces bone cells. Cells get old, and new cells take their place. As we age, we lose bone cells because the body may not be able to make enough new cells to replace the old cells. The amount of bone cells and bone tissue you have is referred to as bone mass. The higher your bone mass, the stronger your bones. The aging process leads to an overall loss of bone mass in the body, which can increase the likelihood of: Broken bones. A condition in which the bones become weak and brittle (osteoporosis). A large decline in bone mass occurs in older adults. In women, it occurs about the time of menopause. What actions can I take to keep my bones healthy? Good health habits are important for maintaining healthy bones. This includes eating nutritious foods and exercising regularly. To have healthy bones, you need to get enough of the right minerals and vitamins. Most nutrition experts recommend getting these nutrients from the foods that you eat. In some cases, taking supplements may also be recommended. Doing certain types of exercise is also important for bone health. What are the nutritional recommendations for healthy bones?  Eating a well-balanced diet with plenty of calcium and vitamin D will help to protect your bones. Nutritional recommendations vary from person to person. Ask your health care provider what is healthy for you. Here are some general guidelines. Get enough calcium Calcium is the most important (essential) mineral for bone health. Most people can get enough calcium from their diet, but supplements may be recommended for people who are at risk for  osteoporosis. Good sources of calcium include: Dairy products, such as low-fat or nonfat milk, cheese, and yogurt. Dark green leafy vegetables, such as bok choy and broccoli. Foods that have calcium added to them (are fortified). Foods that may be fortified with calcium include orange juice, cereal, bread, soy beverages, and tofu products. Nuts, such as almonds. Follow these recommended amounts for daily calcium intake: Infants, 0-6 months: 200 mg. Infants, 6-12 months: 260 mg. Children, age 1-3: 700 mg. Children, age 4-8: 1,000 mg. Children, age 9-13: 1,300 mg. Teens, age 14-18: 1,300 mg. Adults, age 19-50: 1,000 mg. Adults, age 51-70: Men: 1,000 mg. Women: 1,200 mg. Adults, age 71 or older: 1,200 mg. Pregnant and breastfeeding females: Teens: 1,300 mg. Adults: 1,000 mg. Get enough vitamin D Vitamin D is the most essential vitamin for bone health. It helps the body absorb calcium. Sunlight stimulates the skin to make vitamin D, so be sure to get enough sunlight. If you live in a cold climate or you do not get outside often, your health care provider may recommend that you take vitamin D supplements. Good sources of vitamin D in your diet include: Egg yolks. Saltwater fish. Milk and cereal fortified with vitamin D. Follow these recommended amounts for daily vitamin D intake: Infants, 0-12 months: 400 international units (IU). Children and teens, age 1-18: 600 international units. Adults, age 59 or younger: 600 international units. Adults, age 60 or older: 600-1,000 international units. Get other important nutrients Other nutrients that are important for bone health include: Phosphorus. This mineral is found in meat, poultry, dairy foods, nuts, and legumes. The   recommended daily intake for adult men and adult women is 700 mg. Magnesium. This mineral is found in seeds, nuts, dark green vegetables, and legumes. The recommended daily intake for adult men is 400-420 mg. For adult women,  it is 310-320 mg. Vitamin K. This vitamin is found in green leafy vegetables. The recommended daily intake is 120 mcg for adult men and 90 mcg for adult women. What type of physical activity is best for building and maintaining healthy bones? Weight-bearing and strength-building activities are important for building and maintaining healthy bones. Weight-bearing activities cause muscles and bones to work against gravity. Strength-building activities increase the strength of the muscles that support bones. Weight-bearing and muscle-building activities include: Walking and hiking. Jogging and running. Dancing. Gym exercises. Lifting weights. Tennis and racquetball. Climbing stairs. Aerobics. Adults should get at least 30 minutes of moderate physical activity on most days. Children should get at least 60 minutes of moderate physical activity on most days. Ask your health care provider what type of exercise is best for you. How can I find out if my bone mass is low? Bone mass can be measured with an X-ray test called a bone mineral density (BMD) test. This test is recommended for all women who are age 65 or older. It may also be recommended for: Men who are age 70 or older. People who are at risk for osteoporosis because of: Having a long-term disease that weakens bones, such as kidney disease or rheumatoid arthritis. Having menopause earlier than normal. Taking medicine that weakens bones, such as steroids, thyroid hormones, or hormone treatment for breast cancer or prostate cancer. Smoking. Drinking three or more alcoholic drinks a day. Being underweight. Sedentary lifestyle. If you find that you have a low bone mass, you may be able to prevent osteoporosis or further bone loss by changing your diet and lifestyle. Where can I find more information? Bone Health & Osteoporosis Foundation: www.nof.org/patients National Institutes of Health: www.bones.nih.gov International Osteoporosis  Foundation: www.iofbonehealth.org Summary The aging process leads to an overall loss of bone mass in the body, which can increase the likelihood of broken bones and osteoporosis. Eating a well-balanced diet with plenty of calcium and vitamin D will help to protect your bones. Weight-bearing and strength-building activities are also important for building and maintaining strong bones. Bone mass can be measured with an X-ray test called a bone mineral density (BMD) test. This information is not intended to replace advice given to you by your health care provider. Make sure you discuss any questions you have with your health care provider. Document Revised: 09/01/2020 Document Reviewed: 09/01/2020 Elsevier Patient Education  2023 Elsevier Inc.  

## 2021-07-25 NOTE — Progress Notes (Signed)
? ?Subjective:  ? ? Patient ID: Sheri Russell, female    DOB: 12-Apr-1945, 76 y.o.   MRN: [id number] ? ? ?Chief Complaint: Medical Management of Chronic Issues ( sinus pressure) ?  ? ?HPI: ? ?Sheri Russell is a 76 y.o. who identifies as a female who was assigned female at birth.  ? ?Social history: ?Lives with: by herself ?Work history: retired ? ? ?Comes in today for follow up of the following chronic medical issues: ? ?1. Essential hypertension, benign ?No c/o chest pain, sob or headache. Does not check blood pressure at home. ?. ?BP Readings from Last 3 Encounters:  ?07/25/21 111/69  ?01/20/21 126/73  ?07/20/20 (!) 144/83  ? ? ? ?2. Hyperlipidemia with target LDL less than 100 ?Does try to wathc diet. She has been walkin g to the park daily ?Lab Results  ?Component Value Date  ? CHOL 180 01/20/2021  ? HDL 62 01/20/2021  ? [place] 100 (H) 01/20/2021  ? TRIG 100 01/20/2021  ? CHOLHDL 2.9 01/20/2021  ? ? ? ?3. Overactive bladder ?Is on ditropan and is doing well. ? ? ?New complaints: ?Has red rash on bil cheeks. Her daughter has rosecea. She has tried OTC creams with  relief. ? ?Allergies  ?Allergen Reactions  ? Fish Allergy   ? Iohexol   ?  IVP Dye   ? Pneumovax [Pneumococcal Polysaccharide Vaccine]   ?  Patient said this is not true   ? Iodinated Contrast Media Rash  ? Other Hives and Rash  ?  Seafood perch, red snapper any seafood with iodine  ? ?Outpatient Encounter Medications as of 07/25/2021  ?Medication Sig  ? enalapril (VASOTEC) 20 MG tablet Take 1 tablet (20 mg total) by mouth 2 (two) times daily.  ? meclizine (ANTIVERT) 25 MG tablet TAKE 1 TABLET BY MOUTH THREE TIMES DAILY AS NEEDED FOR DIZZINESS  ? Omega-3 Fatty Acids (FISH OIL) 1000 MG CPDR Take 1-2 capsules by mouth daily.  ? ondansetron (ZOFRAN) 4 MG tablet TAKE 1 TABLET BY MOUTH EVERY 8 HOURS AS NEEDED FOR NAUSEA AND VOMITING  ? oxybutynin (DITROPAN) 5 MG tablet Take 1 tablet (5 mg total) by mouth at bedtime.  ? VENTOLIN HFA 108 (90 Base) MCG/ACT inhaler  INHALE 2 PUFFS BY MOUTH EVERY 6 HOURS AS NEEDED FOR WHEEZING OR SHORTNESS OF BREATH  ? [DISCONTINUED] diclofenac Sodium (VOLTAREN) 1 % GEL Apply 2 g topically 4 (four) times daily.  ? ?No facility-administered encounter medications on file as of 07/25/2021.  ? ? ?Past Surgical History:  ?Procedure Laterality Date  ? APPENDECTOMY  1986  ? CHOLECYSTECTOMY  1985  ? colostomy bag    ? Several cancer surgeries  1971, 1985, & 1986  ? TOTAL ABDOMINAL HYSTERECTOMY  1986  ? ? ?Family History  ?Problem Relation Age of Onset  ? Diabetes Mother   ? Hypertension Mother   ? Heart attack Mother   ? Heart disease Mother   ? Diabetes Father   ? Hypertension Father   ? Heart attack Father   ? Heart disease Father   ? Diabetes Maternal Grandmother   ? Hypertension Maternal Grandmother   ? Heart attack Maternal Grandmother   ? Diabetes Maternal Grandfather   ? Hypertension Maternal Grandfather   ? Heart attack Maternal Grandfather   ? Diabetes Paternal Grandmother   ? Hypertension Paternal Grandmother   ? Heart attack Paternal Grandmother   ? Diabetes Paternal Grandfather   ? Hypertension Paternal Grandfather   ? Heart attack Paternal Grandfather   ?  Stroke Brother   ? Hypertension Brother   ? Asthma Brother   ? Liver cancer Brother   ? Alcohol abuse Brother   ? Cancer Sister   ?     colon cancer   ? Diabetes Sister   ? Diabetes Sister   ? Diabetes Sister   ? Hypertension Brother   ? Diabetes Brother   ? ? ? ? ?Controlled substance contract: n/a ? ? ? ? ?Review of Systems  ?Constitutional:  Negative for diaphoresis.  ?Eyes:  Negative for pain.  ?Respiratory:  Negative for shortness of breath.   ?Cardiovascular:  Negative for chest pain, palpitations and leg swelling.  ?Gastrointestinal:  Negative for abdominal pain.  ?Endocrine: Negative for polydipsia.  ?Skin:  Negative for rash.  ?Neurological:  Negative for dizziness, weakness and headaches.  ?Hematological:  Does not bruise/bleed easily.  ?All other systems reviewed and are  negative. ? ?   ?Objective:  ? Physical Exam ?Vitals and nursing note reviewed.  ?Constitutional:   ?   General: She is not in acute distress. ?   Appearance: Normal appearance. She is well-developed.  ?HENT:  ?   Head: Normocephalic.  ?   Right Ear: Tympanic membrane normal.  ?   Left Ear: Tympanic membrane normal.  ?   Nose: Nose normal.  ?   Mouth/Throat:  ?   Mouth: Mucous membranes are moist.  ?Eyes:  ?   Pupils: Pupils are equal, round, and reactive to light.  ?Neck:  ?   Vascular: No carotid bruit or JVD.  ?Cardiovascular:  ?   Rate and Rhythm: Normal rate and regular rhythm.  ?   Heart sounds: Normal heart sounds.  ?Pulmonary:  ?   Effort: Pulmonary effort is normal. No respiratory distress.  ?   Breath sounds: Normal breath sounds. No wheezing or rales.  ?Chest:  ?   Chest wall: No tenderness.  ?Abdominal:  ?   General: Bowel sounds are normal. There is no distension or abdominal bruit.  ?   Palpations: Abdomen is soft. There is no hepatomegaly, splenomegaly, mass or pulsatile mass.  ?   Tenderness: There is no abdominal tenderness.  ?Musculoskeletal:     ?   General: Normal range of motion.  ?   Cervical back: Normal range of motion and neck supple.  ?Lymphadenopathy:  ?   Cervical: No cervical adenopathy.  ?Skin: ?   General: Skin is warm and dry.  ?   Comments: Bil cheeks re red and have small macular lesions.  ?Neurological:  ?   Mental Status: She is alert and oriented to person, place, and time.  ?   Deep Tendon Reflexes: Reflexes are normal and symmetric.  ?Psychiatric:     ?   Behavior: Behavior normal.     ?   Thought Content: Thought content normal.     ?   Judgment: Judgment normal.  ? ?BP 111/69   Pulse 70   Temp 97.7 ?F (36.5 ?C) (Temporal)   Resp 20   Ht '5\' 2"'$  (1.575 m)   Wt 136 lb (61.7 kg)   LMP 07/09/1975   SpO2 99%   BMI 24.87 kg/m?  ? ? ? ? ?   ?Assessment & Plan:  ?Sheri Russell comes in today with chief complaint of Medical Management of Chronic Issues ( sinus  pressure) ? ? ?Diagnosis and orders addressed: ? ?1. Essential hypertension, benign ?Low sodium diet ?- enalapril (VASOTEC) 20 MG tablet; Take 1 tablet (20 mg total)  by mouth 2 (two) times daily.  Dispense: 180 tablet; Refill: 1 ? ?2. Hyperlipidemia with target LDL less than 100 ?Low fat diet ? ?3. Overactive bladder ?- oxybutynin (DITROPAN) 5 MG tablet; Take 1 tablet (5 mg total) by mouth at bedtime.  Dispense: 90 tablet; Refill: 1 ? ?4. Rosacea ?Avoid hat water on face ?- metroNIDAZOLE (METROGEL) 1 % gel; Apply topically daily.  Dispense: 45 g; Refill: 1 ? ?Orders Placed This Encounter  ?Procedures  ? DG [facility] DEXA  ?  Order Specific Question:   Reason for Exam (SYMPTOM  OR DIAGNOSIS REQUIRED)  ?  Answer:   screening  ? ? ?Labs pending ?Health Maintenance reviewed ?Diet and exercise encouraged ? ?Follow up plan: ?6 months ? ? ?Mary-Margaret Hassell Done, FNP ? ? ?

## 2021-07-26 ENCOUNTER — Ambulatory Visit: Payer: Medicare Other

## 2021-07-26 LAB — LIPID PANEL
Chol/HDL Ratio: 2.9 ratio (ref 0.0–4.4)
Cholesterol, Total: 184 mg/dL (ref 100–199)
HDL: 64 mg/dL (ref >39)
LDL Chol Calc (NIH): 93 mg/dL (ref 0–99)
Triglycerides: 158 mg/dL — ABNORMAL HIGH (ref 0–149)
VLDL Cholesterol Cal: 27 mg/dL (ref 5–40)

## 2021-07-26 LAB — CBC WITH DIFFERENTIAL/PLATELET
Basophils Absolute: 0.1 10*3/uL (ref 0.0–0.2)
Basos: 1 %
EOS (ABSOLUTE): 0.2 10*3/uL (ref 0.0–0.4)
Eos: 3 %
Hematocrit: 36.5 % (ref 34.0–46.6)
Hemoglobin: 12.4 g/dL (ref 11.1–15.9)
Immature Grans (Abs): 0 10*3/uL (ref 0.0–0.1)
Immature Granulocytes: 0 %
Lymphocytes Absolute: 2.5 10*3/uL (ref 0.7–3.1)
Lymphs: 33 %
MCH: 28.8 pg (ref 26.6–33.0)
MCHC: 34 g/dL (ref 31.5–35.7)
MCV: 85 fL (ref 79–97)
Monocytes Absolute: 0.4 10*3/uL (ref 0.1–0.9)
Monocytes: 5 %
Neutrophils Absolute: 4.3 10*3/uL (ref 1.4–7.0)
Neutrophils: 58 %
Platelets: 273 10*3/uL (ref 150–450)
RBC: 4.3 x10E6/uL (ref 3.77–5.28)
RDW: 13 % (ref 11.7–15.4)
WBC: 7.4 10*3/uL (ref 3.4–10.8)

## 2021-07-26 LAB — CMP14+EGFR
ALT: 16 IU/L (ref 0–32)
AST: 20 IU/L (ref 0–40)
Albumin/Globulin Ratio: 1.4 (ref 1.2–2.2)
Albumin: 4.5 g/dL (ref 3.7–4.7)
Alkaline Phosphatase: 90 IU/L (ref 44–121)
BUN/Creatinine Ratio: 13 (ref 12–28)
BUN: 19 mg/dL (ref 8–27)
Bilirubin Total: 0.3 mg/dL (ref 0.0–1.2)
CO2: 20 mmol/L (ref 20–29)
Calcium: 9.8 mg/dL (ref 8.7–10.3)
Chloride: 105 mmol/L (ref 96–106)
Creatinine, Ser: 1.42 mg/dL — ABNORMAL HIGH (ref 0.57–1.00)
Globulin, Total: 3.2 g/dL (ref 1.5–4.5)
Glucose: 90 mg/dL (ref 70–99)
Potassium: 5.1 mmol/L (ref 3.5–5.2)
Sodium: 140 mmol/L (ref 134–144)
Total Protein: 7.7 g/dL (ref 6.0–8.5)
eGFR: 38 mL/min/{1.73_m2} — ABNORMAL LOW (ref >59)

## 2021-07-28 ENCOUNTER — Other Ambulatory Visit: Payer: Self-pay | Admitting: Nurse Practitioner

## 2021-07-28 NOTE — Telephone Encounter (Signed)
LOV- 07/25/21 ?12/13/2020 #20 R-0 ?Please advise on refill  ?

## 2021-08-01 ENCOUNTER — Ambulatory Visit (INDEPENDENT_AMBULATORY_CARE_PROVIDER_SITE_OTHER): Payer: Medicare Other

## 2021-08-01 VITALS — Ht 62.0 in | Wt 136.0 lb

## 2021-08-01 DIAGNOSIS — Z0000 Encounter for general adult medical examination without abnormal findings: Secondary | ICD-10-CM | POA: Diagnosis not present

## 2021-08-01 NOTE — Patient Instructions (Addendum)
Sheri Russell , ?Thank you for taking time to come for your Medicare Wellness Visit. I appreciate your ongoing commitment to your health goals. Please review the following plan we discussed and let me know if I can assist you in the future.  ? ?Screening recommendations/referrals: ?Colonoscopy: No longer required.  ? ?Mammogram: Done 10/27/2020 Repeat annually ? ?Bone Density: Done 07/25/2021. Repeat every 2 years ? ?Recommended yearly ophthalmology/optometry visit for glaucoma screening and checkup ?Recommended yearly dental visit for hygiene and checkup ? ?Vaccinations: ?Influenza vaccine: Done 01/20/2021 Repeat annually ? ?Pneumococcal vaccine: Done 04/19/2019, 03/20/2016 and 07/29/2014 ?Tdap vaccine: Done 07/23/2014 Repeat in 10 years ? ?Shingles vaccine: Discussed.   ?Covid-19:Done 03/03/2020, 06/27/2019 and 05/29/2019 ? ?Advanced directives: Please bring a copy of your health care power of attorney and living will to the office to be added to your chart at your convenience. ? ? ?Conditions/risks identified: Aim for 30 minutes of exercise or brisk walking, 6-8 glasses of water, and 5 servings of fruits and vegetables each day. ?KEEP UP THE GOOD WORK!! ? ?Next appointment: Follow up in one year for your annual wellness visit 08/07/2022 @ 10:30 AM.  ? ? ?Preventive Care 76 Years and Older, Female ?Preventive care refers to lifestyle choices and visits with your health care provider that can promote health and wellness. ?What does preventive care include? ?A yearly physical exam. This is also called an annual well check. ?Dental exams once or twice a year. ?Routine eye exams. Ask your health care provider how often you should have your eyes checked. ?Personal lifestyle choices, including: ?Daily care of your teeth and gums. ?Regular physical activity. ?Eating a healthy diet. ?Avoiding tobacco and drug use. ?Limiting alcohol use. ?Practicing safe sex. ?Taking low-dose aspirin every day. ?Taking vitamin and mineral supplements as  recommended by your health care provider. ?What happens during an annual well check? ?The services and screenings done by your health care provider during your annual well check will depend on your age, overall health, lifestyle risk factors, and family history of disease. ?Counseling  ?Your health care provider may ask you questions about your: ?Alcohol use. ?Tobacco use. ?Drug use. ?Emotional well-being. ?Home and relationship well-being. ?Sexual activity. ?Eating habits. ?History of falls. ?Memory and ability to understand (cognition). ?Work and work Statistician. ?Reproductive health. ?Screening  ?You may have the following tests or measurements: ?Height, weight, and BMI. ?Blood pressure. ?Lipid and cholesterol levels. These may be checked every 5 years, or more frequently if you are over 62 years old. ?Skin check. ?Lung cancer screening. You may have this screening every year starting at age 48 if you have a 30-pack-year history of smoking and currently smoke or have quit within the past 15 years. ?Fecal occult blood test (FOBT) of the stool. You may have this test every year starting at age 26. ?Flexible sigmoidoscopy or colonoscopy. You may have a sigmoidoscopy every 5 years or a colonoscopy every 10 years starting at age 107. ?Hepatitis C blood test. ?Hepatitis B blood test. ?Sexually transmitted disease (STD) testing. ?Diabetes screening. This is done by checking your blood sugar (glucose) after you have not eaten for a while (fasting). You may have this done every 1-3 years. ?Bone density scan. This is done to screen for osteoporosis. You may have this done starting at age 110. ?Mammogram. This may be done every 1-2 years. Talk to your health care provider about how often you should have regular mammograms. ?Talk with your health care provider about your test results, treatment options,  and if necessary, the need for more tests. ?Vaccines  ?Your health care provider may recommend certain vaccines, such  as: ?Influenza vaccine. This is recommended every year. ?Tetanus, diphtheria, and acellular pertussis (Tdap, Td) vaccine. You may need a Td booster every 10 years. ?Zoster vaccine. You may need this after age 58. ?Pneumococcal 13-valent conjugate (PCV13) vaccine. One dose is recommended after age 20. ?Pneumococcal polysaccharide (PPSV23) vaccine. One dose is recommended after age 29. ?Talk to your health care provider about which screenings and vaccines you need and how often you need them. ?This information is not intended to replace advice given to you by your health care provider. Make sure you discuss any questions you have with your health care provider. ?Document Released: 04/16/2015 Document Revised: 12/08/2015 Document Reviewed: 01/19/2015 ?Elsevier Interactive Patient Education ? 2017 [place]. ? ?Fall Prevention in the Home ?Falls can cause injuries. They can happen to people of all ages. There are many things you can do to make your home safe and to help prevent falls. ?What can I do on the outside of my home? ?Regularly fix the edges of walkways and driveways and fix any cracks. ?Remove anything that might make you trip as you walk through a door, such as a raised step or threshold. ?Trim any bushes or trees on the path to your home. ?Use bright outdoor lighting. ?Clear any walking paths of anything that might make someone trip, such as rocks or tools. ?Regularly check to see if handrails are loose or broken. Make sure that both sides of any steps have handrails. ?Any raised decks and porches should have guardrails on the edges. ?Have any leaves, snow, or ice cleared regularly. ?Use sand or salt on walking paths during winter. ?Clean up any spills in your garage right away. This includes oil or grease spills. ?What can I do in the bathroom? ?Use night lights. ?Install grab bars by the toilet and in the tub and shower. Do not use towel bars as grab bars. ?Use non-skid mats or decals in the tub or  shower. ?If you need to sit down in the shower, use a plastic, non-slip stool. ?Keep the floor dry. Clean up any water that spills on the floor as soon as it happens. ?Remove soap buildup in the tub or shower regularly. ?Attach bath mats securely with double-sided non-slip rug tape. ?Do not have throw rugs and other things on the floor that can make you trip. ?What can I do in the bedroom? ?Use night lights. ?Make sure that you have a light by your bed that is easy to reach. ?Do not use any sheets or blankets that are too big for your bed. They should not hang down onto the floor. ?Have a firm chair that has side arms. You can use this for support while you get dressed. ?Do not have throw rugs and other things on the floor that can make you trip. ?What can I do in the kitchen? ?Clean up any spills right away. ?Avoid walking on wet floors. ?Keep items that you use a lot in easy-to-reach places. ?If you need to reach something above you, use a strong step stool that has a grab bar. ?Keep electrical cords out of the way. ?Do not use floor polish or wax that makes floors slippery. If you must use wax, use non-skid floor wax. ?Do not have throw rugs and other things on the floor that can make you trip. ?What can I do with my stairs? ?Do not leave  any items on the stairs. ?Make sure that there are handrails on both sides of the stairs and use them. Fix handrails that are broken or loose. Make sure that handrails are as long as the stairways. ?Check any carpeting to make sure that it is firmly attached to the stairs. Fix any carpet that is loose or worn. ?Avoid having throw rugs at the top or bottom of the stairs. If you do have throw rugs, attach them to the floor with carpet tape. ?Make sure that you have a light switch at the top of the stairs and the bottom of the stairs. If you do not have them, ask someone to add them for you. ?What else can I do to help prevent falls? ?Wear shoes that: ?Do not have high heels. ?Have  rubber bottoms. ?Are comfortable and fit you well. ?Are closed at the toe. Do not wear sandals. ?If you use a stepladder: ?Make sure that it is fully opened. Do not climb a closed stepladder. ?Make sure that b

## 2021-08-01 NOTE — Progress Notes (Signed)
? ?Subjective:  ? Sheri Russell is a 76 y.o. female who presents for Medicare Annual (Subsequent) preventive examination. ?Virtual Visit via Telephone Note ? ?I connected with  Sheri Russell on 08/01/21 at 10:30 AM EDT by telephone and verified that I am speaking with the correct person using two identifiers. ? ?Location: ?Patient: HOME ?Provider: [facility] ?Persons participating in the virtual visit: patient/Nurse Health Advisor ?  ?I discussed the limitations, risks, security and privacy concerns of performing an evaluation and management service by telephone and the availability of in person appointments. The patient expressed understanding and agreed to proceed. ? ?Interactive audio and video telecommunications were attempted between this nurse and patient, however failed, due to patient having technical difficulties OR patient did not have access to video capability.  We continued and completed visit with audio only. ? ?Some vital signs may be absent or patient reported.  ? ?Sheri Driver, LPN ? ?Review of Systems    ? ? ?Cardiac Risk Factors include: advanced age (>80mn, >>73women);dyslipidemia;hypertension;sedentary lifestyle ? ?   ?Objective:  ?  ?Today's Vitals  ? 08/01/21 1028  ?Weight: 136 lb (61.7 kg)  ?Height: '5\' 2"'$  (1.575 m)  ? ?Body mass index is 24.87 kg/m?. ? ? ?  08/01/2021  ? 10:37 AM 07/23/2019  ?  9:39 AM 01/14/2018  ?  2:48 PM 01/08/2017  ?  3:17 PM 01/08/2017  ?  2:38 PM 03/13/2014  ?  2:45 PM  ?Advanced Directives  ?Does Patient Have a Medical Advance Directive? Yes Yes Yes  No Yes  ?Type of AParamedicof AAndersonLiving will [place]ayLiving will Living will;Healthcare Power of AHoly CrossLiving will    ?Does patient want to make changes to medical advance directive?  No - Patient declined Yes (MAU/Ambulatory/Procedural Areas - Information given)     ?Copy of H[place]in Chart? No - copy requested No - copy  requested No - copy requested No - copy requested  No - copy requested  ? ? ?Current Medications (verified) ?Outpatient Encounter Medications as of 08/01/2021  ?Medication Sig  ? enalapril (VASOTEC) 20 MG tablet Take 1 tablet (20 mg total) by mouth 2 (two) times daily.  ? ipratropium (ATROVENT) 0.06 % nasal spray 2 sprays into each nostril Three (3) times a day.  ? meclizine (ANTIVERT) 25 MG tablet TAKE 1 TABLET BY MOUTH THREE TIMES DAILY AS NEEDED FOR DIZZINESS  ? metroNIDAZOLE (METROGEL) 1 % gel Apply topically daily.  ? Omega-3 Fatty Acids (FISH OIL) 1000 MG CPDR Take 1-2 capsules by mouth daily.  ? ondansetron (ZOFRAN) 4 MG tablet TAKE 1 TABLET BY MOUTH EVERY 8 HOURS AS NEEDED FOR NAUSEA AND VOMITING  ? oxybutynin (DITROPAN) 5 MG tablet Take 1 tablet (5 mg total) by mouth at bedtime.  ? VENTOLIN HFA 108 (90 Base) MCG/ACT inhaler INHALE 2 PUFFS BY MOUTH EVERY 6 HOURS AS NEEDED FOR WHEEZING OR SHORTNESS OF BREATH  ? ?No facility-administered encounter medications on file as of 08/01/2021.  ? ? ?Allergies (verified) ?Fish allergy, Iohexol, Iodinated contrast media, and Other  ? ?History: ?Past Medical History:  ?Diagnosis Date  ? Allergic rhinitis   ? Anxiety   ? Asthma   ? [place]  ? [facility] ([facility]  ? pelvic - oncologist -Dr. RAugust Saucer([facility])  ? [place]   ? Colon polyps   ? Hyperlipidemia   ? Hypertension   ? ?Past  Surgical History:  ?Procedure Laterality Date  ? APPENDECTOMY  1986  ? CHOLECYSTECTOMY  1985  ? colostomy bag    ? Several cancer surgeries  1971, 1985, & 1986  ? TOTAL ABDOMINAL HYSTERECTOMY  1986  ? ?Family History  ?Problem Relation Age of Onset  ? Diabetes Mother   ? Hypertension Mother   ? Heart attack Mother   ? Heart disease Mother   ? Diabetes Father   ? Hypertension Father   ? Heart attack Father   ? Heart disease Father   ? Diabetes Maternal Grandmother   ? Hypertension Maternal Grandmother   ? Heart attack Maternal Grandmother   ? Diabetes  Maternal Grandfather   ? Hypertension Maternal Grandfather   ? Heart attack Maternal Grandfather   ? Diabetes Paternal Grandmother   ? Hypertension Paternal Grandmother   ? Heart attack Paternal Grandmother   ? Diabetes Paternal Grandfather   ? Hypertension Paternal Grandfather   ? Heart attack Paternal Grandfather   ? Stroke Brother   ? Hypertension Brother   ? Asthma Brother   ? Liver cancer Brother   ? Alcohol abuse Brother   ? Cancer Sister   ?     colon cancer   ? Diabetes Sister   ? Diabetes Sister   ? Diabetes Sister   ? Hypertension Brother   ? Diabetes Brother   ? ?Social History  ? ?Socioeconomic History  ? Marital status: Widowed  ?  Spouse name: Not on file  ? Number of children: 1  ? Years of education: 8  ? Highest education level: 8th grade  ?Occupational History  ? Occupation: Actuary  ?  Comment: sits part time with someone  ?Tobacco Use  ? Smoking status: Former  ?  Types: Cigarettes  ?  Quit date: 48  ?  Years since quitting: 45.3  ? Smokeless tobacco: Never  ?Vaping Use  ? Vaping Use: Never used  ?Substance and Sexual Activity  ? Alcohol use: Yes  ?  Alcohol/week: 1.0 standard drink  ?  Types: 1 Glasses of wine per week  ?  Comment: occasionally   ? Drug use: No  ? Sexual activity: Not Currently  ?Other Topics Concern  ? Not on file  ?Social History Narrative  ? Lives alone   ? Daughter in [place]  ? 1 grandchild   ? ?Social Determinants of Health  ? ?Financial Resource Strain: Low Risk   ? Difficulty of Paying Living Expenses: Not hard at all  ?Food Insecurity: No Food Insecurity  ? Worried About Charity fundraiser in the Last Year: Never true  ? Ran Out of Food in the Last Year: Never true  ?Transportation Needs: No Transportation Needs  ? Lack of Transportation (Medical): No  ? Lack of Transportation (Non-Medical): No  ?Physical Activity: Sufficiently Active  ? Days of Exercise per Week: 7 days  ? Minutes of Exercise per Session: 30 min  ?Stress: No Stress Concern Present  ? Feeling of  Stress : Only a little  ?Social Connections: Moderately Integrated  ? Frequency of Communication with Friends and Family: More than three times a week  ? Frequency of Social Gatherings with Friends and Family: More than three times a week  ? Attends Religious Services: More than 4 times per year  ? Active Member of Clubs or Organizations: Yes  ? Attends Archivist Meetings: 1 to 4 times per year  ? Marital Status: Widowed  ? ? ?Tobacco Counseling ?  Counseling given: Not Answered ? ? ?Clinical Intake: ? ?Pre-visit preparation completed: Yes ? ?Pain : No/denies pain ? ?  ? ?BMI - recorded: 24.87 ?Nutritional Status: BMI of 19-24  Normal ?Nutritional Risks: None ?Diabetes: No ? ?How often do you need to have someone help you when you read instructions, pamphlets, or other written materials from your doctor or pharmacy?: 1 - Never ? ?Diabetic?NO ? ?Interpreter Needed?: No ? ?Information entered by :: mj Thursa Emme, lpn ? ? ?Activities of Daily Living ? ?  08/01/2021  ? 10:41 AM  ?In your present state of health, do you have any difficulty performing the following activities:  ?Hearing? 0  ?Vision? 0  ?Difficulty concentrating or making decisions? 1  ?Comment memory at time.  ?Walking or climbing stairs? 0  ?Dressing or bathing? 0  ?Doing errands, shopping? 0  ?Preparing Food and eating ? N  ?Using the Toilet? N  ?In the past six months, have you accidently leaked urine? Y  ?Do you have problems with loss of bowel control? N  ?Managing your Medications? N  ?Managing your Finances? N  ?Housekeeping or managing your Housekeeping? N  ? ? ?Patient Care Team: ?[name], FNP as PCP - General (Nurse Practitioner) ?Leticia Clas, OD (Optometry) ?Katha Cabal, LCSW as Education officer, museum (Licensed Holiday representative) ?Alwyn Pea, NP as Nurse Practitioner (Oncology) ?Christianne Borrow, MD (Obstetrics) ? ?Indicate any recent Medical Services you may have received from other than [facility] providers in the past  year (date may be approximate). ? ?   ?Assessment:  ? This is a routine wellness examination for Sheri Russell. ? ?Hearing/Vision screen ?Hearing Screening - Comments:: Some hearing issues.  ?Vision Screening - Comm

## 2021-08-02 ENCOUNTER — Telehealth: Payer: Medicare Other

## 2021-09-08 ENCOUNTER — Other Ambulatory Visit: Payer: Self-pay | Admitting: Nurse Practitioner

## 2021-09-26 DIAGNOSIS — H401131 Primary open-angle glaucoma, bilateral, mild stage: Secondary | ICD-10-CM | POA: Diagnosis not present

## 2021-10-24 ENCOUNTER — Encounter: Payer: Self-pay | Admitting: Nurse Practitioner

## 2021-10-24 ENCOUNTER — Ambulatory Visit (INDEPENDENT_AMBULATORY_CARE_PROVIDER_SITE_OTHER): Payer: Medicare Other | Admitting: Nurse Practitioner

## 2021-10-24 VITALS — BP 129/87 | HR 103 | Temp 98.8°F | Ht 62.0 in | Wt 136.6 lb

## 2021-10-24 DIAGNOSIS — L239 Allergic contact dermatitis, unspecified cause: Secondary | ICD-10-CM

## 2021-10-24 MED ORDER — PREDNISONE 20 MG PO TABS: 20.0000 mg | ORAL_TABLET | Freq: Every day | ORAL | 0 refills | Status: DC

## 2021-10-24 NOTE — Patient Instructions (Signed)
Insect Bite, Adult An insect bite can make your skin red, itchy, and swollen. Some insects can spread disease to people with a bite. However, most insect bites do not lead to disease, and most are not serious. What are the causes? Insects may bite for many reasons, including: Hunger. To defend themselves. Insects that bite include: Spiders. Mosquitoes. Ticks. Fleas. Ants. Flies. Kissing bugs. Chiggers. What are the signs or symptoms? Symptoms of this condition include: Itching or pain in the bite area. Redness and swelling in the bite area. An open wound (skin ulcer). Symptoms often last for 2-4 days. In rare cases, a person may have a very bad allergic reaction (anaphylactic reaction) to a bite. Symptoms of an anaphylactic reaction may include: Feeling warm in the face (flushed). Your face may turn red. Itchy, red, swollen areas of skin (hives). Swelling of the: Eyes. Lips. Face. Mouth. Tongue. Throat. Trouble with any of these: Breathing. Talking. Swallowing. Loud breathing (wheezing). Feeling dizzy or light-headed. Passing out (fainting). Pain or cramps in your belly. Throwing up (vomiting). Watery poop (diarrhea). How is this treated? Treatment is usually not needed. Symptoms often go away on their own. When treatment is needed, it may involve: Putting a cream or lotion on the bite area. This helps with itching. Taking an antibiotic medicine. This treatment is needed if the bite area gets infected. Getting a tetanus shot, if you are not up to date on this vaccine. Putting ice on the affected area. Using medicines called antihistamines. This treatment may be needed if you have itching or an allergic reaction to the insect bite. Giving yourself a shot of medicine (epinephrine) using an auto-injector "pen" if you have an anaphylactic reaction to a bite. Your doctor will teach you how to use this pen. Follow these instructions at home: Bite area care  Do not  scratch the bite area. Keep the bite area clean and dry. Wash the bite area every day with soap and water as told by your doctor. Check the bite area every day for signs of infection. Check for: Redness, swelling, or pain. Fluid or blood. Warmth. Pus or a bad smell. Managing pain, itching, and swelling  You may put any of these on the bite area as told by your doctor: A paste made of baking soda and water. Cortisone cream. Calamine lotion. If told, put ice on the bite area. Put ice in a plastic bag. Place a towel between your skin and the bag. Leave the ice on for 20 minutes, 2-3 times a day. General instructions Apply or take over-the-counter and prescription medicines only as told by your doctor. If you were prescribed an antibiotic medicine, take or apply it as told by your doctor. Do not stop using the antibiotic even if your condition improves. Keep all follow-up visits as told by your doctor. This is important. How is this prevented? To help you have a lower risk of insect bites: When you are outside, wear clothing that covers your arms and legs. Use insect repellent. The best insect repellents contain one of these: DEET. Picaridin. Oil of lemon eucalyptus (OLE). [id number]. Consider spraying your clothing with a pesticide called permethrin. Permethrin helps prevent insect bites. It works for several weeks and for up to 5-6 clothing washes. Do not apply permethrin directly to the skin. If your home windows do not have screens, think about putting some in. If you will be sleeping in an area where there are mosquitoes, consider covering your sleeping area with a mosquito  net. Contact a doctor if: You have redness, swelling, or pain in the bite area. You have fluid or blood coming from the bite area. The bite area feels warm to the touch. You have pus or a bad smell coming from the bite area. You have a fever. Get help right away if: You have joint pain. You have a rash. You  feel more tired or sleepy than you normally do. You have neck pain. You have a headache. You feel weaker than you normally do. You have signs of an anaphylactic reaction. Signs may include: Feeling warm in the face. Itchy, red, swollen areas of skin. Swelling of your: Eyes. Lips. Face. Mouth. Tongue. Throat. Trouble with any of these: Breathing. Talking. Swallowing. Loud breathing. Feeling dizzy or light-headed. Passing out. Pain or cramps in your belly. Throwing up. Watery poop. These symptoms may be an emergency. Do not wait to see if the symptoms will go away. Do this right away: Use your auto-injector pen as you have been told. Get medical help. Call your local emergency services (911 in the [place] Do not drive yourself to the hospital. Summary An insect bite can make your skin red, itchy, and swollen. Treatment is usually not needed. Symptoms often go away on their own. Do not scratch the bite area. Keep it clean and dry. Ice can help with pain and itching from the bite. This information is not intended to replace advice given to you by your health care provider. Make sure you discuss any questions you have with your health care provider. Document Revised: 12/20/2020 Document Reviewed: 12/20/2020 Elsevier Patient Education  [place].

## 2021-10-24 NOTE — Progress Notes (Signed)
Acute Office Visit  Subjective:     Patient ID: Sheri Russell, female    DOB: 1946/03/23, 76 y.o.   MRN: [id number]  Chief Complaint  Patient presents with   Insect Bite    Started Friday morning    Urticaria    In head , and down her face now     Rash This is a new problem. The current episode started yesterday. The problem is unchanged. The affected locations include the scalp (right top of eye brow). The rash is characterized by itchiness and redness. Associated with: bug bite. Pertinent negatives include no anorexia, congestion, cough, eye pain, facial edema, fatigue, shortness of breath or vomiting. Past treatments include nothing.     Review of Systems  Constitutional: Negative.  Negative for fatigue.  HENT: Negative.  Negative for congestion.   Eyes:  Negative for pain.  Respiratory: Negative.  Negative for cough and shortness of breath.   Cardiovascular: Negative.   Gastrointestinal:  Negative for anorexia and vomiting.  Skin:  Positive for rash.  All other systems reviewed and are negative.       Objective:    BP 129/87   Pulse (!) 103   Temp 98.8 F (37.1 C)   Ht '5\' 2"'$  (1.575 m)   Wt 136 lb 9.6 oz (62 kg)   LMP 07/09/1975   SpO2 97%   BMI 24.98 kg/m  BP Readings from Last 3 Encounters:  10/24/21 129/87  07/25/21 111/69  01/20/21 126/73   Wt Readings from Last 3 Encounters:  10/24/21 136 lb 9.6 oz (62 kg)  08/01/21 136 lb (61.7 kg)  07/25/21 136 lb (61.7 kg)      Physical Exam Vitals and nursing note reviewed.  Constitutional:      Appearance: Normal appearance.  HENT:     Head: Normocephalic.     Right Ear: External ear normal.     Left Ear: External ear normal.     Nose: Nose normal. No congestion.     Mouth/Throat:     Mouth: Mucous membranes are moist.     Pharynx: Oropharynx is clear.  Eyes:     Conjunctiva/sclera: Conjunctivae normal.  Cardiovascular:     Rate and Rhythm: Normal rate and regular rhythm.     Pulses: Normal pulses.      Heart sounds: Normal heart sounds.  Pulmonary:     Effort: Pulmonary effort is normal.     Breath sounds: Normal breath sounds.  Abdominal:     General: Bowel sounds are normal.  Musculoskeletal:     Cervical back: Normal range of motion.  Skin:    General: Skin is warm.     Findings: Rash present.  Neurological:     Mental Status: She is alert and oriented to person, place, and time.     No results found for any visits on 10/24/21.      Assessment & Plan:  Patient presents with a rash on her scalp and right side of her face.  Patient patient reports walking her dog when a bug flew overhead and bit her on the scalp.  Patient is called is red and pruritic.  She woke up this morning with right-sided rash above her eyebrow.  Started patient on Depo-Medrol 40 in clinic, 20 mg prednisone daily for 6 days.  Advised patient to keep an eye on rash for changes or spreading and return to clinic with worsening unresolved symptoms.  Patient verbalized understanding.  Printed handouts given. Problem List Items  Addressed This Visit   None Visit Diagnoses     Allergic dermatitis    -  Primary   Relevant Medications   predniSONE (DELTASONE) 20 MG tablet       Meds ordered this encounter  Medications   predniSONE (DELTASONE) 20 MG tablet    Sig: Take 1 tablet (20 mg total) by mouth daily with breakfast.    Dispense:  6 tablet    Refill:  0    Order Specific Question:   Supervising Provider    Answer:   Jeneen Rinks    Return if symptoms worsen or fail to improve.  Ivy Lynn, NP

## 2021-10-25 ENCOUNTER — Telehealth: Payer: Self-pay | Admitting: Nurse Practitioner

## 2021-10-25 NOTE — Telephone Encounter (Signed)
Patient was seen yesterday for a bug bite, given Depo Medrol injection and started on Prednisone which she began this morning.  Patient does not feel that area has improved and is complaining of area hurting.  I explained to patient that she has just begun the medication and it will take time to work but she wanted me to ask if you will send in an antibiotic also.  Patient uses Product/process development scientist.

## 2021-10-25 NOTE — Telephone Encounter (Signed)
Please have patient give medication at least 3 days it's less than 24 hours. Thank you

## 2021-10-26 ENCOUNTER — Telehealth: Payer: Self-pay | Admitting: Nurse Practitioner

## 2021-10-26 DIAGNOSIS — B0229 Other postherpetic nervous system involvement: Secondary | ICD-10-CM | POA: Diagnosis not present

## 2021-10-26 NOTE — Telephone Encounter (Signed)
Patient appt made with MMM on 07/28

## 2021-10-26 NOTE — Telephone Encounter (Signed)
Spoke with pt. She believes that the rash is now shingles. Informed that she needs to be evaluated again. Offered an appt by phone this afternoon with DOD and an in office visit with Sharyn Lull at 2:35pm. Pt wanted to be seen now. States that she will go to the Urgent Care since she is not able to be seen at this time.

## 2021-10-28 ENCOUNTER — Encounter: Payer: Self-pay | Admitting: Nurse Practitioner

## 2021-10-28 ENCOUNTER — Ambulatory Visit (INDEPENDENT_AMBULATORY_CARE_PROVIDER_SITE_OTHER): Payer: Medicare Other | Admitting: Nurse Practitioner

## 2021-10-28 VITALS — BP 150/86 | HR 82 | Temp 97.7°F | Resp 20 | Ht 62.0 in | Wt 136.0 lb

## 2021-10-28 DIAGNOSIS — B029 Zoster without complications: Secondary | ICD-10-CM

## 2021-10-28 NOTE — Patient Instructions (Signed)

## 2021-10-28 NOTE — Progress Notes (Signed)
   Subjective:    Patient ID: Sheri Russell, female    DOB: [birth year], 76 y.o.   MRN: [id number]   Chief Complaint: Recheck shingrles infection near eye   HPI Patient was dx with shingles on 10/24/21. She was given steroids. Has spread some. Is painful and burning. Rate [place] 5/10 currently. She went to urgent care on 10/26/21 and was dx with shingles and was given valtrrex.    Review of Systems  Constitutional:  Negative for diaphoresis.  Eyes:  Negative for pain.  Respiratory:  Negative for shortness of breath.   Cardiovascular:  Negative for chest pain, palpitations and leg swelling.  Gastrointestinal:  Negative for abdominal pain.  Endocrine: Negative for polydipsia.  Skin:  Negative for rash.  Neurological:  Negative for dizziness, weakness and headaches.  Hematological:  Does not bruise/bleed easily.  All other systems reviewed and are negative.      Objective:   Physical Exam Vitals reviewed.  Constitutional:      Appearance: Normal appearance.  Cardiovascular:     Rate and Rhythm: Normal rate and regular rhythm.     Heart sounds: Normal heart sounds.  Pulmonary:     Effort: Pulmonary effort is normal.     Breath sounds: Normal breath sounds.  Skin:    General: Skin is warm.     Comments: Vesicular lesions on right temple.  Neurological:     General: No focal deficit present.     Mental Status: She is alert and oriented to person, place, and time.  Psychiatric:        Mood and Affect: Mood normal.        Behavior: Behavior normal.     BP (!) 150/86   Pulse 82   Temp 97.7 F (36.5 C) (Temporal)   Resp 20   Ht '5\' 2"'$  (1.575 m)   Wt 136 lb (61.7 kg)   LMP 07/09/1975   SpO2 98%   BMI 24.87 kg/m        Assessment & Plan:   Sheri Russell in today with chief complaint of Recheck shingrles infection near eye   1. Herpes zoster without complication Avoid rubbing Cool compresses Avoid scratching    The above assessment and management plan was  discussed with the patient. The patient verbalized understanding of and has agreed to the management plan. Patient is aware to call the clinic if symptoms persist or worsen. Patient is aware when to return to the clinic for a follow-up visit. Patient educated on when it is appropriate to go to the emergency department.   Mary-Margaret Hassell Done, FNP

## 2022-01-24 ENCOUNTER — Encounter: Payer: Self-pay | Admitting: Nurse Practitioner

## 2022-01-24 ENCOUNTER — Ambulatory Visit (INDEPENDENT_AMBULATORY_CARE_PROVIDER_SITE_OTHER): Payer: Medicare Other | Admitting: Nurse Practitioner

## 2022-01-24 VITALS — BP 114/70 | HR 81 | Temp 97.9°F | Resp 20 | Ht 62.0 in | Wt 136.0 lb

## 2022-01-24 DIAGNOSIS — E785 Hyperlipidemia, unspecified: Secondary | ICD-10-CM | POA: Diagnosis not present

## 2022-01-24 DIAGNOSIS — Z23 Encounter for immunization: Secondary | ICD-10-CM

## 2022-01-24 DIAGNOSIS — N3281 Overactive bladder: Secondary | ICD-10-CM

## 2022-01-24 DIAGNOSIS — I10 Essential (primary) hypertension: Secondary | ICD-10-CM | POA: Diagnosis not present

## 2022-01-24 DIAGNOSIS — C52 Malignant neoplasm of vagina: Secondary | ICD-10-CM | POA: Diagnosis not present

## 2022-01-24 DIAGNOSIS — M8588 Other specified disorders of bone density and structure, other site: Secondary | ICD-10-CM

## 2022-01-24 MED ORDER — ENALAPRIL MALEATE 20 MG PO TABS: 20.0000 mg | ORAL_TABLET | Freq: Two times a day (BID) | ORAL | 1 refills | Status: DC

## 2022-01-24 MED ORDER — OXYBUTYNIN CHLORIDE 5 MG PO TABS: 5.0000 mg | ORAL_TABLET | Freq: Every day | ORAL | 1 refills | Status: DC

## 2022-01-24 NOTE — Progress Notes (Signed)
Subjective:    Patient ID: Sheri Russell, female    DOB: [birth year], 76 y.o.   MRN: [id number]   Chief Complaint: medical management of chronic issues     HPI:  Sheri Russell is a 76 y.o. who identifies as a female who was assigned female at birth.   Social history: Lives with: by herself Work history: retired   Scientist, forensic in today for follow up of the following chronic medical issues:  1. Essential hypertension, benign No c/o chest pain, sob or headache. Does not check blood pressure at home. BP Readings from Last 3 Encounters:  10/28/21 (!) 150/86  10/24/21 129/87  07/25/21 111/69     2. Hyperlipidemia with target LDL less than 100 Does try to wtahc diet and stays very active. Lab Results  Component Value Date   CHOL 184 07/25/2021   HDL 64 07/25/2021   LDLCALC 93 07/25/2021   TRIG 158 (H) 07/25/2021   CHOLHDL 2.9 07/25/2021     3. Overactive bladder Is on ditropan daily and is doing well.  4. 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  5. Osteopenia of lumbar spine Last dexascan was done 07/25/21. Her t score was -2.3   New complaints: None today  Allergies  Allergen Reactions   Fish Allergy    Iohexol     IVP Dye    Iodinated Contrast Media Rash   Other Hives and Rash    Seafood perch, red snapper any seafood with iodine   Outpatient Encounter Medications as of 01/24/2022  Medication Sig   enalapril (VASOTEC) 20 MG tablet Take 1 tablet (20 mg total) by mouth 2 (two) times daily.   ipratropium (ATROVENT) 0.06 % nasal spray 2 sprays into each nostril Three (3) times a day.   meclizine (ANTIVERT) 25 MG tablet TAKE 1 TABLET BY MOUTH THREE TIMES DAILY AS NEEDED FOR DIZZINESS   metroNIDAZOLE (METROGEL) 1 % gel Apply topically daily.   Omega-3 Fatty Acids (FISH OIL) 1000 MG CPDR Take 1-2 capsules by mouth daily.   ondansetron (ZOFRAN) 4 MG tablet TAKE 1 TABLET BY MOUTH EVERY 8 HOURS AS NEEDED FOR NAUSEA AND VOMITING    oxybutynin (DITROPAN) 5 MG tablet Take 1 tablet (5 mg total) by mouth at bedtime.   predniSONE (DELTASONE) 20 MG tablet Take 1 tablet (20 mg total) by mouth daily with breakfast.   valACYclovir (VALTREX) 1000 MG tablet Take 1,000 mg by mouth 3 (three) times daily.   VENTOLIN HFA 108 (90 Base) MCG/ACT inhaler INHALE 2 PUFFS BY MOUTH EVERY 6 HOURS AS NEEDED FOR WHEEZING AND FOR SHORTNESS OF BREATH   No facility-administered encounter medications on file as of 01/24/2022.    Past Surgical History:  Procedure Laterality Date   APPENDECTOMY  1986   CHOLECYSTECTOMY  1985   colostomy bag     Several cancer surgeries  1971, [place]    Family History  Problem Relation Age of Onset   Diabetes Mother    Hypertension Mother    Heart attack Mother    Heart disease Mother    Diabetes Father    Hypertension Father    Heart attack Father    Heart disease Father    Diabetes Maternal Grandmother    Hypertension Maternal Grandmother    Heart attack Maternal Grandmother    Diabetes Maternal Grandfather    Hypertension Maternal Grandfather    Heart attack Maternal Grandfather  Diabetes Paternal Grandmother    Hypertension Paternal Grandmother    Heart attack Paternal Grandmother    Diabetes Paternal Grandfather    Hypertension Paternal Grandfather    Heart attack Paternal Grandfather    Stroke Brother    Hypertension Brother    Asthma Brother    Liver cancer Brother    Alcohol abuse Brother    Cancer Sister        colon cancer    Diabetes Sister    Diabetes Sister    Diabetes Sister    Hypertension Brother    Diabetes Brother       Controlled substance contract: n/a     Review of Systems  Constitutional:  Negative for diaphoresis.  Eyes:  Negative for pain.  Respiratory:  Negative for shortness of breath.   Cardiovascular:  Negative for chest pain, palpitations and leg swelling.  Gastrointestinal:  Negative for abdominal pain.   Endocrine: Negative for polydipsia.  Skin:  Negative for rash.  Neurological:  Negative for dizziness, weakness and headaches.  Hematological:  Does not bruise/bleed easily.  All other systems reviewed and are negative.      Objective:   Physical Exam Vitals and nursing note reviewed.  Constitutional:      General: She is not in acute distress.    Appearance: Normal appearance. She is well-developed.  HENT:     Head: Normocephalic.     Right Ear: Tympanic membrane normal.     Left Ear: Tympanic membrane normal.     Nose: Nose normal.     Mouth/Throat:     Mouth: Mucous membranes are moist.  Eyes:     Pupils: Pupils are equal, round, and reactive to light.  Neck:     Vascular: No carotid bruit or JVD.  Cardiovascular:     Rate and Rhythm: Normal rate and regular rhythm.     Heart sounds: Normal heart sounds.  Pulmonary:     Effort: Pulmonary effort is normal. No respiratory distress.     Breath sounds: Normal breath sounds. No wheezing or rales.  Chest:     Chest wall: No tenderness.  Abdominal:     General: Bowel sounds are normal. There is no distension or abdominal bruit.     Palpations: Abdomen is soft. There is no hepatomegaly, splenomegaly, mass or pulsatile mass.     Tenderness: There is no abdominal tenderness.  Musculoskeletal:        General: Normal range of motion.     Cervical back: Normal range of motion and neck supple.  Lymphadenopathy:     Cervical: No cervical adenopathy.  Skin:    General: Skin is warm and dry.  Neurological:     Mental Status: She is alert and oriented to person, place, and time.     Deep Tendon Reflexes: Reflexes are normal and symmetric.  Psychiatric:        Behavior: Behavior normal.        Thought Content: Thought content normal.        Judgment: Judgment normal.    BP 114/70   Pulse 81   Temp 97.9 F (36.6 C) (Temporal)   Resp 20   Ht _0  (1.575 m)   Wt 136 lb (61.7 kg)   LMP 07/09/1975   SpO2 97%   BMI 24.87  kg/m         Assessment & Plan:   Sheri Russell comes in today with chief complaint of Medical Management of Chronic Issues  Diagnosis and orders addressed:  1. Essential hypertension, benign Low sodium diet - enalapril (VASOTEC) 20 MG tablet; Take 1 tablet (20 mg total) by mouth 2 (two) times daily.  Dispense: 180 tablet; Refill: 1 - CBC with Differential/Platelet - CMP14+EGFR  2. Hyperlipidemia with target LDL less than 100 Low fat diet - Lipid panel  3. Overactive bladder Continue meds - oxybutynin (DITROPAN) 5 MG tablet; Take 1 tablet (5 mg total) by mouth at bedtime.  Dispense: 90 tablet; Refill: 1  4. Cancer of vagina ([place])  5. Osteopenia of lumbar spine Weight bearing exercise   Labs pending Health Maintenance reviewed Diet and exercise encouraged  Follow up plan: 6 months   [place], FNP

## 2022-01-24 NOTE — Patient Instructions (Signed)
Bone Health Bones protect organs, store calcium, anchor muscles, and support the whole body. Keeping your bones strong is important, especially as you get older. You can take actions to help keep your bones strong and healthy. Why is keeping my bones healthy important?  Keeping your bones healthy is important because your body constantly replaces bone cells. Cells get old, and new cells take their place. As we age, we lose bone cells because the body may not be able to make enough new cells to replace the old cells. The amount of bone cells and bone tissue you have is referred to as bone mass. The higher your bone mass, the stronger your bones. The aging process leads to an overall loss of bone mass in the body, which can increase the likelihood of: Broken bones. A condition in which the bones become weak and brittle (osteoporosis). A large decline in bone mass occurs in older adults. In women, it occurs about the time of menopause. What actions can I take to keep my bones healthy? Good health habits are important for maintaining healthy bones. This includes eating nutritious foods and exercising regularly. To have healthy bones, you need to get enough of the right minerals and vitamins. Most nutrition experts recommend getting these nutrients from the foods that you eat. In some cases, taking supplements may also be recommended. Doing certain types of exercise is also important for bone health. What are the nutritional recommendations for healthy bones?  Eating a well-balanced diet with plenty of calcium and vitamin D will help to protect your bones. Nutritional recommendations vary from person to person. Ask your health care provider what is healthy for you. Here are some general guidelines. Get enough calcium Calcium is the most important (essential) mineral for bone health. Most people can get enough calcium from their diet, but supplements may be recommended for people who are at risk for  osteoporosis. Good sources of calcium include: Dairy products, such as low-fat or nonfat milk, cheese, and yogurt. Dark green leafy vegetables, such as bok choy and broccoli. Foods that have calcium added to them (are fortified). Foods that may be fortified with calcium include orange juice, cereal, bread, soy beverages, and tofu products. Nuts, such as almonds. Follow these recommended amounts for daily calcium intake: Infants, 0-6 months: 200 mg. Infants, 6-12 months: 260 mg. Children, age 1-3: 700 mg. Children, age 4-8: 1,000 mg. Children, age 9-13: 1,300 mg. Teens, age 14-18: 1,300 mg. Adults, age 19-50: 1,000 mg. Adults, age 51-70: Men: 1,000 mg. Women: 1,200 mg. Adults, age 71 or older: 1,200 mg. Pregnant and breastfeeding females: Teens: 1,300 mg. Adults: 1,000 mg. Get enough vitamin D Vitamin D is the most essential vitamin for bone health. It helps the body absorb calcium. Sunlight stimulates the skin to make vitamin D, so be sure to get enough sunlight. If you live in a cold climate or you do not get outside often, your health care provider may recommend that you take vitamin D supplements. Good sources of vitamin D in your diet include: Egg yolks. Saltwater fish. Milk and cereal fortified with vitamin D. Follow these recommended amounts for daily vitamin D intake: Infants, 0-12 months: 400 international units (IU). Children and teens, age 1-18: 600 international units. Adults, age 59 or younger: 600 international units. Adults, age 60 or older: 600-1,000 international units. Get other important nutrients Other nutrients that are important for bone health include: Phosphorus. This mineral is found in meat, poultry, dairy foods, nuts, and legumes. The   recommended daily intake for adult men and adult women is 700 mg. Magnesium. This mineral is found in seeds, nuts, dark green vegetables, and legumes. The recommended daily intake for adult men is 400-420 mg. For adult women,  it is 310-320 mg. Vitamin K. This vitamin is found in green leafy vegetables. The recommended daily intake is 120 mcg for adult men and 90 mcg for adult women. What type of physical activity is best for building and maintaining healthy bones? Weight-bearing and strength-building activities are important for building and maintaining healthy bones. Weight-bearing activities cause muscles and bones to work against gravity. Strength-building activities increase the strength of the muscles that support bones. Weight-bearing and muscle-building activities include: Walking and hiking. Jogging and running. Dancing. Gym exercises. Lifting weights. Tennis and racquetball. Climbing stairs. Aerobics. Adults should get at least 30 minutes of moderate physical activity on most days. Children should get at least 60 minutes of moderate physical activity on most days. Ask your health care provider what type of exercise is best for you. How can I find out if my bone mass is low? Bone mass can be measured with an X-ray test called a bone mineral density (BMD) test. This test is recommended for all women who are age 65 or older. It may also be recommended for: Men who are age 70 or older. People who are at risk for osteoporosis because of: Having a long-term disease that weakens bones, such as kidney disease or rheumatoid arthritis. Having menopause earlier than normal. Taking medicine that weakens bones, such as steroids, thyroid hormones, or hormone treatment for breast cancer or prostate cancer. Smoking. Drinking three or more alcoholic drinks a day. Being underweight. Sedentary lifestyle. If you find that you have a low bone mass, you may be able to prevent osteoporosis or further bone loss by changing your diet and lifestyle. Where can I find more information? Bone Health & Osteoporosis Foundation: www.nof.org/patients National Institutes of Health: www.bones.nih.gov International Osteoporosis  Foundation: www.iofbonehealth.org Summary The aging process leads to an overall loss of bone mass in the body, which can increase the likelihood of broken bones and osteoporosis. Eating a well-balanced diet with plenty of calcium and vitamin D will help to protect your bones. Weight-bearing and strength-building activities are also important for building and maintaining strong bones. Bone mass can be measured with an X-ray test called a bone mineral density (BMD) test. This information is not intended to replace advice given to you by your health care provider. Make sure you discuss any questions you have with your health care provider. Document Revised: 09/01/2020 Document Reviewed: 09/01/2020 Elsevier Patient Education  2023 Elsevier Inc.  

## 2022-01-25 ENCOUNTER — Other Ambulatory Visit: Payer: Medicare Other

## 2022-01-25 ENCOUNTER — Other Ambulatory Visit: Payer: Self-pay | Admitting: Family Medicine

## 2022-01-25 DIAGNOSIS — N3281 Overactive bladder: Secondary | ICD-10-CM | POA: Diagnosis not present

## 2022-01-25 DIAGNOSIS — R300 Dysuria: Secondary | ICD-10-CM | POA: Diagnosis not present

## 2022-01-25 DIAGNOSIS — Z23 Encounter for immunization: Secondary | ICD-10-CM | POA: Diagnosis not present

## 2022-01-25 DIAGNOSIS — E785 Hyperlipidemia, unspecified: Secondary | ICD-10-CM | POA: Diagnosis not present

## 2022-01-25 DIAGNOSIS — C52 Malignant neoplasm of vagina: Secondary | ICD-10-CM | POA: Diagnosis not present

## 2022-01-25 DIAGNOSIS — I10 Essential (primary) hypertension: Secondary | ICD-10-CM | POA: Diagnosis not present

## 2022-01-25 DIAGNOSIS — M8588 Other specified disorders of bone density and structure, other site: Secondary | ICD-10-CM | POA: Diagnosis not present

## 2022-01-25 LAB — CBC WITH DIFFERENTIAL/PLATELET
Basophils Absolute: 0.1 10*3/uL (ref 0.0–0.2)
Basos: 1 %
EOS (ABSOLUTE): 0.2 10*3/uL (ref 0.0–0.4)
Eos: 2 %
Hematocrit: 35 % (ref 34.0–46.6)
Hemoglobin: 11.8 g/dL (ref 11.1–15.9)
Immature Grans (Abs): 0 10*3/uL (ref 0.0–0.1)
Immature Granulocytes: 1 %
Lymphocytes Absolute: 2.9 10*3/uL (ref 0.7–3.1)
Lymphs: 33 %
MCH: 29.4 pg (ref 26.6–33.0)
MCHC: 33.7 g/dL (ref 31.5–35.7)
MCV: 87 fL (ref 79–97)
Monocytes Absolute: 0.4 10*3/uL (ref 0.1–0.9)
Monocytes: 5 %
Neutrophils Absolute: 5.2 10*3/uL (ref 1.4–7.0)
Neutrophils: 58 %
Platelets: 244 10*3/uL (ref 150–450)
RBC: 4.01 x10E6/uL (ref 3.77–5.28)
RDW: 13.5 % (ref 11.7–15.4)
WBC: 8.8 10*3/uL (ref 3.4–10.8)

## 2022-01-25 LAB — LIPID PANEL
Chol/HDL Ratio: 3 ratio (ref 0.0–4.4)
Cholesterol, Total: 182 mg/dL (ref 100–199)
HDL: 61 mg/dL (ref >39)
LDL Chol Calc (NIH): 96 mg/dL (ref 0–99)
Triglycerides: 147 mg/dL (ref 0–149)
VLDL Cholesterol Cal: 25 mg/dL (ref 5–40)

## 2022-01-25 LAB — CMP14+EGFR
ALT: 20 IU/L (ref 0–32)
AST: 21 IU/L (ref 0–40)
Albumin/Globulin Ratio: 1.8 (ref 1.2–2.2)
Albumin: 4.6 g/dL (ref 3.8–4.8)
Alkaline Phosphatase: 88 IU/L (ref 44–121)
BUN/Creatinine Ratio: 15 (ref 12–28)
BUN: 21 mg/dL (ref 8–27)
Bilirubin Total: 0.2 mg/dL (ref 0.0–1.2)
CO2: 22 mmol/L (ref 20–29)
Calcium: 10 mg/dL (ref 8.7–10.3)
Chloride: 102 mmol/L (ref 96–106)
Creatinine, Ser: 1.39 mg/dL — ABNORMAL HIGH (ref 0.57–1.00)
Globulin, Total: 2.6 g/dL (ref 1.5–4.5)
Glucose: 91 mg/dL (ref 70–99)
Potassium: 5 mmol/L (ref 3.5–5.2)
Sodium: 139 mmol/L (ref 134–144)
Total Protein: 7.2 g/dL (ref 6.0–8.5)
eGFR: 39 mL/min/{1.73_m2} — ABNORMAL LOW (ref >59)

## 2022-01-25 LAB — URINALYSIS
Bilirubin, UA: NEGATIVE
Glucose, UA: NEGATIVE
Ketones, UA: NEGATIVE
Nitrite, UA: NEGATIVE
Specific Gravity, UA: <1.005 — ABNORMAL LOW (ref 1.005–1.030)
Urobilinogen, Ur: 0.2 mg/dL (ref 0.2–1.0)
pH, UA: 5.5 (ref 5.0–7.5)

## 2022-01-25 MED ORDER — AMOXICILLIN 500 MG PO CAPS: 500.0000 mg | ORAL_CAPSULE | Freq: Three times a day (TID) | ORAL | 0 refills | Status: DC

## 2022-01-29 LAB — URINE CULTURE

## 2022-02-06 DIAGNOSIS — H401131 Primary open-angle glaucoma, bilateral, mild stage: Secondary | ICD-10-CM | POA: Diagnosis not present

## 2022-02-08 ENCOUNTER — Encounter: Payer: Self-pay | Admitting: *Deleted

## 2022-05-17 ENCOUNTER — Telehealth: Payer: Self-pay | Admitting: Nurse Practitioner

## 2022-05-17 NOTE — Telephone Encounter (Signed)
This requires an appointment

## 2022-05-17 NOTE — Telephone Encounter (Signed)
Called patient to schedule appt. Pt declined. Said she would try a home remedy first and if that didn't work then she would call back to make an appt.

## 2022-05-25 ENCOUNTER — Ambulatory Visit (INDEPENDENT_AMBULATORY_CARE_PROVIDER_SITE_OTHER): Payer: Medicare Other | Admitting: Nurse Practitioner

## 2022-05-25 ENCOUNTER — Encounter: Payer: Self-pay | Admitting: Nurse Practitioner

## 2022-05-25 VITALS — BP 124/77 | HR 95 | Temp 98.4°F | Resp 20 | Ht 62.0 in | Wt 138.0 lb

## 2022-05-25 DIAGNOSIS — R300 Dysuria: Secondary | ICD-10-CM

## 2022-05-25 DIAGNOSIS — N3000 Acute cystitis without hematuria: Secondary | ICD-10-CM | POA: Diagnosis not present

## 2022-05-25 LAB — URINALYSIS, COMPLETE
Bilirubin, UA: NEGATIVE
Glucose, UA: NEGATIVE
Ketones, UA: NEGATIVE
Nitrite, UA: NEGATIVE
RBC, UA: NEGATIVE
Specific Gravity, UA: 1.015 (ref 1.005–1.030)
Urobilinogen, Ur: 0.2 mg/dL (ref 0.2–1.0)
pH, UA: 7 (ref 5.0–7.5)

## 2022-05-25 LAB — MICROSCOPIC EXAMINATION
Epithelial Cells (non renal): NONE SEEN /hpf (ref 0–10)
RBC, Urine: >30 /hpf — AB (ref 0–2)
Renal Epithel, UA: NONE SEEN /hpf
WBC, UA: >30 /hpf — AB (ref 0–5)

## 2022-05-25 MED ORDER — SULFAMETHOXAZOLE-TRIMETHOPRIM 800-160 MG PO TABS: 1.0000 | ORAL_TABLET | Freq: Two times a day (BID) | ORAL | 0 refills | Status: DC

## 2022-05-25 NOTE — Patient Instructions (Signed)
Urinary Tract Infection, Adult A urinary tract infection (UTI) is an infection of any part of the urinary tract. The urinary tract includes: The kidneys. The ureters. The bladder. The urethra. These organs make, store, and get rid of pee (urine) in the body. What are the causes? This infection is caused by germs (bacteria) in your genital area. These germs grow and cause swelling (inflammation) of your urinary tract. What increases the risk? The following factors may make you more likely to develop this condition: Using a small, thin tube (catheter) to drain pee. Not being able to control when you pee or poop (incontinence). Being female. If you are female, these things can increase the risk: Using these methods to prevent pregnancy: A medicine that kills sperm (spermicide). A device that blocks sperm (diaphragm). Having low levels of a female hormone (estrogen). Being pregnant. You are more likely to develop this condition if: You have genes that add to your risk. You are sexually active. You take antibiotic medicines. You have trouble peeing because of: A prostate that is bigger than normal, if you are female. A blockage in the part of your body that drains pee from the bladder. A kidney stone. A nerve condition that affects your bladder. Not getting enough to drink. Not peeing often enough. You have other conditions, such as: Diabetes. A weak disease-fighting system (immune system). Sickle cell disease. Gout. Injury of the spine. What are the signs or symptoms? Symptoms of this condition include: Needing to pee right away. Peeing small amounts often. Pain or burning when peeing. Blood in the pee. Pee that smells bad or not like normal. Trouble peeing. Pee that is cloudy. Fluid coming from the vagina, if you are female. Pain in the belly or lower back. Other symptoms include: Vomiting. Not feeling hungry. Feeling mixed up (confused). This may be the first symptom in  older adults. Being tired and grouchy (irritable). A fever. Watery poop (diarrhea). How is this treated? Taking antibiotic medicine. Taking other medicines. Drinking enough water. In some cases, you may need to see a specialist. Follow these instructions at home:  Medicines Take over-the-counter and prescription medicines only as told by your doctor. If you were prescribed an antibiotic medicine, take it as told by your doctor. Do not stop taking it even if you start to feel better. General instructions Make sure you: Pee until your bladder is empty. Do not hold pee for a long time. Empty your bladder after sex. Wipe from front to back after peeing or pooping if you are a female. Use each tissue one time when you wipe. Drink enough fluid to keep your pee pale yellow. Keep all follow-up visits. Contact a doctor if: You do not get better after 1-2 days. Your symptoms go away and then come back. Get help right away if: You have very bad back pain. You have very bad pain in your lower belly. You have a fever. You have chills. You feeling like you will vomit or you vomit. Summary A urinary tract infection (UTI) is an infection of any part of the urinary tract. This condition is caused by germs in your genital area. There are many risk factors for a UTI. Treatment includes antibiotic medicines. Drink enough fluid to keep your pee pale yellow. This information is not intended to replace advice given to you by your health care provider. Make sure you discuss any questions you have with your health care provider. Document Revised: 10/31/2019 Document Reviewed: 10/31/2019 Elsevier Patient Education    2023 Elsevier Inc.  

## 2022-05-25 NOTE — Progress Notes (Signed)
   Subjective:    Patient ID: Sheri Russell, female    DOB: March 05, 1946, 77 y.o.   MRN: [id number]   Chief Complaint: Dysuria   Dysuria  This is a new problem. The current episode started in the past 7 days. The problem has been waxing and waning. The quality of the pain is described as aching and burning. The pain is at a severity of 5/10. The pain is mild. She is Not sexually active. There is No history of pyelonephritis. Associated symptoms include frequency, hesitancy and urgency. Pertinent negatives include no chills. She has tried increased fluids for the symptoms. The treatment provided mild relief.    Patient Active Problem List   Diagnosis Date Noted   Rosacea 07/25/2021   Osteopenia of lumbar spine 07/25/2021   Overactive bladder 01/20/2021   Allergic rhinitis 10/31/2013   Essential hypertension, benign 07/08/2012   Hyperlipidemia with target LDL less than 100 07/08/2012   Cancer of vagina ([place]) 01/28/2012       Review of Systems  Constitutional:  Negative for chills and fever.  Genitourinary:  Positive for dysuria, frequency, hesitancy and urgency.       Objective:   Physical Exam Constitutional:      Appearance: Normal appearance.  Cardiovascular:     Rate and Rhythm: Normal rate and regular rhythm.     Heart sounds: Normal heart sounds.  Pulmonary:     Effort: Pulmonary effort is normal.     Breath sounds: Normal breath sounds.  Abdominal:     Tenderness: There is no abdominal tenderness. There is no right CVA tenderness or left CVA tenderness.  Skin:    General: Skin is warm.  Neurological:     General: No focal deficit present.     Mental Status: She is alert and oriented to person, place, and time.  Psychiatric:        Mood and Affect: Mood normal.        Behavior: Behavior normal.     BP 124/77   Pulse 95   Temp 98.4 F (36.9 C) (Temporal)   Resp 20   Ht 5' 2"$  (1.575 m)   Wt 138 lb (62.6 kg)   LMP 07/09/1975   SpO2 96%   BMI 25.24 kg/m         Assessment & Plan:   [name] in today with chief complaint of Dysuria   1. Dysuria - Urinalysis, Complete - Urine Culture  2. Acute cystitis without hematuria Take medication as prescribe Cotton underwear Take shower not bath Cranberry juice, yogurt Force fluids AZO over the counter X2 days Culture pending RTO prn  Meds ordered this encounter  Medications   sulfamethoxazole-trimethoprim (BACTRIM DS) 800-160 MG tablet    Sig: Take 1 tablet by mouth 2 (two) times daily.    Dispense:  20 tablet    Refill:  0    Order Specific Question:   Supervising Provider    Answer:   Caryl Pina A [id number]      The above assessment and management plan was discussed with the patient. The patient verbalized understanding of and has agreed to the management plan. Patient is aware to call the clinic if symptoms persist or worsen. Patient is aware when to return to the clinic for a follow-up visit. Patient educated on when it is appropriate to go to the emergency department.   Mary-Margaret Hassell Done, FNP

## 2022-05-28 LAB — URINE CULTURE

## 2022-06-06 ENCOUNTER — Other Ambulatory Visit: Payer: Self-pay | Admitting: Nurse Practitioner

## 2022-06-29 ENCOUNTER — Other Ambulatory Visit: Payer: Self-pay

## 2022-06-29 ENCOUNTER — Encounter: Payer: Self-pay | Admitting: Nurse Practitioner

## 2022-06-29 ENCOUNTER — Telehealth (INDEPENDENT_AMBULATORY_CARE_PROVIDER_SITE_OTHER): Payer: Medicare Other | Admitting: Nurse Practitioner

## 2022-06-29 DIAGNOSIS — R399 Unspecified symptoms and signs involving the genitourinary system: Secondary | ICD-10-CM

## 2022-06-29 LAB — URINALYSIS, COMPLETE
Bilirubin, UA: NEGATIVE
Glucose, UA: NEGATIVE
Ketones, UA: NEGATIVE
Nitrite, UA: NEGATIVE
Specific Gravity, UA: 1.02 (ref 1.005–1.030)
Urobilinogen, Ur: 0.2 mg/dL (ref 0.2–1.0)
pH, UA: 7.5 (ref 5.0–7.5)

## 2022-06-29 LAB — MICROSCOPIC EXAMINATION
Epithelial Cells (non renal): NONE SEEN /hpf (ref 0–10)
RBC, Urine: >30 /hpf — AB (ref 0–2)
Renal Epithel, UA: NONE SEEN /hpf
WBC, UA: >30 /hpf — AB (ref 0–5)

## 2022-06-29 MED ORDER — CEPHALEXIN 500 MG PO CAPS: 500.0000 mg | ORAL_CAPSULE | Freq: Two times a day (BID) | ORAL | 0 refills | Status: DC

## 2022-06-29 NOTE — Addendum Note (Signed)
Addended by: Rolena Infante on: 06/29/2022 03:53 PM   Modules accepted: Orders

## 2022-06-29 NOTE — Progress Notes (Signed)
Virtual Visit Consent   Sheri Russell, you are scheduled for a virtual visit with Mary-Margaret Hassell Done, [place], a [facility] provider, today.     Just as with appointments in the office, your consent must be obtained to participate.  Your consent will be active for this visit and any virtual visit you may have with one of our providers in the next 365 days.     If you have a MyChart account, a copy of this consent can be sent to you electronically.  All virtual visits are billed to your insurance company just like a traditional visit in the office.    As this is a virtual visit, video technology does not allow for your provider to perform a traditional examination.  This may limit your provider's ability to fully assess your condition.  If your provider identifies any concerns that need to be evaluated in person or the need to arrange testing (such as labs, EKG, etc.), we will make arrangements to do so.     Although advances in technology are sophisticated, we cannot ensure that it will always work on either your end or our end.  If the connection with a video visit is poor, the visit may have to be switched to a telephone visit.  With either a video or telephone visit, we are not always able to ensure that we have a secure connection.     I need to obtain your verbal consent now.   Are you willing to proceed with your visit today? YES   Brantley Dockter has provided verbal consent on 06/29/2022 for a virtual visit (video or telephone).   Mary-Margaret Hassell Done, FNP   Date: 06/29/2022 11:31 AM   Virtual Visit via Video Note   I, Mary-Margaret Hassell Done, connected with Sheri Russell ([id number], 1945/10/22) on 06/29/22 at 11:15 AM EDT by a video-enabled telemedicine application and verified that I am speaking with the correct person using two identifiers.  Location: Patient: Virtual Visit Location Patient: Home Provider: Virtual Visit Location Provider: Mobile   I discussed the limitations of  evaluation and management by telemedicine and the availability of in person appointments. The patient expressed understanding and agreed to proceed.    History of Present Illness: Sheri Russell is a 77 y.o. who identifies as a female who was assigned female at birth, and is being seen today for UTI symptoms.  HPI: Urinary Tract Infection  This is a new problem. The current episode started in the past 7 days. The problem occurs every urination. The problem has been waxing and waning. The quality of the pain is described as burning. The pain is at a severity of 3/10. The pain is mild. There has been no fever. She is Not sexually active. There is No history of pyelonephritis. Associated symptoms include frequency, hesitancy and urgency. Pertinent negatives include no discharge or flank pain. She has tried nothing for the symptoms. The treatment provided no relief.    Review of Systems  Genitourinary:  Positive for frequency, hesitancy and urgency. Negative for flank pain.    Problems:  Patient Active Problem List   Diagnosis Date Noted   Rosacea 07/25/2021   Osteopenia of lumbar spine 07/25/2021   Overactive bladder 01/20/2021   Allergic rhinitis 10/31/2013   Essential hypertension, benign 07/08/2012   Hyperlipidemia with target LDL less than 100 07/08/2012   Cancer of vagina ([place]) 01/28/2012    Allergies:  Allergies  Allergen Reactions   Fish Allergy    Iohexol  IVP Dye    Iodinated Contrast Media Rash   Other Hives and Rash    Seafood perch, red snapper any seafood with iodine   Medications:  Current Outpatient Medications:    enalapril (VASOTEC) 20 MG tablet, Take 1 tablet (20 mg total) by mouth 2 (two) times daily., Disp: 180 tablet, Rfl: 1   ipratropium (ATROVENT) 0.06 % nasal spray, 2 sprays into each nostril Three (3) times a day., Disp: , Rfl:    meclizine (ANTIVERT) 25 MG tablet, TAKE 1 TABLET BY MOUTH THREE TIMES DAILY AS NEEDED FOR DIZZINESS, Disp: 30 tablet, Rfl: 2    metroNIDAZOLE (METROGEL) 1 % gel, Apply topically daily., Disp: 45 g, Rfl: 1   Omega-3 Fatty Acids (FISH OIL) 1000 MG CPDR, Take 1-2 capsules by mouth daily., Disp: , Rfl:    ondansetron (ZOFRAN) 4 MG tablet, TAKE 1 TABLET BY MOUTH EVERY 8 HOURS AS NEEDED FOR NAUSEA AND VOMITING, Disp: 20 tablet, Rfl: 2   oxybutynin (DITROPAN) 5 MG tablet, Take 1 tablet (5 mg total) by mouth at bedtime., Disp: 90 tablet, Rfl: 1   sulfamethoxazole-trimethoprim (BACTRIM DS) 800-160 MG tablet, Take 1 tablet by mouth 2 (two) times daily., Disp: 20 tablet, Rfl: 0   VENTOLIN HFA 108 (90 Base) MCG/ACT inhaler, INHALE 2 PUFFS BY MOUTH EVERY 6 HOURS AS NEEDED FOR WHEEZING AND FOR SHORTNESS OF BREATH, Disp: 18 g, Rfl: 0  Observations/Objective: Patient is well-developed, well-nourished in no acute distress.  Resting comfortably  at home.  Head is normocephalic, atraumatic.  No labored breathing.  Speech is clear and coherent with logical content.  Patient is alert and oriented at baseline.  No back pain  Assessment and Plan:  Sheri Russell in today with chief complaint of Urinary Tract Infection   1. UTI symptoms Take medication as prescribe Cotton underwear Take shower not bath Cranberry juice, yogurt Force fluids AZO over the counter X2 days RTO prn  - cephALEXin (KEFLEX) 500 MG capsule; Take 1 capsule (500 mg total) by mouth 2 (two) times daily.  Dispense: 14 capsule; Refill: 0     Follow Up Instructions: I discussed the assessment and treatment plan with the patient. The patient was provided an opportunity to ask questions and all were answered. The patient agreed with the plan and demonstrated an understanding of the instructions.  A copy of instructions were sent to the patient via MyChart.  The patient was advised to call back or seek an in-person evaluation if the symptoms worsen or if the condition fails to improve as anticipated.  Time:  I spent 10 minutes with the patient via telehealth  technology discussing the above problems/concerns.    Mary-Margaret Hassell Done, FNP

## 2022-06-29 NOTE — Patient Instructions (Signed)
Sheri Russell, thank you for joining [name], FNP for today's virtual visit.  While this provider is not your primary care provider (PCP), if your PCP is located in our provider database this encounter information will be shared with them immediately following your visit.   [facility] account gives you access to today's visit and all your visits, tests, and labs performed at [facility] " click here if you don't have a [facility] account or go to [email or web address]  Consent: (Patient) Sheri Russell provided verbal consent for this virtual visit at the beginning of the encounter.  Current Medications:  Current Outpatient Medications:    cephALEXin (KEFLEX) 500 MG capsule, Take 1 capsule (500 mg total) by mouth 2 (two) times daily., Disp: 14 capsule, Rfl: 0   enalapril (VASOTEC) 20 MG tablet, Take 1 tablet (20 mg total) by mouth 2 (two) times daily., Disp: 180 tablet, Rfl: 1   ipratropium (ATROVENT) 0.06 % nasal spray, 2 sprays into each nostril Three (3) times a day., Disp: , Rfl:    meclizine (ANTIVERT) 25 MG tablet, TAKE 1 TABLET BY MOUTH THREE TIMES DAILY AS NEEDED FOR DIZZINESS, Disp: 30 tablet, Rfl: 2   metroNIDAZOLE (METROGEL) 1 % gel, Apply topically daily., Disp: 45 g, Rfl: 1   Omega-3 Fatty Acids (FISH OIL) 1000 MG CPDR, Take 1-2 capsules by mouth daily., Disp: , Rfl:    ondansetron (ZOFRAN) 4 MG tablet, TAKE 1 TABLET BY MOUTH EVERY 8 HOURS AS NEEDED FOR NAUSEA AND VOMITING, Disp: 20 tablet, Rfl: 2   oxybutynin (DITROPAN) 5 MG tablet, Take 1 tablet (5 mg total) by mouth at bedtime., Disp: 90 tablet, Rfl: 1   sulfamethoxazole-trimethoprim (BACTRIM DS) 800-160 MG tablet, Take 1 tablet by mouth 2 (two) times daily., Disp: 20 tablet, Rfl: 0   VENTOLIN HFA 108 (90 Base) MCG/ACT inhaler, INHALE 2 PUFFS BY MOUTH EVERY 6 HOURS AS NEEDED FOR WHEEZING AND FOR SHORTNESS OF BREATH, Disp: 18 g, Rfl: 0   Medications ordered in this encounter:  Meds  ordered this encounter  Medications   cephALEXin (KEFLEX) 500 MG capsule    Sig: Take 1 capsule (500 mg total) by mouth 2 (two) times daily.    Dispense:  14 capsule    Refill:  0    Order Specific Question:   Supervising Provider    Answer:   Caryl Pina A [id number]     *If you need refills on other medications prior to your next appointment, please contact your pharmacy*  Follow-Up: Call back or seek an in-person evaluation if the symptoms worsen or if the condition fails to improve as anticipated.  [place]  Other Instructions Take medication as prescribe Cotton underwear Take shower not bath Cranberry juice, yogurt Force fluids AZO over the counter X2 days RTO prn    If you have been instructed to have an in-person evaluation today at a local Urgent Care facility, please use the link below. It will take you to a list of all of our available [facility], including address, phone number and hours of operation. Please do not delay care.  [facility]  If you or a family member do not have a primary care provider, use the link below to schedule a visit and establish care. When you choose a [facility] primary care physician or advanced practice provider, you gain a long-term partner in health. Find a Primary Care Provider  Learn more about [facility]'s in-office and virtual care options: [facility] [facility]

## 2022-07-02 LAB — URINE CULTURE

## 2022-07-12 DIAGNOSIS — K631 Perforation of intestine (nontraumatic): Secondary | ICD-10-CM | POA: Diagnosis not present

## 2022-07-12 DIAGNOSIS — Z932 Ileostomy status: Secondary | ICD-10-CM | POA: Diagnosis not present

## 2022-07-12 DIAGNOSIS — Z933 Colostomy status: Secondary | ICD-10-CM | POA: Diagnosis not present

## 2022-07-15 ENCOUNTER — Other Ambulatory Visit: Payer: Self-pay | Admitting: Nurse Practitioner

## 2022-07-15 DIAGNOSIS — N3281 Overactive bladder: Secondary | ICD-10-CM

## 2022-07-15 DIAGNOSIS — I10 Essential (primary) hypertension: Secondary | ICD-10-CM

## 2022-07-17 DIAGNOSIS — J0100 Acute maxillary sinusitis, unspecified: Secondary | ICD-10-CM | POA: Diagnosis not present

## 2022-07-17 DIAGNOSIS — N3001 Acute cystitis with hematuria: Secondary | ICD-10-CM | POA: Diagnosis not present

## 2022-07-17 DIAGNOSIS — R109 Unspecified abdominal pain: Secondary | ICD-10-CM | POA: Diagnosis not present

## 2022-07-27 ENCOUNTER — Ambulatory Visit: Payer: Medicare Other | Admitting: Nurse Practitioner

## 2022-08-01 ENCOUNTER — Encounter: Payer: Self-pay | Admitting: Nurse Practitioner

## 2022-08-01 ENCOUNTER — Ambulatory Visit (INDEPENDENT_AMBULATORY_CARE_PROVIDER_SITE_OTHER): Payer: HMO | Admitting: Nurse Practitioner

## 2022-08-01 VITALS — BP 136/78 | HR 93 | Temp 97.6°F | Resp 20 | Ht 62.0 in | Wt 138.0 lb

## 2022-08-01 DIAGNOSIS — E785 Hyperlipidemia, unspecified: Secondary | ICD-10-CM | POA: Diagnosis not present

## 2022-08-01 DIAGNOSIS — I10 Essential (primary) hypertension: Secondary | ICD-10-CM

## 2022-08-01 DIAGNOSIS — N3281 Overactive bladder: Secondary | ICD-10-CM | POA: Diagnosis not present

## 2022-08-01 DIAGNOSIS — M8588 Other specified disorders of bone density and structure, other site: Secondary | ICD-10-CM | POA: Diagnosis not present

## 2022-08-01 MED ORDER — OXYBUTYNIN CHLORIDE 5 MG PO TABS: 5.0000 mg | ORAL_TABLET | Freq: Every day | ORAL | 1 refills | Status: DC

## 2022-08-01 MED ORDER — ENALAPRIL MALEATE 20 MG PO TABS: 20.0000 mg | ORAL_TABLET | Freq: Two times a day (BID) | ORAL | 1 refills | Status: DC

## 2022-08-01 NOTE — Progress Notes (Signed)
Subjective:    Patient ID: Sheri Russell, female    DOB: 1945-10-03, 77 y.o.   MRN: [id number]   Chief Complaint: medical management of chronic issues     HPI:  Sheri Russell is a 77 y.o. who identifies as a female who was assigned female at birth.   Social history: Lives with: by herself Work history: retired   Water engineer in today for follow up of the following chronic medical issues:  1. Essential hypertension, benign No c/o chest pain, sob or headache. Does not check blood pressure at home. BP Readings from Last 3 Encounters:  05/25/22 124/77  01/24/22 114/70  10/28/21 (!) 150/86     2. Hyperlipidemia with target LDL less than 100 Does try to watch diet and stays very active Lab Results  Component Value Date   CHOL 182 01/24/2022   HDL 61 01/24/2022   LDLCALC 96 01/24/2022   TRIG 147 01/24/2022   CHOLHDL 3.0 01/24/2022   The 10-year ASCVD risk score (Arnett DK, et al., 2019) is: 32.3%   3. Osteopenia of lumbar spine Last dexascan was done on 07/25/21   New complaints: None today  Allergies  Allergen Reactions   Fish Allergy    Iohexol     IVP Dye    Iodinated Contrast Media Rash   Other Hives and Rash    Seafood perch, red snapper any seafood with iodine   Outpatient Encounter Medications as of 08/01/2022  Medication Sig   cephALEXin (KEFLEX) 500 MG capsule Take 1 capsule (500 mg total) by mouth 2 (two) times daily.   enalapril (VASOTEC) 20 MG tablet Take 1 tablet by mouth twice daily   ipratropium (ATROVENT) 0.06 % nasal spray 2 sprays into each nostril Three (3) times a day.   meclizine (ANTIVERT) 25 MG tablet TAKE 1 TABLET BY MOUTH THREE TIMES DAILY AS NEEDED FOR DIZZINESS   metroNIDAZOLE (METROGEL) 1 % gel Apply topically daily.   Omega-3 Fatty Acids (FISH OIL) 1000 MG CPDR Take 1-2 capsules by mouth daily.   ondansetron (ZOFRAN) 4 MG tablet TAKE 1 TABLET BY MOUTH EVERY 8 HOURS AS NEEDED FOR NAUSEA AND VOMITING   oxybutynin (DITROPAN) 5 MG tablet TAKE  1 TABLET BY MOUTH AT BEDTIME   sulfamethoxazole-trimethoprim (BACTRIM DS) 800-160 MG tablet Take 1 tablet by mouth 2 (two) times daily.   VENTOLIN HFA 108 (90 Base) MCG/ACT inhaler INHALE 2 PUFFS BY MOUTH EVERY 6 HOURS AS NEEDED FOR WHEEZING AND FOR SHORTNESS OF BREATH   No facility-administered encounter medications on file as of 08/01/2022.    Past Surgical History:  Procedure Laterality Date   APPENDECTOMY  1986   CHOLECYSTECTOMY  1985   colostomy bag     Several cancer surgeries  1971, 1985, & 1986   TOTAL ABDOMINAL HYSTERECTOMY  1986    Family History  Problem Relation Age of Onset   Diabetes Mother    Hypertension Mother    Heart attack Mother    Heart disease Mother    Diabetes Father    Hypertension Father    Heart attack Father    Heart disease Father    Diabetes Maternal Grandmother    Hypertension Maternal Grandmother    Heart attack Maternal Grandmother    Diabetes Maternal Grandfather    Hypertension Maternal Grandfather    Heart attack Maternal Grandfather    Diabetes Paternal Grandmother    Hypertension Paternal Grandmother    Heart attack Paternal Grandmother    Diabetes Paternal Actor  Hypertension Paternal Grandfather    Heart attack Paternal Grandfather    Stroke Brother    Hypertension Brother    Asthma Brother    Liver cancer Brother    Alcohol abuse Brother    Cancer Sister        colon cancer    Diabetes Sister    Diabetes Sister    Diabetes Sister    Hypertension Brother    Diabetes Brother       Controlled substance contract: n/a     Review of Systems  Constitutional:  Negative for diaphoresis.  Eyes:  Negative for pain.  Respiratory:  Negative for shortness of breath.   Cardiovascular:  Negative for chest pain, palpitations and leg swelling.  Gastrointestinal:  Negative for abdominal pain.  Endocrine: Negative for polydipsia.  Skin:  Negative for rash.  Neurological:  Negative for dizziness, weakness and headaches.   Hematological:  Does not bruise/bleed easily.  All other systems reviewed and are negative.      Objective:   Physical Exam Vitals and nursing note reviewed.  Constitutional:      General: She is not in acute distress.    Appearance: Normal appearance. She is well-developed.  HENT:     Head: Normocephalic.     Right Ear: Tympanic membrane normal.     Left Ear: Tympanic membrane normal.     Nose: Nose normal.     Mouth/Throat:     Mouth: Mucous membranes are moist.  Eyes:     Pupils: Pupils are equal, round, and reactive to light.  Neck:     Vascular: No carotid bruit or JVD.  Cardiovascular:     Rate and Rhythm: Normal rate and regular rhythm.     Heart sounds: Normal heart sounds.  Pulmonary:     Effort: Pulmonary effort is normal. No respiratory distress.     Breath sounds: Normal breath sounds. No wheezing or rales.  Chest:     Chest wall: No tenderness.  Abdominal:     General: Bowel sounds are normal. There is no distension or abdominal bruit.     Palpations: Abdomen is soft. There is no hepatomegaly, splenomegaly, mass or pulsatile mass.     Tenderness: There is no abdominal tenderness.  Musculoskeletal:        General: Normal range of motion.     Cervical back: Normal range of motion and neck supple.  Lymphadenopathy:     Cervical: No cervical adenopathy.  Skin:    General: Skin is warm and dry.  Neurological:     Mental Status: She is alert and oriented to person, place, and time.     Deep Tendon Reflexes: Reflexes are normal and symmetric.  Psychiatric:        Behavior: Behavior normal.        Thought Content: Thought content normal.        Judgment: Judgment normal.    BP 136/78   Pulse 93   Temp 97.6 F (36.4 C) (Temporal)   Resp 20   Ht 5\' 2"  (1.575 m)   Wt 138 lb (62.6 kg)   LMP 07/09/1975   SpO2 97%   BMI 25.24 kg/m          Assessment & Plan:   Sheri Russell comes in today with chief complaint of Medical Management of Chronic  Issues   Diagnosis and orders addressed:  1. Essential hypertension, benign Low sodium diet - enalapril (VASOTEC) 20 MG tablet; Take 1 tablet (20 mg total)  by mouth 2 (two) times daily.  Dispense: 180 tablet; Refill: 1 - CBC with Differential/Platelet - CMP14+EGFR  2. Hyperlipidemia with target LDL less than 100 Low fat diet - Lipid panel  3. Osteopenia of lumbar spine Weight bearing exercise  4. Overactive bladder - oxybutynin (DITROPAN) 5 MG tablet; Take 1 tablet (5 mg total) by mouth at bedtime.  Dispense: 90 tablet; Refill: 1   Labs pending Health Maintenance reviewed Diet and exercise encouraged  Follow up plan: 6 months   Mary-Margaret Daphine Deutscher, FNP

## 2022-08-01 NOTE — Patient Instructions (Signed)

## 2022-08-02 LAB — LIPID PANEL
Chol/HDL Ratio: 2.7 ratio (ref 0.0–4.4)
Cholesterol, Total: 164 mg/dL (ref 100–199)
HDL: 60 mg/dL (ref >39)
LDL Chol Calc (NIH): 79 mg/dL (ref 0–99)
Triglycerides: 147 mg/dL (ref 0–149)
VLDL Cholesterol Cal: 25 mg/dL (ref 5–40)

## 2022-08-02 LAB — CMP14+EGFR
ALT: 27 IU/L (ref 0–32)
AST: 27 IU/L (ref 0–40)
Albumin/Globulin Ratio: 1.6 (ref 1.2–2.2)
Albumin: 4.2 g/dL (ref 3.8–4.8)
Alkaline Phosphatase: 91 IU/L (ref 44–121)
BUN/Creatinine Ratio: 14 (ref 12–28)
BUN: 17 mg/dL (ref 8–27)
Bilirubin Total: 0.2 mg/dL (ref 0.0–1.2)
CO2: 20 mmol/L (ref 20–29)
Calcium: 9.4 mg/dL (ref 8.7–10.3)
Chloride: 105 mmol/L (ref 96–106)
Creatinine, Ser: 1.22 mg/dL — ABNORMAL HIGH (ref 0.57–1.00)
Globulin, Total: 2.6 g/dL (ref 1.5–4.5)
Glucose: 86 mg/dL (ref 70–99)
Potassium: 4.8 mmol/L (ref 3.5–5.2)
Sodium: 141 mmol/L (ref 134–144)
Total Protein: 6.8 g/dL (ref 6.0–8.5)
eGFR: 46 mL/min/{1.73_m2} — ABNORMAL LOW (ref >59)

## 2022-08-02 LAB — CBC WITH DIFFERENTIAL/PLATELET
Basophils Absolute: 0.1 10*3/uL (ref 0.0–0.2)
Basos: 1 %
EOS (ABSOLUTE): 0.2 10*3/uL (ref 0.0–0.4)
Eos: 2 %
Hematocrit: 34.2 % (ref 34.0–46.6)
Hemoglobin: 10.8 g/dL — ABNORMAL LOW (ref 11.1–15.9)
Immature Grans (Abs): 0 10*3/uL (ref 0.0–0.1)
Immature Granulocytes: 1 %
Lymphocytes Absolute: 2.4 10*3/uL (ref 0.7–3.1)
Lymphs: 29 %
MCH: 27.1 pg (ref 26.6–33.0)
MCHC: 31.6 g/dL (ref 31.5–35.7)
MCV: 86 fL (ref 79–97)
Monocytes Absolute: 0.6 10*3/uL (ref 0.1–0.9)
Monocytes: 8 %
Neutrophils Absolute: 5 10*3/uL (ref 1.4–7.0)
Neutrophils: 59 %
Platelets: 233 10*3/uL (ref 150–450)
RBC: 3.99 x10E6/uL (ref 3.77–5.28)
RDW: 13 % (ref 11.7–15.4)
WBC: 8.3 10*3/uL (ref 3.4–10.8)

## 2022-08-07 ENCOUNTER — Ambulatory Visit (INDEPENDENT_AMBULATORY_CARE_PROVIDER_SITE_OTHER): Payer: HMO

## 2022-08-07 VITALS — Ht 65.0 in | Wt 138.0 lb

## 2022-08-07 DIAGNOSIS — Z0000 Encounter for general adult medical examination without abnormal findings: Secondary | ICD-10-CM | POA: Diagnosis not present

## 2022-08-07 NOTE — Progress Notes (Signed)
Subjective:   Sheri Russell is a 77 y.o. female who presents for Medicare Annual (Subsequent) preventive examination. I connected with  Sheri Russell on 08/07/22 by a audio enabled telemedicine application and verified that I am speaking with the correct person using two identifiers.  Patient Location: Home  Provider Location: Home Office  I discussed the limitations of evaluation and management by telemedicine. The patient expressed understanding and agreed to proceed.  Review of Systems     Cardiac Risk Factors include: advanced age (>18men, >65 women)     Objective:    Today's Vitals   08/07/22 1028  Weight: 138 lb (62.6 kg)  Height: 5\' 5"  (1.651 m)   Body mass index is 22.96 kg/m.     08/07/2022   10:31 AM 08/01/2021   10:37 AM 07/23/2019    9:39 AM 01/14/2018    2:48 PM 01/08/2017    3:17 PM 01/08/2017    2:38 PM 03/13/2014    2:45 PM  Advanced Directives  Does Patient Have a Medical Advance Directive? Yes Yes Yes Yes  No Yes  Type of Estate agent of [place];Living will Healthcare Power of [place];Living will Healthcare Power of [place];Living will Living will;Healthcare Power of [employer] Power of [place];Living will    Does patient want to make changes to medical advance directive?   No - Patient declined Yes (MAU/Ambulatory/Procedural Areas - Information given)     Copy of Healthcare Power of Attorney in Chart? No - copy requested No - copy requested No - copy requested No - copy requested No - copy requested  No - copy requested    Current Medications (verified) Outpatient Encounter Medications as of 08/07/2022  Medication Sig   enalapril (VASOTEC) 20 MG tablet Take 1 tablet (20 mg total) by mouth 2 (two) times daily.   meclizine (ANTIVERT) 25 MG tablet TAKE 1 TABLET BY MOUTH THREE TIMES DAILY AS NEEDED FOR DIZZINESS   ondansetron (ZOFRAN) 4 MG tablet TAKE 1 TABLET BY MOUTH EVERY 8 HOURS AS NEEDED FOR NAUSEA AND VOMITING   oxybutynin  (DITROPAN) 5 MG tablet Take 1 tablet (5 mg total) by mouth at bedtime.   VENTOLIN HFA 108 (90 Base) MCG/ACT inhaler INHALE 2 PUFFS BY MOUTH EVERY 6 HOURS AS NEEDED FOR WHEEZING AND FOR SHORTNESS OF BREATH   ipratropium (ATROVENT) 0.06 % nasal spray 2 sprays into each nostril Three (3) times a Sheri.   No facility-administered encounter medications on file as of 08/07/2022.    Allergies (verified) Fish allergy, Iohexol, Iodinated contrast media, and Other   History: Past Medical History:  Diagnosis Date   Allergic rhinitis    Anxiety    Asthma    Bladder cancer (HCC) 1986   Cancer [facility]) 1971   pelvic - oncologist -Dr. Carlean Jews [facility] )   Cervical cancer [facility])    Colon polyps    Hyperlipidemia    Hypertension    Past Surgical History:  Procedure Laterality Date   APPENDECTOMY  1986   CHOLECYSTECTOMY  1985   colostomy bag     Several cancer surgeries  1971, 1985, & 1986   TOTAL ABDOMINAL HYSTERECTOMY  1986   Family History  Problem Relation Age of Onset   Diabetes Mother    Hypertension Mother    Heart attack Mother    Heart disease Mother    Diabetes Father    Hypertension Father    Heart attack Father    Heart disease Father  Diabetes Maternal Grandmother    Hypertension Maternal Grandmother    Heart attack Maternal Grandmother    Diabetes Maternal Grandfather    Hypertension Maternal Grandfather    Heart attack Maternal Grandfather    Diabetes Paternal Grandmother    Hypertension Paternal Grandmother    Heart attack Paternal Grandmother    Diabetes Paternal Grandfather    Hypertension Paternal Grandfather    Heart attack Paternal Grandfather    Stroke Brother    Hypertension Brother    Asthma Brother    Liver cancer Brother    Alcohol abuse Brother    Cancer Sister        colon cancer    Diabetes Sister    Diabetes Sister    Diabetes Sister    Hypertension Brother    Diabetes Brother    Social History   Socioeconomic History   Marital  status: Widowed    Spouse name: Not on file   Number of children: 1   Years of education: 8   Highest education level: 8th grade  Occupational History   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  Tobacco Use   Smoking status: Former    Types: Cigarettes    Quit date: 1978    Years since quitting: 46.3   Smokeless tobacco: Never  Vaping Use   Vaping Use: Never used  Substance and Sexual Activity   Alcohol use: Yes    Alcohol/week: 1.0 standard drink of alcohol    Types: 1 Glasses of wine per week    Comment: occasionally    Drug use: No   Sexual activity: Not Currently  Other Topics Concern   Not on file  Social History Narrative   Lives alone    Daughter in [place]   1 grandchild    Social Determinants of Health   Financial Resource Strain: Low Risk  (08/07/2022)   Overall Financial Resource Strain (CARDIA)    Difficulty of Paying Living Expenses: Not hard at all  Food Insecurity: No Food Insecurity (08/07/2022)   Hunger Vital Sign    Worried About Running Out of Food in the Last Year: Never true    Ran Out of Food in the Last Year: Never true  Transportation Needs: No Transportation Needs (08/07/2022)   PRAPARE - Administrator, Civil Service (Medical): No    Lack of Transportation (Non-Medical): No  Physical Activity: Sufficiently Active (08/07/2022)   Exercise Vital Sign    Days of Exercise per Week: 5 days    Minutes of Exercise per Session: 60 min  Stress: No Stress Concern Present (08/07/2022)   [employer] of Occupational Health - Occupational Stress Questionnaire    Feeling of Stress : Not at all  Social Connections: Moderately Isolated (08/07/2022)   Social Connection and Isolation Panel [NHANES]    Frequency of Communication with Friends and Family: More than three times a week    Frequency of Social Gatherings with Friends and Family: More than three times a week    Attends Religious Services: More than 4 times per year     Active Member of [employer] or Organizations: No    Attends Banker Meetings: Never    Marital Status: Widowed    Tobacco Counseling Counseling given: Not Answered   Clinical Intake:  Pre-visit preparation completed: Yes  Pain : No/denies pain     Nutritional Risks: None Diabetes: No  How often do you need to have someone help  you when you read instructions, pamphlets, or other written materials from your doctor or pharmacy?: 1 - Never  Diabetic?no   Interpreter Needed?: No  Information entered by :: Renie Ora, LPN   Activities of Daily Living    08/07/2022   10:31 AM  In your present state of health, do you have any difficulty performing the following activities:  Hearing? 0  Vision? 0  Difficulty concentrating or making decisions? 0  Walking or climbing stairs? 0  Dressing or bathing? 0  Doing errands, shopping? 0  Preparing Food and eating ? N  Using the Toilet? N  In the past six months, have you accidently leaked urine? N  Do you have problems with loss of bowel control? N  Managing your Medications? N  Managing your Finances? N  Housekeeping or managing your Housekeeping? N    Patient Care Team: Bennie Pierini, FNP as PCP - General (Nurse Practitioner) Desiree Lucy, OD (Optometry) Randa Spike, Kelton Pillar, LCSW as Social Worker (Licensed Clinical Social Worker) Barry Brunner, NP as Nurse Practitioner (Oncology) Mindi Junker, MD (Obstetrics)  Indicate any recent Medical Services you may have received from other than [facility] providers in the past year (date may be approximate).     Assessment:   This is a routine wellness examination for Sheri Russell.  Hearing/Vision screen Vision Screening - Comments:: Wears rx glasses - up to date with routine eye exams with  Dr.Davis   Dietary issues and exercise activities discussed: Current Exercise Habits: Home exercise routine, Type of exercise: walking, Time (Minutes): 60, Frequency  (Times/Week): 5, Weekly Exercise (Minutes/Week): 300, Intensity: Mild, Exercise limited by: None identified   Goals Addressed             This Visit's Progress    Prevent falls   On track      Depression Screen    08/07/2022   10:30 AM 08/01/2022    2:11 PM 05/25/2022    2:53 PM 01/24/2022    2:01 PM 10/24/2021    9:08 AM 08/01/2021   10:34 AM 07/25/2021    2:00 PM  PHQ 2/9 Scores  PHQ - 2 Score 0 0 0 0 0 1 0  PHQ- 9 Score 0 0 0 0  1 1    Fall Risk    08/07/2022   10:29 AM 08/01/2022    2:11 PM 05/25/2022    2:53 PM 01/24/2022    2:06 PM 01/24/2022    2:01 PM  Fall Risk   Falls in the past year? 0 0 0 0 0  Number falls in past yr: 0      Injury with Fall? 0      Risk for fall due to : No Fall Risks      Follow up Falls prevention discussed        FALL RISK PREVENTION PERTAINING TO THE HOME:  Any stairs in or around the home? No  If so, are there any without handrails? No  Home free of loose throw rugs in walkways, pet beds, electrical cords, etc? Yes  Adequate lighting in your home to reduce risk of falls? Yes   ASSISTIVE DEVICES UTILIZED TO PREVENT FALLS:  Life alert? No  Use of a cane, walker or w/c? No  Grab bars in the bathroom? Yes  Shower chair or bench in shower? Yes  Elevated toilet seat or a handicapped toilet? No       01/14/2018    2:49 PM 01/08/2017    3:21 PM  MMSE - Mini Mental State Exam  Orientation to time 5 5  Orientation to Place 5 5  Registration 3 3  Attention/ Calculation 5 5  Recall 2 3  Language- name 2 objects 2 2  Language- repeat 1 1  Language- follow 3 step command 3 3  Language- read & follow direction 1 1  Write a sentence 1 1  Copy design 1 1  Total score 29 30        08/07/2022   10:31 AM 08/01/2021   10:43 AM 07/23/2020    9:29 AM 07/23/2019    9:41 AM  6CIT Screen  What Year? 0 points 0 points 0 points 0 points  What month? 0 points 0 points 0 points 0 points  What time? 0 points 0 points 0 points 0 points  Count  back from 20 0 points 0 points 0 points 0 points  Months in reverse 0 points 2 points 0 points 0 points  Repeat phrase 0 points 0 points 2 points 0 points  Total Score 0 points 2 points 2 points 0 points    Immunizations Immunization History  Administered Date(s) Administered   Fluad Quad(high Dose 65+) 01/19/2020, 01/20/2021, 01/25/2022   Influenza, High Dose Seasonal PF 01/31/2017, 01/14/2018, 12/13/2018   Influenza, Seasonal, Injecte, Preservative Fre 01/18/2010   Influenza,inj,Quad PF,6+ Mos 01/20/2013, 03/20/2016   Moderna SARS-COV2 Booster Vaccination 03/03/2020   Moderna Sars-Covid-2 Vaccination 05/29/2019, 06/27/2019   Pneumococcal Conjugate-13 07/29/2014   Pneumococcal Polysaccharide-23 04/18/2010, 03/20/2016   Td 11/07/2000   Tdap 05/26/2010, 07/23/2014    TDAP status: Up to date  Flu Vaccine status: Up to date  Pneumococcal vaccine status: Up to date  Covid-19 vaccine status: Completed vaccines  Qualifies for Shingles Vaccine? Yes   Zostavax completed No   Shingrix Completed?: No.    Education has been provided regarding the importance of this vaccine. Patient has been advised to call insurance company to determine out of pocket expense if they have not yet received this vaccine. Advised may also receive vaccine at local pharmacy or Health Dept. Verbalized acceptance and understanding.  Screening Tests Health Maintenance  Topic Date Due   COVID-19 Vaccine (3 - Moderna risk series) 08/17/2022 (Originally 03/31/2020)   Zoster Vaccines- Shingrix (1 of 2) 08/23/2022 (Originally 06/06/1964)   MAMMOGRAM  01/25/2023 (Originally 10/27/2021)   INFLUENZA VACCINE  11/02/2022   DEXA SCAN  07/26/2023   Medicare Annual Wellness (AWV)  08/07/2023   DTaP/Tdap/Td (4 - Td or Tdap) 07/22/2024   Pneumonia Vaccine 81+ Years old  Completed   Hepatitis C Screening  Completed   HPV VACCINES  Aged Out    Health Maintenance  There are no preventive care reminders to display for this  patient.   Colorectal cancer screening: No longer required.   Mammogram status: No longer required due to age.  Bone Density status: Completed 08/07/2022. Results reflect: Bone density results: OSTEOPOROSIS. Repeat every 2 years.  Lung Cancer Screening: (Low Dose CT Chest recommended if Age 73-80 years, 30 pack-year currently smoking OR have quit w/in 15years.) does not qualify.   Lung Cancer Screening Referral: n/a  Additional Screening:  Hepatitis C Screening: does not qualify;   Vision Screening: Recommended annual ophthalmology exams for early detection of glaucoma and other disorders of the eye. Is the patient up to date with their annual eye exam?  Yes  Who is the provider or what is the name of the office in which the patient attends annual eye exams? Dr.Davis  If pt is not established with a provider, would they like to be referred to a provider to establish care? No .   Dental Screening: Recommended annual dental exams for proper oral hygiene  Community Resource Referral / Chronic Care Management: CRR required this visit?  No   CCM required this visit?  No      Plan:     I have personally reviewed and noted the following in the patient's chart:   Medical and social history Use of alcohol, tobacco or illicit drugs  Current medications and supplements including opioid prescriptions. Patient is not currently taking opioid prescriptions. Functional ability and status Nutritional status Physical activity Advanced directives List of other physicians Hospitalizations, surgeries, and ER visits in previous 12 months Vitals Screenings to include cognitive, depression, and falls Referrals and appointments  In addition, I have reviewed and discussed with patient certain preventive protocols, quality metrics, and best practice recommendations. A written personalized care plan for preventive services as well as general preventive health recommendations were provided to  patient.     Lorrene Reid, LPN   04/08/1094   Nurse Notes: none

## 2022-08-07 NOTE — Patient Instructions (Signed)
Sheri Russell , Thank you for taking time to come for your Medicare Wellness Visit. I appreciate your ongoing commitment to your health goals. Please review the following plan we discussed and let me know if I can assist you in the future.   These are the goals we discussed:  Goals       Client will talk with LCSW in next 30 days about medical needs of client and ADLs completion of client (pt-stated)      CARE PLAN ENTRY   Current Barriers:  Patient with Chronic Diagnoses of HTN, HLD, Cancer of Vagina, Mild intermittent asthma with acute exacerbation  Clinical Social Work Clinical Goal(s):  LCSW will talk with client in next 30 days about medical needs of client and ADLs completion daily for client  Interventions:  Talked with client about CCM program services Talked with client about upcoming medical appointments Talked with client about ambulation needs of client Talke dwith client about transport needs of client Talked with client about pain issues of client Talked with client about vision needs of client Talked with client about social support (daughter is supportive ) Talked with client about RNCM nursing support with CCM program Talked with client about ADLs completion daily Talked with client about mood of client Completed PHQ 2/9; completed GAD-7  Patient Self Care Activities:   Completes ADLs independently Attends scheduled medical appointments Drives car to appointments and to complete errands   Patient Self Care Deficits:   Astha   Initial goal documentation       Exercise 3x per week (30 min per time)      Encourage increase in walking from 2 to 3 sessions per week for 30 minutes each time.       Manage My Emotions Manage Grief issues faced. Manage depression issues      Timeframe:  Short-Term Goal Priority:  Medium Progress:  On Track Start Date:        04/06/21                   Expected End Date:        07/04/21          Follow Up Date   05/31/21 at  3:00 PM  Manage My Emotions; Manage Grief issues faced. Manage depression issues    Why is this important?   When you are stressed, down or upset, your body reacts too.  For example, your blood pressure may get higher; you may have a headache or stomachache.  When your emotions get the best of you, your body's ability to fight off cold and flu gets weak.  These steps will help you manage your emotions.     Patient Self Care Activities:  Self administers medications as prescribed Attends all scheduled provider appointments Performs ADL's independently Performs IADL's independently  Patient Coping Strengths:  Family Friends  Patient Self Care Deficits:  Grief issues are challenging for client  Patient Goals:  - spend time or talk with others at least 2 to 3 times per week - practice relaxation or meditation daily - keep a calendar with appointment dates  Follow Up Plan:  LCSW to call client on 05/31/21 at 3:00 PM to assess client needs      Prevent falls        This is a list of the screening recommended for you and due dates:  Health Maintenance  Topic Date Due   COVID-19 Vaccine (3 - Moderna risk series) 08/17/2022*  Zoster (Shingles) Vaccine (1 of 2) 08/23/2022*   Mammogram  01/25/2023*   Flu Shot  11/02/2022   DEXA scan (bone density measurement)  07/26/2023   Medicare Annual Wellness Visit  08/07/2023   DTaP/Tdap/Td vaccine (4 - Td or Tdap) 07/22/2024   Pneumonia Vaccine  Completed   Hepatitis C Screening: USPSTF Recommendation to screen - Ages 57-79 yo.  Completed   HPV Vaccine  Aged Out  *Topic was postponed. The date shown is not the original due date.    Advanced directives: Advance directive discussed with you today. I have provided a copy for you to complete at home and have notarized. Once this is complete please bring a copy in to our office so we can scan it into your chart.   Conditions/risks identified: Aim for 30 minutes of exercise or brisk  walking, 6-8 glasses of water, and 5 servings of fruits and vegetables each day.   Next appointment: Follow up in one year for your annual wellness visit    Preventive Care 65 Years and Older, Female Preventive care refers to lifestyle choices and visits with your health care provider that can promote health and wellness. What does preventive care include? A yearly physical exam. This is also called an annual well check. Dental exams once or twice a year. Routine eye exams. Ask your health care provider how often you should have your eyes checked. Personal lifestyle choices, including: Daily care of your teeth and gums. Regular physical activity. Eating a healthy diet. Avoiding tobacco and drug use. Limiting alcohol use. Practicing safe sex. Taking low-dose aspirin every day. Taking vitamin and mineral supplements as recommended by your health care provider. What happens during an annual well check? The services and screenings done by your health care provider during your annual well check will depend on your age, overall health, lifestyle risk factors, and family history of disease. Counseling  Your health care provider may ask you questions about your: Alcohol use. Tobacco use. Drug use. Emotional well-being. Home and relationship well-being. Sexual activity. Eating habits. History of falls. Memory and ability to understand (cognition). Work and work Astronomer. Reproductive health. Screening  You may have the following tests or measurements: Height, weight, and BMI. Blood pressure. Lipid and cholesterol levels. These may be checked every 5 years, or more frequently if you are over 57 years old. Skin check. Lung cancer screening. You may have this screening every year starting at age 9 if you have a 30-pack-year history of smoking and currently smoke or have quit within the past 15 years. Fecal occult blood test (FOBT) of the stool. You may have this test every year  starting at age 31. Flexible sigmoidoscopy or colonoscopy. You may have a sigmoidoscopy every 5 years or a colonoscopy every 10 years starting at age 37. Hepatitis C blood test. Hepatitis B blood test. Sexually transmitted disease (STD) testing. Diabetes screening. This is done by checking your blood sugar (glucose) after you have not eaten for a while (fasting). You may have this done every 1-3 years. Bone density scan. This is done to screen for osteoporosis. You may have this done starting at age 72. Mammogram. This may be done every 1-2 years. Talk to your health care provider about how often you should have regular mammograms. Talk with your health care provider about your test results, treatment options, and if necessary, the need for more tests. Vaccines  Your health care provider may recommend certain vaccines, such as: Influenza vaccine. This is recommended  every year. Tetanus, diphtheria, and acellular pertussis (Tdap, Td) vaccine. You may need a Td booster every 10 years. Zoster vaccine. You may need this after age 33. Pneumococcal 13-valent conjugate (PCV13) vaccine. One dose is recommended after age 40. Pneumococcal polysaccharide (PPSV23) vaccine. One dose is recommended after age 78. Talk to your health care provider about which screenings and vaccines you need and how often you need them. This information is not intended to replace advice given to you by your health care provider. Make sure you discuss any questions you have with your health care provider. Document Released: 04/16/2015 Document Revised: 12/08/2015 Document Reviewed: 01/19/2015 Elsevier Interactive Patient Education  2017 [employer].  Fall Prevention in the Home Falls can cause injuries. They can happen to people of all ages. There are many things you can do to make your home safe and to help prevent falls. What can I do on the outside of my home? Regularly fix the edges of walkways and driveways and fix any  cracks. Remove anything that might make you trip as you walk through a door, such as a raised step or threshold. Trim any bushes or trees on the path to your home. Use bright outdoor lighting. Clear any walking paths of anything that might make someone trip, such as rocks or tools. Regularly check to see if handrails are loose or broken. Make sure that both sides of any steps have handrails. Any raised decks and porches should have guardrails on the edges. Have any leaves, snow, or ice cleared regularly. Use sand or salt on walking paths during winter. Clean up any spills in your garage right away. This includes oil or grease spills. What can I do in the bathroom? Use night lights. Install grab bars by the toilet and in the tub and shower. Do not use towel bars as grab bars. Use non-skid mats or decals in the tub or shower. If you need to sit down in the shower, use a plastic, non-slip stool. Keep the floor dry. Clean up any water that spills on the floor as soon as it happens. Remove soap buildup in the tub or shower regularly. Attach bath mats securely with double-sided non-slip rug tape. Do not have throw rugs and other things on the floor that can make you trip. What can I do in the bedroom? Use night lights. Make sure that you have a light by your bed that is easy to reach. Do not use any sheets or blankets that are too big for your bed. They should not hang down onto the floor. Have a firm chair that has side arms. You can use this for support while you get dressed. Do not have throw rugs and other things on the floor that can make you trip. What can I do in the kitchen? Clean up any spills right away. Avoid walking on wet floors. Keep items that you use a lot in easy-to-reach places. If you need to reach something above you, use a strong step stool that has a grab bar. Keep electrical cords out of the way. Do not use floor polish or wax that makes floors slippery. If you must  use wax, use non-skid floor wax. Do not have throw rugs and other things on the floor that can make you trip. What can I do with my stairs? Do not leave any items on the stairs. Make sure that there are handrails on both sides of the stairs and use them. Fix handrails that are broken  or loose. Make sure that handrails are as long as the stairways. Check any carpeting to make sure that it is firmly attached to the stairs. Fix any carpet that is loose or worn. Avoid having throw rugs at the top or bottom of the stairs. If you do have throw rugs, attach them to the floor with carpet tape. Make sure that you have a light switch at the top of the stairs and the bottom of the stairs. If you do not have them, ask someone to add them for you. What else can I do to help prevent falls? Wear shoes that: Do not have high heels. Have rubber bottoms. Are comfortable and fit you well. Are closed at the toe. Do not wear sandals. If you use a stepladder: Make sure that it is fully opened. Do not climb a closed stepladder. Make sure that both sides of the stepladder are locked into place. Ask someone to hold it for you, if possible. Clearly mark and make sure that you can see: Any grab bars or handrails. First and last steps. Where the edge of each step is. Use tools that help you move around (mobility aids) if they are needed. These include: Canes. Walkers. Scooters. Crutches. Turn on the lights when you go into a dark area. Replace any light bulbs as soon as they burn out. Set up your furniture so you have a clear path. Avoid moving your furniture around. If any of your floors are uneven, fix them. If there are any pets around you, be aware of where they are. Review your medicines with your doctor. Some medicines can make you feel dizzy. This can increase your chance of falling. Ask your doctor what other things that you can do to help prevent falls. This information is not intended to replace  advice given to you by your health care provider. Make sure you discuss any questions you have with your health care provider. Document Released: 01/14/2009 Document Revised: 08/26/2015 Document Reviewed: 04/24/2014 Elsevier Interactive Patient Education  2017 [employer].

## 2022-08-29 DIAGNOSIS — R309 Painful micturition, unspecified: Secondary | ICD-10-CM | POA: Diagnosis not present

## 2022-08-29 DIAGNOSIS — N3001 Acute cystitis with hematuria: Secondary | ICD-10-CM | POA: Diagnosis not present

## 2022-09-01 DIAGNOSIS — C52 Malignant neoplasm of vagina: Secondary | ICD-10-CM | POA: Diagnosis not present

## 2022-09-07 DIAGNOSIS — I10 Essential (primary) hypertension: Secondary | ICD-10-CM | POA: Diagnosis not present

## 2022-09-07 DIAGNOSIS — N390 Urinary tract infection, site not specified: Secondary | ICD-10-CM | POA: Diagnosis not present

## 2022-09-07 DIAGNOSIS — J45909 Unspecified asthma, uncomplicated: Secondary | ICD-10-CM | POA: Diagnosis not present

## 2022-09-07 DIAGNOSIS — H8113 Benign paroxysmal vertigo, bilateral: Secondary | ICD-10-CM | POA: Diagnosis not present

## 2022-09-07 DIAGNOSIS — R32 Unspecified urinary incontinence: Secondary | ICD-10-CM | POA: Diagnosis not present

## 2022-09-07 DIAGNOSIS — Z8551 Personal history of malignant neoplasm of bladder: Secondary | ICD-10-CM | POA: Diagnosis not present

## 2022-09-07 DIAGNOSIS — Z933 Colostomy status: Secondary | ICD-10-CM | POA: Diagnosis not present

## 2022-09-07 DIAGNOSIS — Z8541 Personal history of malignant neoplasm of cervix uteri: Secondary | ICD-10-CM | POA: Diagnosis not present

## 2022-09-08 DIAGNOSIS — Z932 Ileostomy status: Secondary | ICD-10-CM | POA: Diagnosis not present

## 2022-09-08 DIAGNOSIS — K631 Perforation of intestine (nontraumatic): Secondary | ICD-10-CM | POA: Diagnosis not present

## 2022-09-08 DIAGNOSIS — Z933 Colostomy status: Secondary | ICD-10-CM | POA: Diagnosis not present

## 2022-09-22 DIAGNOSIS — C52 Malignant neoplasm of vagina: Secondary | ICD-10-CM | POA: Diagnosis not present

## 2022-09-22 DIAGNOSIS — K435 Parastomal hernia without obstruction or  gangrene: Secondary | ICD-10-CM | POA: Diagnosis not present

## 2022-09-22 DIAGNOSIS — I708 Atherosclerosis of other arteries: Secondary | ICD-10-CM | POA: Diagnosis not present

## 2022-09-22 DIAGNOSIS — N261 Atrophy of kidney (terminal): Secondary | ICD-10-CM | POA: Diagnosis not present

## 2022-10-02 ENCOUNTER — Other Ambulatory Visit: Payer: Self-pay | Admitting: Nurse Practitioner

## 2022-10-02 DIAGNOSIS — Z1211 Encounter for screening for malignant neoplasm of colon: Secondary | ICD-10-CM

## 2022-10-06 DIAGNOSIS — K625 Hemorrhage of anus and rectum: Secondary | ICD-10-CM | POA: Diagnosis not present

## 2022-10-06 DIAGNOSIS — Z933 Colostomy status: Secondary | ICD-10-CM | POA: Insufficient documentation

## 2022-10-06 DIAGNOSIS — N1330 Unspecified hydronephrosis: Secondary | ICD-10-CM | POA: Diagnosis not present

## 2022-10-09 ENCOUNTER — Other Ambulatory Visit: Payer: Self-pay

## 2022-10-09 DIAGNOSIS — N1330 Unspecified hydronephrosis: Secondary | ICD-10-CM | POA: Diagnosis not present

## 2022-10-09 DIAGNOSIS — Z1211 Encounter for screening for malignant neoplasm of colon: Secondary | ICD-10-CM

## 2022-10-09 DIAGNOSIS — C52 Malignant neoplasm of vagina: Secondary | ICD-10-CM | POA: Diagnosis not present

## 2022-11-04 DIAGNOSIS — R300 Dysuria: Secondary | ICD-10-CM | POA: Diagnosis not present

## 2022-11-04 DIAGNOSIS — N3001 Acute cystitis with hematuria: Secondary | ICD-10-CM | POA: Diagnosis not present

## 2022-11-17 DIAGNOSIS — Z87440 Personal history of urinary (tract) infections: Secondary | ICD-10-CM | POA: Diagnosis not present

## 2022-11-17 DIAGNOSIS — Z91041 Radiographic dye allergy status: Secondary | ICD-10-CM | POA: Diagnosis not present

## 2022-11-17 DIAGNOSIS — Z171 Estrogen receptor negative status [ER-]: Secondary | ICD-10-CM | POA: Diagnosis not present

## 2022-11-17 DIAGNOSIS — J45909 Unspecified asthma, uncomplicated: Secondary | ICD-10-CM | POA: Diagnosis not present

## 2022-11-17 DIAGNOSIS — C679 Malignant neoplasm of bladder, unspecified: Secondary | ICD-10-CM | POA: Diagnosis not present

## 2022-11-17 DIAGNOSIS — I10 Essential (primary) hypertension: Secondary | ICD-10-CM | POA: Diagnosis not present

## 2022-11-17 DIAGNOSIS — N1330 Unspecified hydronephrosis: Secondary | ICD-10-CM | POA: Diagnosis not present

## 2022-11-17 DIAGNOSIS — N895 Stricture and atresia of vagina: Secondary | ICD-10-CM | POA: Diagnosis not present

## 2022-11-17 DIAGNOSIS — C52 Malignant neoplasm of vagina: Secondary | ICD-10-CM | POA: Diagnosis not present

## 2022-11-17 DIAGNOSIS — R93421 Abnormal radiologic findings on diagnostic imaging of right kidney: Secondary | ICD-10-CM | POA: Diagnosis not present

## 2022-11-17 DIAGNOSIS — N820 Vesicovaginal fistula: Secondary | ICD-10-CM | POA: Diagnosis not present

## 2022-11-17 DIAGNOSIS — D494 Neoplasm of unspecified behavior of bladder: Secondary | ICD-10-CM | POA: Diagnosis not present

## 2022-11-17 DIAGNOSIS — N3289 Other specified disorders of bladder: Secondary | ICD-10-CM | POA: Diagnosis not present

## 2022-11-17 DIAGNOSIS — N390 Urinary tract infection, site not specified: Secondary | ICD-10-CM | POA: Diagnosis not present

## 2022-11-17 DIAGNOSIS — E785 Hyperlipidemia, unspecified: Secondary | ICD-10-CM | POA: Diagnosis not present

## 2022-11-22 DIAGNOSIS — C679 Malignant neoplasm of bladder, unspecified: Secondary | ICD-10-CM | POA: Diagnosis not present

## 2022-11-27 DIAGNOSIS — N1330 Unspecified hydronephrosis: Secondary | ICD-10-CM | POA: Insufficient documentation

## 2022-11-27 DIAGNOSIS — C679 Malignant neoplasm of bladder, unspecified: Secondary | ICD-10-CM | POA: Diagnosis not present

## 2022-11-28 ENCOUNTER — Telehealth: Payer: Self-pay

## 2022-11-28 DIAGNOSIS — C763 Malignant neoplasm of pelvis: Secondary | ICD-10-CM | POA: Diagnosis not present

## 2022-11-28 DIAGNOSIS — Z91041 Radiographic dye allergy status: Secondary | ICD-10-CM | POA: Diagnosis not present

## 2022-11-28 DIAGNOSIS — N1330 Unspecified hydronephrosis: Secondary | ICD-10-CM | POA: Diagnosis not present

## 2022-11-28 NOTE — Telephone Encounter (Signed)
Fax sent to the plan Your PA has been faxed to the plan as a paper copy. Please contact the plan directly if you haven't received a determination in a typical timeframe.  You will be notified of the determination via fax. How do I know if the plan approved the PA?  Add Reminder to your Dashboard Remind me in:  5 business days Contact plan to follow up on [id number]

## 2022-11-28 NOTE — Telephone Encounter (Signed)
Pharmacy Patient Advocate Encounter   Received notification from Pt Calls Messages that prior authorization for Ondansetron HCl 4MG  tablets is required/requested.   Insurance verification completed.   The patient is insured through [employer]/ [employer] .   Per test claim: PA required; PA submitted to [employer]/ [employer] via Fax Key/confirmation #[id number] --- Status is pending

## 2022-11-30 DIAGNOSIS — C52 Malignant neoplasm of vagina: Secondary | ICD-10-CM | POA: Diagnosis not present

## 2022-11-30 DIAGNOSIS — D494 Neoplasm of unspecified behavior of bladder: Secondary | ICD-10-CM | POA: Diagnosis not present

## 2022-11-30 DIAGNOSIS — N823 Fistula of vagina to large intestine: Secondary | ICD-10-CM | POA: Diagnosis not present

## 2022-11-30 NOTE — Telephone Encounter (Signed)
Pharmacy Patient Advocate Encounter  Received notification from [employer] that Prior Authorization for Ondansetron 4MG  has been DENIED. Please advise how you'd like to proceed. Full denial letter will be uploaded to the media tab. See denial reason below.    Denial Reason: Nausea with vomiting is not approved or medically accepted.

## 2022-12-01 DIAGNOSIS — N1330 Unspecified hydronephrosis: Secondary | ICD-10-CM | POA: Diagnosis not present

## 2022-12-01 DIAGNOSIS — I10 Essential (primary) hypertension: Secondary | ICD-10-CM | POA: Diagnosis not present

## 2022-12-01 DIAGNOSIS — Z8544 Personal history of malignant neoplasm of other female genital organs: Secondary | ICD-10-CM | POA: Diagnosis not present

## 2022-12-01 DIAGNOSIS — C763 Malignant neoplasm of pelvis: Secondary | ICD-10-CM | POA: Diagnosis not present

## 2022-12-01 DIAGNOSIS — E785 Hyperlipidemia, unspecified: Secondary | ICD-10-CM | POA: Diagnosis not present

## 2022-12-01 DIAGNOSIS — J45909 Unspecified asthma, uncomplicated: Secondary | ICD-10-CM | POA: Diagnosis not present

## 2022-12-01 NOTE — Telephone Encounter (Signed)
Why is insurance denying zofran?

## 2022-12-01 NOTE — Telephone Encounter (Signed)
Pleaselet patient [employer] will not cover this any  longer

## 2022-12-01 NOTE — Telephone Encounter (Signed)
According to denial letter it will only cover for nausea and vomiting due to chemo or radiation or nausea and vomiting post surgery

## 2022-12-05 NOTE — Telephone Encounter (Signed)
Patient aware and verbalizes understanding. 

## 2022-12-11 DIAGNOSIS — C679 Malignant neoplasm of bladder, unspecified: Secondary | ICD-10-CM | POA: Diagnosis not present

## 2022-12-18 DIAGNOSIS — C801 Malignant (primary) neoplasm, unspecified: Secondary | ICD-10-CM | POA: Diagnosis not present

## 2023-01-02 DIAGNOSIS — Z9049 Acquired absence of other specified parts of digestive tract: Secondary | ICD-10-CM | POA: Diagnosis not present

## 2023-01-02 DIAGNOSIS — K760 Fatty (change of) liver, not elsewhere classified: Secondary | ICD-10-CM | POA: Diagnosis not present

## 2023-01-02 DIAGNOSIS — Z960 Presence of urogenital implants: Secondary | ICD-10-CM | POA: Diagnosis not present

## 2023-01-02 DIAGNOSIS — C679 Malignant neoplasm of bladder, unspecified: Secondary | ICD-10-CM | POA: Diagnosis not present

## 2023-01-02 DIAGNOSIS — C674 Malignant neoplasm of posterior wall of bladder: Secondary | ICD-10-CM | POA: Diagnosis not present

## 2023-01-11 DIAGNOSIS — N131 Hydronephrosis with ureteral stricture, not elsewhere classified: Secondary | ICD-10-CM | POA: Diagnosis not present

## 2023-01-11 DIAGNOSIS — Z436 Encounter for attention to other artificial openings of urinary tract: Secondary | ICD-10-CM | POA: Diagnosis not present

## 2023-01-11 DIAGNOSIS — Z466 Encounter for fitting and adjustment of urinary device: Secondary | ICD-10-CM | POA: Diagnosis not present

## 2023-01-11 DIAGNOSIS — N135 Crossing vessel and stricture of ureter without hydronephrosis: Secondary | ICD-10-CM | POA: Diagnosis not present

## 2023-01-12 DIAGNOSIS — K631 Perforation of intestine (nontraumatic): Secondary | ICD-10-CM | POA: Diagnosis not present

## 2023-01-12 DIAGNOSIS — Z932 Ileostomy status: Secondary | ICD-10-CM | POA: Diagnosis not present

## 2023-01-12 DIAGNOSIS — Z933 Colostomy status: Secondary | ICD-10-CM | POA: Diagnosis not present

## 2023-01-22 NOTE — Patient Instructions (Signed)
Our records indicate that you are due for your screening mammogram.  Please call the imaging center that does your yearly mammograms to make an appointment for a mammogram at your earliest convenience. Our office also has a mobile unit through the [facility] that comes to our location. Please call our office if you would like to make an appointment.   

## 2023-01-29 DIAGNOSIS — C679 Malignant neoplasm of bladder, unspecified: Secondary | ICD-10-CM | POA: Diagnosis not present

## 2023-01-29 DIAGNOSIS — C801 Malignant (primary) neoplasm, unspecified: Secondary | ICD-10-CM | POA: Insufficient documentation

## 2023-01-30 ENCOUNTER — Ambulatory Visit (INDEPENDENT_AMBULATORY_CARE_PROVIDER_SITE_OTHER): Payer: HMO | Admitting: Nurse Practitioner

## 2023-01-30 ENCOUNTER — Encounter: Payer: Self-pay | Admitting: Nurse Practitioner

## 2023-01-30 VITALS — BP 139/80 | HR 98 | Temp 98.4°F | Resp 20 | Ht 65.0 in | Wt 139.0 lb

## 2023-01-30 DIAGNOSIS — I10 Essential (primary) hypertension: Secondary | ICD-10-CM

## 2023-01-30 DIAGNOSIS — N3281 Overactive bladder: Secondary | ICD-10-CM | POA: Diagnosis not present

## 2023-01-30 DIAGNOSIS — M8588 Other specified disorders of bone density and structure, other site: Secondary | ICD-10-CM

## 2023-01-30 DIAGNOSIS — Z23 Encounter for immunization: Secondary | ICD-10-CM | POA: Diagnosis not present

## 2023-01-30 DIAGNOSIS — E785 Hyperlipidemia, unspecified: Secondary | ICD-10-CM

## 2023-01-30 MED ORDER — OXYBUTYNIN CHLORIDE 5 MG PO TABS: 5.0000 mg | ORAL_TABLET | Freq: Every day | ORAL | 1 refills | Status: DC

## 2023-01-30 MED ORDER — ENALAPRIL MALEATE 20 MG PO TABS: 20.0000 mg | ORAL_TABLET | Freq: Two times a day (BID) | ORAL | 1 refills | Status: DC

## 2023-01-30 NOTE — Progress Notes (Signed)
Subjective:    Patient ID: Sheri Russell, female    DOB: 09/09/1945, 77 y.o.   MRN: [id number]   Chief Complaint: medical management of chronic issues     HPI:  Sheri Russell is a 77 y.o. who identifies as a female who was assigned female at birth.   Social history: Lives with: by herself Work history: retired   Water engineer in today for follow up of the following chronic medical issues:  1. Essential hypertension, benign No c/o chest pain, sob or headache. Does not check blood pressure at home. BP Readings from Last 3 Encounters:  08/01/22 136/78  05/25/22 124/77  01/24/22 114/70     2. Hyperlipidemia with target LDL less than 100 Does wathc diet and exercises several times a week. Lab Results  Component Value Date   CHOL 164 08/01/2022   HDL 60 08/01/2022   LDLCALC 79 08/01/2022   TRIG 147 08/01/2022   CHOLHDL 2.7 08/01/2022     3. Osteopenia of lumbar spine Last dexascan was done on 07/25/21. 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 She was having hydronephrosis and has had to had have a tube put in kidney to drain it. She has refused surgery  Allergies  Allergen Reactions   Fish Allergy    Iohexol     IVP Dye    Iodinated Contrast Media Rash   Other Hives and Rash    Seafood perch, red snapper any seafood with iodine   Outpatient Encounter Medications as of 01/30/2023  Medication Sig   enalapril (VASOTEC) 20 MG tablet Take 1 tablet (20 mg total) by mouth 2 (two) times daily.   ipratropium (ATROVENT) 0.06 % nasal spray 2 sprays into each nostril Three (3) times a day.   meclizine (ANTIVERT) 25 MG tablet TAKE 1 TABLET BY MOUTH THREE TIMES DAILY AS NEEDED FOR DIZZINESS   ondansetron (ZOFRAN) 4 MG tablet TAKE 1 TABLET BY MOUTH EVERY 8 HOURS AS NEEDED FOR NAUSEA AND VOMITING   oxybutynin (DITROPAN) 5 MG tablet Take 1 tablet (5 mg total) by  mouth at bedtime.   VENTOLIN HFA 108 (90 Base) MCG/ACT inhaler INHALE 2 PUFFS BY MOUTH EVERY 6 HOURS AS NEEDED FOR WHEEZING AND FOR SHORTNESS OF BREATH   No facility-administered encounter medications on file as of 01/30/2023.    Past Surgical History:  Procedure Laterality Date   APPENDECTOMY  1986   CHOLECYSTECTOMY  1985   colostomy bag     Several cancer surgeries  1971, 1985, & 1986   TOTAL ABDOMINAL HYSTERECTOMY  1986    Family History  Problem Relation Age of Onset   Diabetes Mother    Hypertension Mother    Heart attack Mother    Heart disease Mother    Diabetes Father    Hypertension Father    Heart attack Father    Heart disease Father    Diabetes Maternal Grandmother    Hypertension Maternal Grandmother    Heart attack Maternal Grandmother    Diabetes Maternal Grandfather    Hypertension Maternal Grandfather    Heart attack Maternal Grandfather    Diabetes Paternal Grandmother    Hypertension Paternal Grandmother    Heart attack Paternal Grandmother    Diabetes Paternal Grandfather    Hypertension Paternal Grandfather    Heart attack Paternal Grandfather    Stroke Brother    Hypertension Brother  Asthma Brother    Liver cancer Brother    Alcohol abuse Brother    Cancer Sister        colon cancer    Diabetes Sister    Diabetes Sister    Diabetes Sister    Hypertension Brother    Diabetes Brother       Controlled substance contract: n/a     Review of Systems  Constitutional:  Negative for diaphoresis.  Eyes:  Negative for pain.  Respiratory:  Negative for shortness of breath.   Cardiovascular:  Negative for chest pain, palpitations and leg swelling.  Gastrointestinal:  Negative for abdominal pain.  Endocrine: Negative for polydipsia.  Skin:  Negative for rash.  Neurological:  Negative for dizziness, weakness and headaches.  Hematological:  Does not bruise/bleed easily.  All other systems reviewed and are negative.      Objective:    Physical Exam Vitals and nursing note reviewed.  Constitutional:      General: She is not in acute distress.    Appearance: Normal appearance. She is well-developed.  HENT:     Head: Normocephalic.     Right Ear: Tympanic membrane normal.     Left Ear: Tympanic membrane normal.     Nose: Nose normal.     Mouth/Throat:     Mouth: Mucous membranes are moist.  Eyes:     Pupils: Pupils are equal, round, and reactive to light.  Neck:     Vascular: No carotid bruit or JVD.  Cardiovascular:     Rate and Rhythm: Normal rate and regular rhythm.     Heart sounds: Normal heart sounds.  Pulmonary:     Effort: Pulmonary effort is normal. No respiratory distress.     Breath sounds: Normal breath sounds. No wheezing or rales.  Chest:     Chest wall: No tenderness.  Abdominal:     General: Bowel sounds are normal. There is no distension or abdominal bruit.     Palpations: Abdomen is soft. There is no hepatomegaly, splenomegaly, mass or pulsatile mass.     Tenderness: There is no abdominal tenderness.  Musculoskeletal:        General: Normal range of motion.     Cervical back: Normal range of motion and neck supple.  Lymphadenopathy:     Cervical: No cervical adenopathy.  Skin:    General: Skin is warm and dry.  Neurological:     Mental Status: She is alert and oriented to person, place, and time.     Deep Tendon Reflexes: Reflexes are normal and symmetric.  Psychiatric:        Behavior: Behavior normal.        Thought Content: Thought content normal.        Judgment: Judgment normal.     BP 139/80   Pulse 98   Temp 98.4 F (36.9 C) (Temporal)   Resp 20   Ht 5\' 5"  (1.651 m)   Wt 139 lb (63 kg)   LMP 07/09/1975   SpO2 97%   BMI 23.13 kg/m        Assessment & Plan:   Sheri Russell comes in today with chief complaint of Medical Management of Chronic Issues   Diagnosis and orders addressed:  1. Essential hypertension, benign Low sodium diet - enalapril (VASOTEC) 20 MG  tablet; Take 1 tablet (20 mg total) by mouth 2 (two) times daily.  Dispense: 180 tablet; Refill: 1 - CBC with Differential/Platelet - CMP14+EGFR  2. Hyperlipidemia with target LDL  less than 100 Low fta diet - Lipid panel  3. Osteopenia of lumbar spine Weight bearing exercises  4. Overactive bladder - oxybutynin (DITROPAN) 5 MG tablet; Take 1 tablet (5 mg total) by mouth at bedtime.  Dispense: 90 tablet; Refill: 1   Labs pending Health Maintenance reviewed Diet and exercise encouraged  Follow up plan: 6 months   Mary-Margaret Daphine Deutscher, FNP

## 2023-01-31 LAB — LIPID PANEL
Chol/HDL Ratio: 2.8 ratio (ref 0.0–4.4)
Cholesterol, Total: 182 mg/dL (ref 100–199)
HDL: 66 mg/dL (ref >39)
LDL Chol Calc (NIH): 91 mg/dL (ref 0–99)
Triglycerides: 144 mg/dL (ref 0–149)
VLDL Cholesterol Cal: 25 mg/dL (ref 5–40)

## 2023-01-31 LAB — CBC WITH DIFFERENTIAL/PLATELET
Basophils Absolute: 0.1 10*3/uL (ref 0.0–0.2)
Basos: 1 %
EOS (ABSOLUTE): 0.3 10*3/uL (ref 0.0–0.4)
Eos: 4 %
Hematocrit: 35.8 % (ref 34.0–46.6)
Hemoglobin: 11.2 g/dL (ref 11.1–15.9)
Immature Grans (Abs): 0 10*3/uL (ref 0.0–0.1)
Immature Granulocytes: 0 %
Lymphocytes Absolute: 2.6 10*3/uL (ref 0.7–3.1)
Lymphs: 33 %
MCH: 26.5 pg — ABNORMAL LOW (ref 26.6–33.0)
MCHC: 31.3 g/dL — ABNORMAL LOW (ref 31.5–35.7)
MCV: 85 fL (ref 79–97)
Monocytes Absolute: 0.5 10*3/uL (ref 0.1–0.9)
Monocytes: 6 %
Neutrophils Absolute: 4.4 10*3/uL (ref 1.4–7.0)
Neutrophils: 56 %
Platelets: 223 10*3/uL (ref 150–450)
RBC: 4.23 x10E6/uL (ref 3.77–5.28)
RDW: 14.1 % (ref 11.7–15.4)
WBC: 7.8 10*3/uL (ref 3.4–10.8)

## 2023-01-31 LAB — CMP14+EGFR
ALT: 16 [IU]/L (ref 0–32)
AST: 17 [IU]/L (ref 0–40)
Albumin: 4.1 g/dL (ref 3.8–4.8)
Alkaline Phosphatase: 106 [IU]/L (ref 44–121)
BUN/Creatinine Ratio: 14 (ref 12–28)
BUN: 22 mg/dL (ref 8–27)
Bilirubin Total: <0.2 mg/dL (ref 0.0–1.2)
CO2: 22 mmol/L (ref 20–29)
Calcium: 9.3 mg/dL (ref 8.7–10.3)
Chloride: 106 mmol/L (ref 96–106)
Creatinine, Ser: 1.61 mg/dL — ABNORMAL HIGH (ref 0.57–1.00)
Globulin, Total: 2.7 g/dL (ref 1.5–4.5)
Glucose: 87 mg/dL (ref 70–99)
Potassium: 5.2 mmol/L (ref 3.5–5.2)
Sodium: 141 mmol/L (ref 134–144)
Total Protein: 6.8 g/dL (ref 6.0–8.5)
eGFR: 33 mL/min/{1.73_m2} — ABNORMAL LOW (ref >59)

## 2023-02-07 DIAGNOSIS — N1330 Unspecified hydronephrosis: Secondary | ICD-10-CM | POA: Diagnosis not present

## 2023-02-07 DIAGNOSIS — Z936 Other artificial openings of urinary tract status: Secondary | ICD-10-CM | POA: Diagnosis not present

## 2023-02-22 DIAGNOSIS — N131 Hydronephrosis with ureteral stricture, not elsewhere classified: Secondary | ICD-10-CM | POA: Diagnosis not present

## 2023-02-22 DIAGNOSIS — Z436 Encounter for attention to other artificial openings of urinary tract: Secondary | ICD-10-CM | POA: Diagnosis not present

## 2023-02-22 DIAGNOSIS — N1330 Unspecified hydronephrosis: Secondary | ICD-10-CM | POA: Diagnosis not present

## 2023-03-19 DIAGNOSIS — N390 Urinary tract infection, site not specified: Secondary | ICD-10-CM | POA: Diagnosis not present

## 2023-03-19 DIAGNOSIS — N3001 Acute cystitis with hematuria: Secondary | ICD-10-CM | POA: Diagnosis not present

## 2023-04-06 DIAGNOSIS — N1330 Unspecified hydronephrosis: Secondary | ICD-10-CM | POA: Diagnosis not present

## 2023-04-06 DIAGNOSIS — Z436 Encounter for attention to other artificial openings of urinary tract: Secondary | ICD-10-CM | POA: Diagnosis not present

## 2023-04-06 DIAGNOSIS — Z466 Encounter for fitting and adjustment of urinary device: Secondary | ICD-10-CM | POA: Diagnosis not present

## 2023-04-06 DIAGNOSIS — C679 Malignant neoplasm of bladder, unspecified: Secondary | ICD-10-CM | POA: Diagnosis not present

## 2023-04-06 DIAGNOSIS — Z79899 Other long term (current) drug therapy: Secondary | ICD-10-CM | POA: Diagnosis not present

## 2023-04-09 ENCOUNTER — Ambulatory Visit: Payer: HMO | Admitting: Family Medicine

## 2023-04-09 DIAGNOSIS — D494 Neoplasm of unspecified behavior of bladder: Secondary | ICD-10-CM | POA: Diagnosis not present

## 2023-04-09 DIAGNOSIS — R39198 Other difficulties with micturition: Secondary | ICD-10-CM | POA: Diagnosis not present

## 2023-04-09 DIAGNOSIS — R300 Dysuria: Secondary | ICD-10-CM | POA: Diagnosis not present

## 2023-04-18 DIAGNOSIS — N1330 Unspecified hydronephrosis: Secondary | ICD-10-CM | POA: Diagnosis not present

## 2023-04-18 DIAGNOSIS — Z1330 Encounter for screening examination for mental health and behavioral disorders, unspecified: Secondary | ICD-10-CM | POA: Diagnosis not present

## 2023-04-18 DIAGNOSIS — C763 Malignant neoplasm of pelvis: Secondary | ICD-10-CM | POA: Diagnosis not present

## 2023-04-18 DIAGNOSIS — N3090 Cystitis, unspecified without hematuria: Secondary | ICD-10-CM | POA: Diagnosis not present

## 2023-05-11 ENCOUNTER — Encounter: Payer: Self-pay | Admitting: Nurse Practitioner

## 2023-05-11 ENCOUNTER — Ambulatory Visit (INDEPENDENT_AMBULATORY_CARE_PROVIDER_SITE_OTHER): Payer: HMO | Admitting: Nurse Practitioner

## 2023-05-11 VITALS — BP 131/84 | HR 87 | Temp 98.0°F | Wt 139.0 lb

## 2023-05-11 DIAGNOSIS — R300 Dysuria: Secondary | ICD-10-CM | POA: Diagnosis not present

## 2023-05-11 LAB — URINALYSIS, COMPLETE
Bilirubin, UA: NEGATIVE
Glucose, UA: NEGATIVE
Ketones, UA: NEGATIVE
Nitrite, UA: NEGATIVE
Specific Gravity, UA: 1.02 (ref 1.005–1.030)
Urobilinogen, Ur: 0.2 mg/dL (ref 0.2–1.0)
pH, UA: 7 (ref 5.0–7.5)

## 2023-05-11 LAB — MICROSCOPIC EXAMINATION
Epithelial Cells (non renal): NONE SEEN /[HPF] (ref 0–10)
RBC, Urine: >30 /[HPF] — AB (ref 0–2)
Renal Epithel, UA: NONE SEEN /[HPF]
Trichomonas, UA: NONE SEEN
WBC, UA: >30 /[HPF] — AB (ref 0–5)
Yeast, UA: NONE SEEN

## 2023-05-11 MED ORDER — DOXYCYCLINE HYCLATE 100 MG PO TABS
100.0000 mg | ORAL_TABLET | Freq: Two times a day (BID) | ORAL | 0 refills | Status: DC
Start: 2023-05-11 — End: 2023-06-19

## 2023-05-11 NOTE — Patient Instructions (Signed)

## 2023-05-11 NOTE — Progress Notes (Signed)
 Subjective:    Patient ID: Sheri Russell, female    DOB: Aug 30, 1945, 78 y.o.   MRN: [id number]   Chief Complaint: Urinary Tract Infection (Symptoms started Monday 05/07/23)   Was seen on 04/09/23 and was given macrobid. Symptoms started reoccuring  Urinary Tract Infection  This is a new problem. The current episode started in the past 7 days. The problem occurs intermittently. The problem has been waxing and waning. The pain is at a severity of 7/10. The pain is mild. She is Not sexually active. There is No history of pyelonephritis. Associated symptoms include flank pain, hesitancy and urgency. She has tried nothing for the symptoms. The treatment provided mild relief.    Patient Active Problem List   Diagnosis Date Noted   Rosacea 07/25/2021   Osteopenia of lumbar spine 07/25/2021   Overactive bladder 01/20/2021   Allergic rhinitis 10/31/2013   Essential hypertension, benign 07/08/2012   Hyperlipidemia with target LDL less than 100 07/08/2012   Cancer of vagina (HCC) 01/28/2012       Review of Systems  Constitutional:  Negative for diaphoresis.  Eyes:  Negative for pain.  Respiratory:  Negative for shortness of breath.   Cardiovascular:  Negative for chest pain, palpitations and leg swelling.  Gastrointestinal:  Negative for abdominal pain.  Endocrine: Negative for polydipsia.  Genitourinary:  Positive for flank pain, hesitancy and urgency.  Skin:  Negative for rash.  Neurological:  Negative for dizziness, weakness and headaches.  Hematological:  Does not bruise/bleed easily.  All other systems reviewed and are negative.      Objective:   Physical Exam Constitutional:      Appearance: Normal appearance.  Cardiovascular:     Rate and Rhythm: Normal rate and regular rhythm.     Heart sounds: Normal heart sounds.  Pulmonary:     Effort: Pulmonary effort is normal.     Breath sounds: Normal breath sounds.  Skin:    General: Skin is warm.  Neurological:     General:  No focal deficit present.     Mental Status: She is alert and oriented to person, place, and time.  Psychiatric:        Mood and Affect: Mood normal.        Behavior: Behavior normal.     BP 131/84   Pulse 87   Temp 98 F (36.7 C)   Wt 139 lb (63 kg)   LMP 07/09/1975   SpO2 98%   BMI 23.13 kg/m        Assessment & Plan:   Sheri Russell in today with chief complaint of Urinary Tract Infection (Symptoms started Monday 05/07/23)   1. Dysuria (Primary) Take medication as prescribe Cotton underwear Take shower not bath Cranberry juice, yogurt Force fluids AZO over the counter X2 days Culture pending RTO prn  - Urinalysis, Complete - Urine Culture  Meds ordered this encounter  Medications   doxycycline  (VIBRA -TABS) 100 MG tablet    Sig: Take 1 tablet (100 mg total) by mouth 2 (two) times daily. 1 po bid    Dispense:  20 tablet    Refill:  0    Supervising Provider:   MARYANNE CHEW A [[id number]]     The above assessment and management plan was discussed with the patient. The patient verbalized understanding of and has agreed to the management plan. Patient is aware to call the clinic if symptoms persist or worsen. Patient is aware when to return to the clinic for a follow-up visit.  Patient educated on when it is appropriate to go to the emergency department.   [name], FNP

## 2023-05-13 LAB — URINE CULTURE

## 2023-05-22 DIAGNOSIS — Z436 Encounter for attention to other artificial openings of urinary tract: Secondary | ICD-10-CM | POA: Diagnosis not present

## 2023-05-22 DIAGNOSIS — Z936 Other artificial openings of urinary tract status: Secondary | ICD-10-CM | POA: Diagnosis not present

## 2023-05-22 DIAGNOSIS — C679 Malignant neoplasm of bladder, unspecified: Secondary | ICD-10-CM | POA: Diagnosis not present

## 2023-05-22 DIAGNOSIS — Z466 Encounter for fitting and adjustment of urinary device: Secondary | ICD-10-CM | POA: Diagnosis not present

## 2023-05-22 DIAGNOSIS — N1330 Unspecified hydronephrosis: Secondary | ICD-10-CM | POA: Diagnosis not present

## 2023-05-22 DIAGNOSIS — N320 Bladder-neck obstruction: Secondary | ICD-10-CM | POA: Diagnosis not present

## 2023-05-30 ENCOUNTER — Encounter: Payer: Self-pay | Admitting: *Deleted

## 2023-06-19 ENCOUNTER — Encounter: Payer: Self-pay | Admitting: Nurse Practitioner

## 2023-06-19 ENCOUNTER — Ambulatory Visit (INDEPENDENT_AMBULATORY_CARE_PROVIDER_SITE_OTHER): Admitting: Nurse Practitioner

## 2023-06-19 VITALS — BP 106/71 | HR 88 | Temp 97.8°F | Ht 65.0 in | Wt 139.0 lb

## 2023-06-19 DIAGNOSIS — N3000 Acute cystitis without hematuria: Secondary | ICD-10-CM | POA: Diagnosis not present

## 2023-06-19 DIAGNOSIS — R300 Dysuria: Secondary | ICD-10-CM

## 2023-06-19 LAB — MICROSCOPIC EXAMINATION
RBC, Urine: >30 /HPF — AB (ref 0–2)
Renal Epithel, UA: NONE SEEN /HPF
WBC, UA: >30 /HPF — AB (ref 0–5)

## 2023-06-19 LAB — URINALYSIS, COMPLETE
Bilirubin, UA: NEGATIVE
Glucose, UA: NEGATIVE
Ketones, UA: NEGATIVE
Nitrite, UA: POSITIVE — AB
Specific Gravity, UA: 1.02 (ref 1.005–1.030)
Urobilinogen, Ur: 0.2 mg/dL (ref 0.2–1.0)
pH, UA: 5.5 (ref 5.0–7.5)

## 2023-06-19 MED ORDER — CIPROFLOXACIN HCL 500 MG PO TABS: 500.0000 mg | ORAL_TABLET | Freq: Two times a day (BID) | ORAL | 0 refills | Status: DC

## 2023-06-19 NOTE — Progress Notes (Signed)
   Subjective:    Patient ID: Sheri Russell, female    DOB: 06/02/1945, 78 y.o.   MRN: [id number]   Chief Complaint: Urinary Tract Infection (Started Friday, burning while urinating.)   Urinary Tract Infection  This is a new problem. The current episode started in the past 7 days. The problem occurs intermittently. The problem has been waxing and waning. The quality of the pain is described as burning. The pain is at a severity of 6/10. The pain is moderate. She is Not sexually active. There is No history of pyelonephritis. Associated symptoms include flank pain, frequency, hesitancy and urgency. Pertinent negatives include no chills. Treatments tried: AZO. The treatment provided mild relief. Her past medical history is significant for recurrent UTIs.    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  Genitourinary:  Positive for flank pain, frequency, hesitancy and urgency.       Objective:   Physical Exam Constitutional:      Appearance: Normal appearance.  Cardiovascular:     Rate and Rhythm: Normal rate and regular rhythm.     Heart sounds: Normal heart sounds.  Pulmonary:     Breath sounds: Normal breath sounds.  Abdominal:     General: Abdomen is flat.     Tenderness: There is no abdominal tenderness. There is right CVA tenderness and left CVA tenderness.  Neurological:     General: No focal deficit present.     Mental Status: She is alert and oriented to person, place, and time.  Psychiatric:        Mood and Affect: Mood normal.        Behavior: Behavior normal.     BP 106/71   Pulse 88   Temp 97.8 F (36.6 C)   Ht 5\' 5"   (1.651 m)   Wt 139 lb (63 kg)   LMP 07/09/1975   SpO2 96%   BMI 23.13 kg/m        Assessment & Plan:  Sheri Russell in today with chief complaint of Urinary Tract Infection (Started Friday, burning while urinating.)   1. Dysuria (Primary)  - Urine Culture - Urinalysis, Complete  2. Acute cystitis without hematuria Take medication as prescribe Cotton underwear Take shower not bath Cranberry juice, yogurt Force fluids AZO over the counter X2 days Culture pending RTO prn Meds ordered this encounter  Medications   ciprofloxacin (CIPRO) 500 MG tablet    Sig: Take 1 tablet (500 mg total) by mouth 2 (two) times daily.    Dispense:  10 tablet    Refill:  0    Supervising Provider:   Arville Care A [[id number]]       The above assessment and management plan was discussed with the patient. The patient verbalized understanding of and has agreed to the management plan. Patient is aware to call the clinic if symptoms persist or worsen. Patient is aware when to return to the clinic for a follow-up visit. Patient educated on when it is appropriate to go to the emergency department.   Mary-Margaret Daphine Deutscher, FNP

## 2023-06-19 NOTE — Patient Instructions (Signed)

## 2023-06-21 LAB — URINE CULTURE

## 2023-06-21 NOTE — Addendum Note (Signed)
 Addended by: Bennie Pierini on: 06/21/2023 01:25 PM   Modules accepted: Orders

## 2023-06-27 DIAGNOSIS — Z466 Encounter for fitting and adjustment of urinary device: Secondary | ICD-10-CM | POA: Diagnosis not present

## 2023-06-27 DIAGNOSIS — C679 Malignant neoplasm of bladder, unspecified: Secondary | ICD-10-CM | POA: Diagnosis not present

## 2023-06-27 DIAGNOSIS — N1330 Unspecified hydronephrosis: Secondary | ICD-10-CM | POA: Diagnosis not present

## 2023-06-27 DIAGNOSIS — Z436 Encounter for attention to other artificial openings of urinary tract: Secondary | ICD-10-CM | POA: Diagnosis not present

## 2023-06-27 DIAGNOSIS — Z936 Other artificial openings of urinary tract status: Secondary | ICD-10-CM | POA: Diagnosis not present

## 2023-06-27 DIAGNOSIS — N135 Crossing vessel and stricture of ureter without hydronephrosis: Secondary | ICD-10-CM | POA: Diagnosis not present

## 2023-06-28 DIAGNOSIS — C801 Malignant (primary) neoplasm, unspecified: Secondary | ICD-10-CM | POA: Diagnosis not present

## 2023-06-28 DIAGNOSIS — C52 Malignant neoplasm of vagina: Secondary | ICD-10-CM | POA: Diagnosis not present

## 2023-06-28 IMAGING — MG MM DIGITAL SCREENING BILAT W/ TOMO AND CAD
8 series · 8 of 24 positions shown · non-contrast
Comparison: Previous exam(s).

CLINICAL DATA: Screening.

EXAM:
DIGITAL SCREENING BILATERAL MAMMOGRAM WITH TOMOSYNTHESIS AND CAD
TECHNIQUE: Bilateral screening digital craniocaudal and mediolateral oblique
mammograms were obtained. Bilateral screening digital breast
tomosynthesis was performed. The images were evaluated with
computer-aided detection.

[L CC synth-2D]
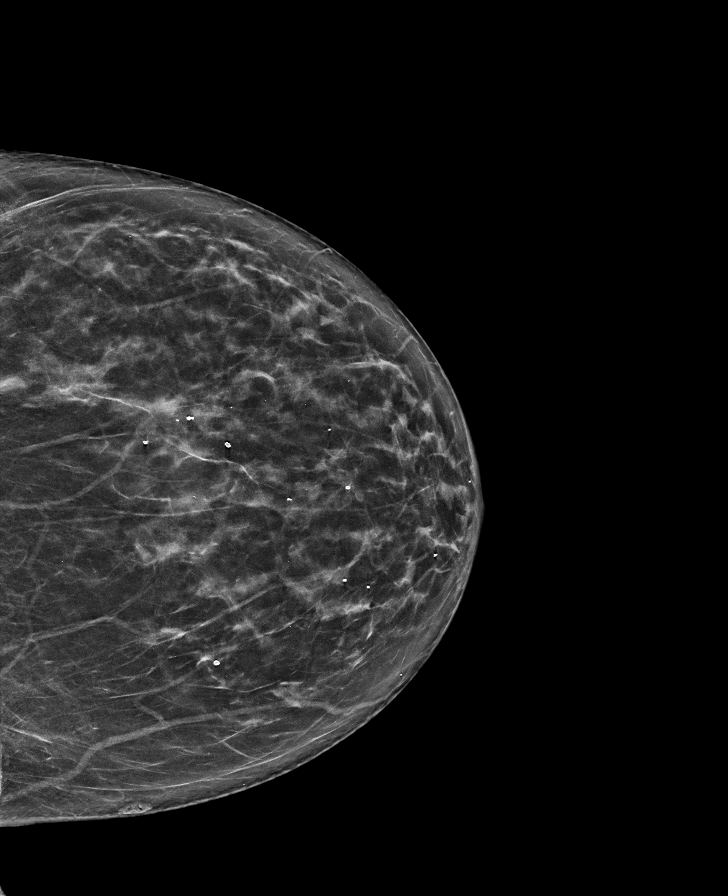

[L MLO synth-2D]
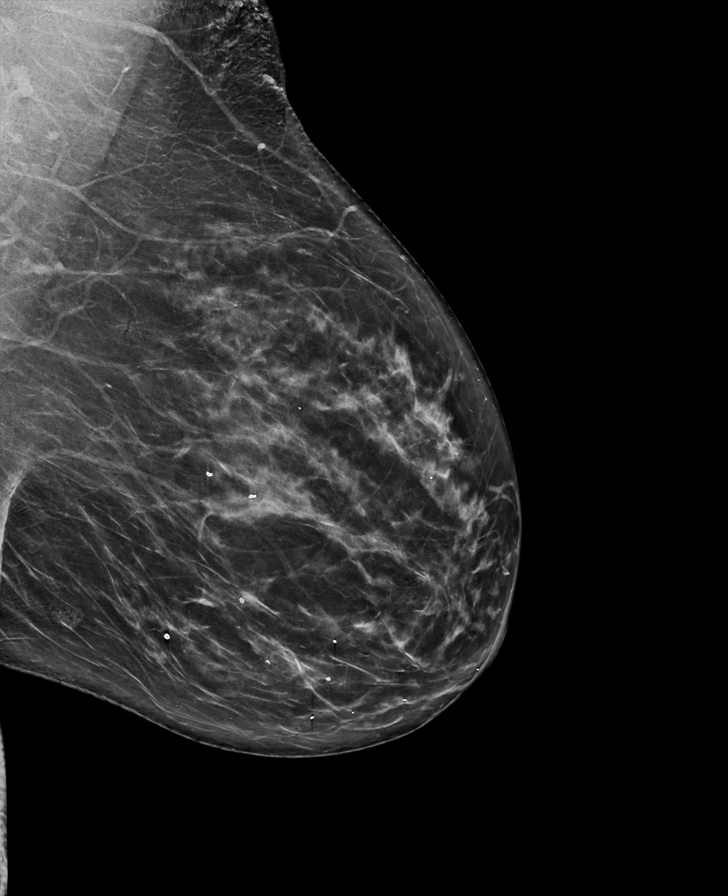

[R MLO synth-2D]
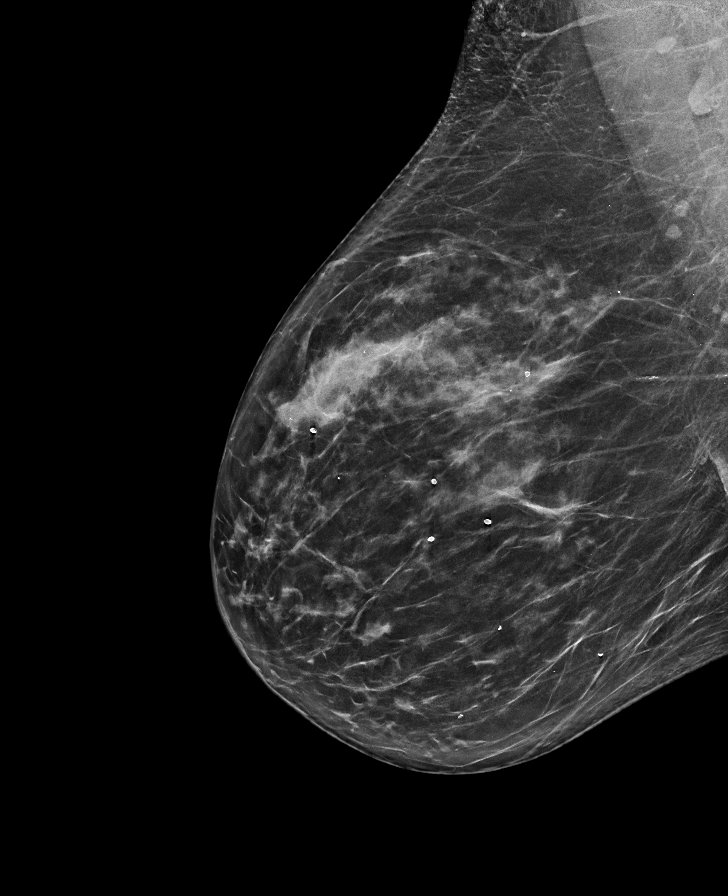

[R CC synth-2D]
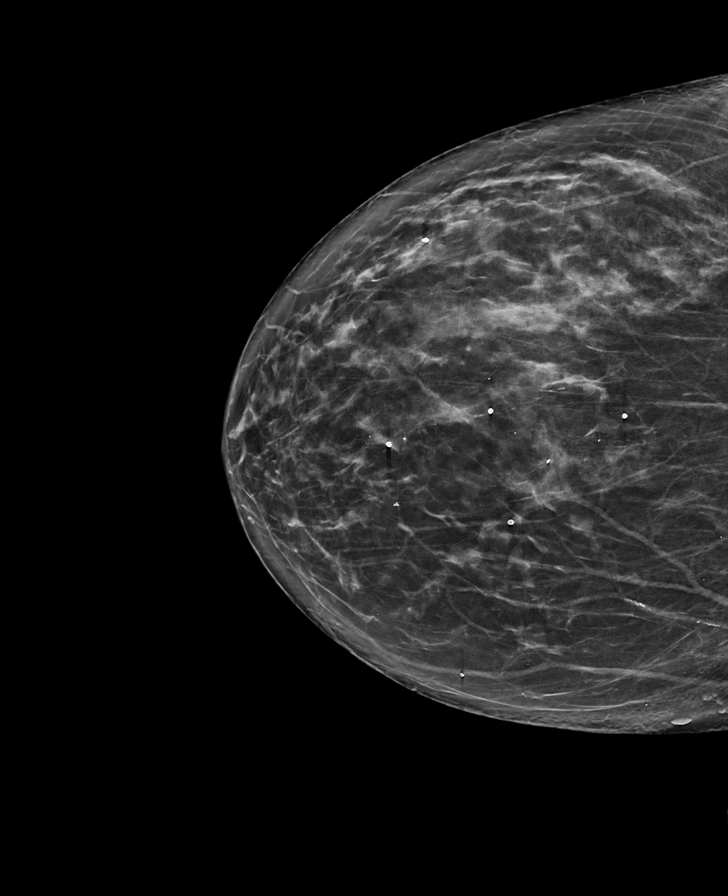

[L CC tomo · tomo slice 35/69.0]
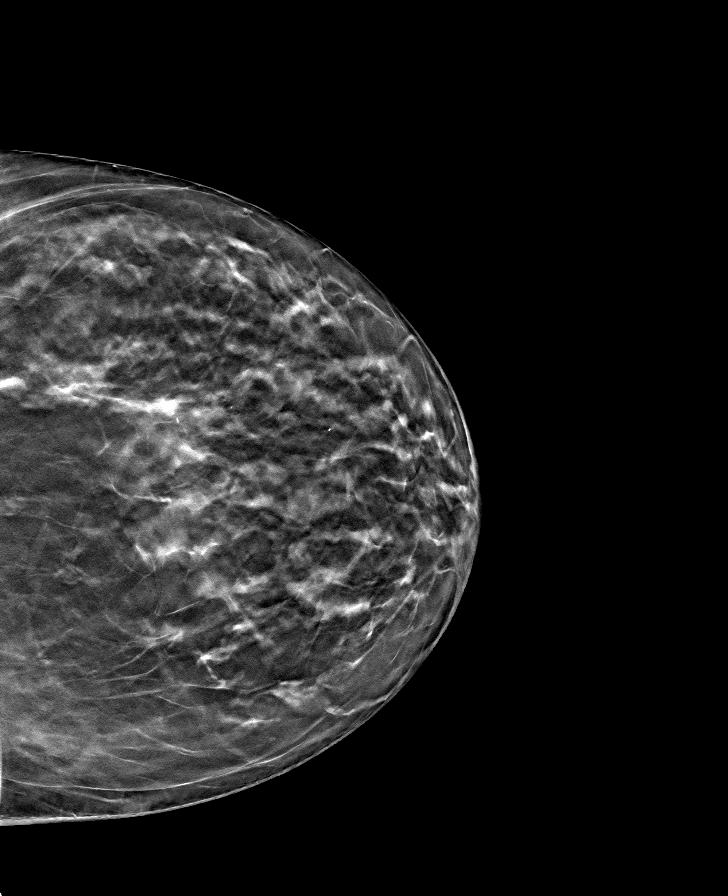

[L MLO tomo · tomo slice 39/78.0]
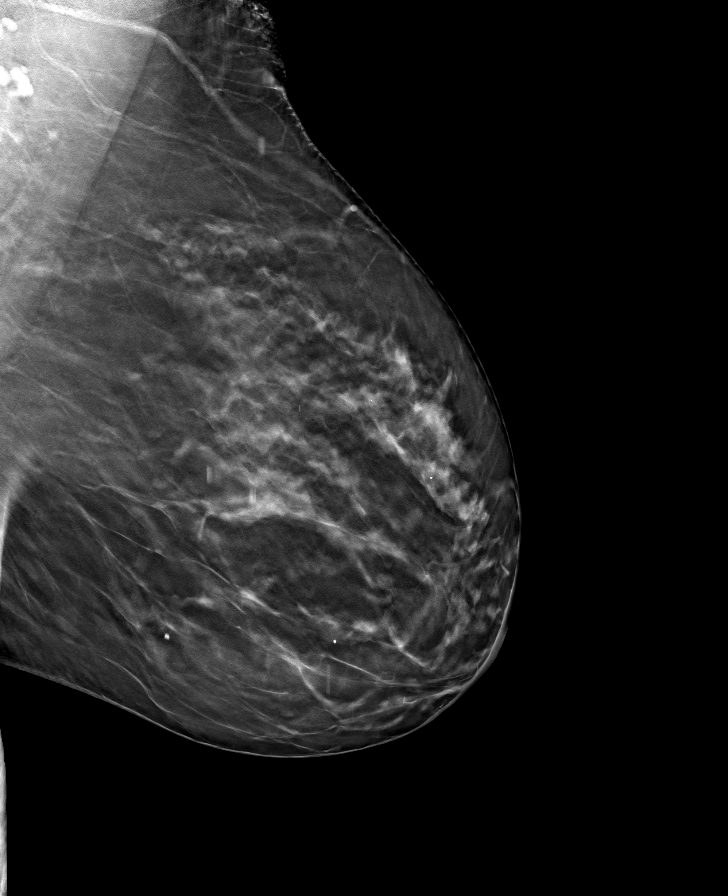

[R MLO tomo · tomo slice 37/73.0]
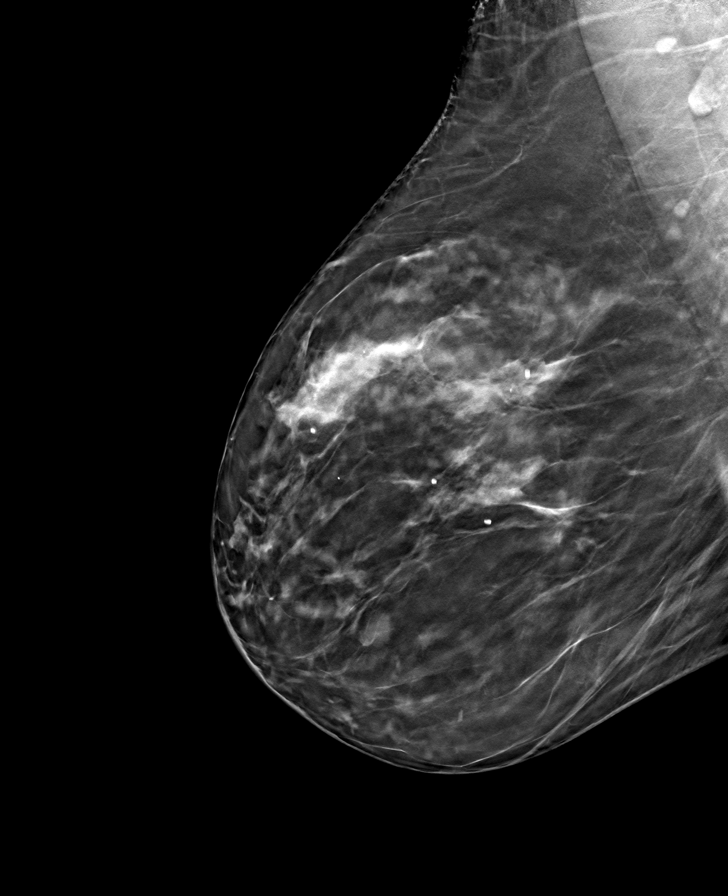

[R CC tomo · tomo slice 36/71.0]
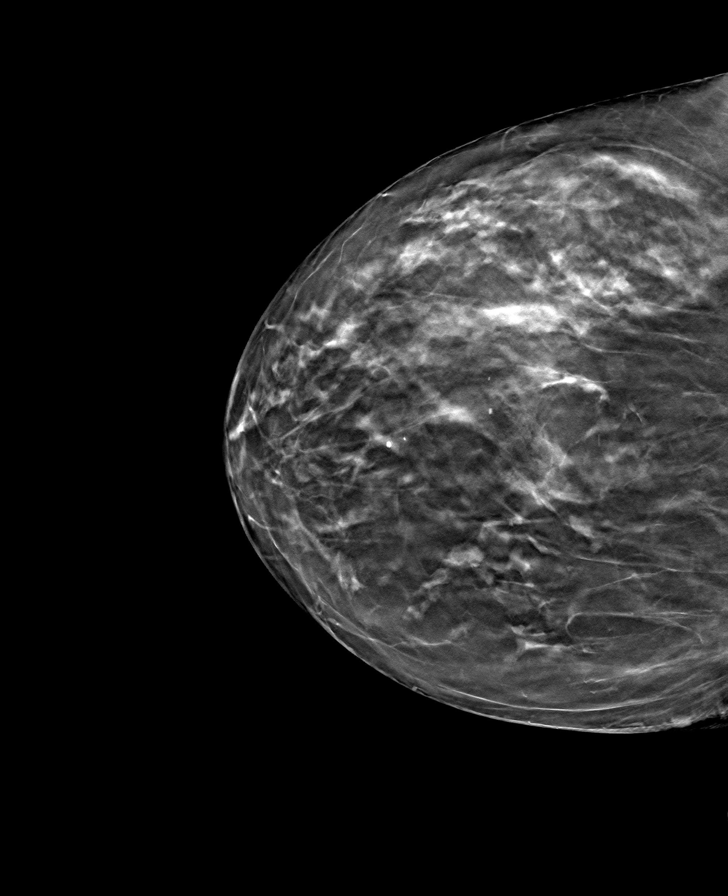

[8 of 24 positions shown; findings below may reference images not displayed]

ACR Breast Density Category c: The breast tissue is heterogeneously
dense, which may obscure small masses.
FINDINGS: There are no findings suspicious for malignancy.
IMPRESSION: No mammographic evidence of malignancy. A result letter of this
screening mammogram will be mailed directly to the patient.

RECOMMENDATION:
Screening mammogram in one year. (Code:Q3-W-BC3)

BI-RADS CATEGORY  1: Negative.

## 2023-06-29 ENCOUNTER — Other Ambulatory Visit: Payer: Self-pay

## 2023-06-29 DIAGNOSIS — Z87440 Personal history of urinary (tract) infections: Secondary | ICD-10-CM

## 2023-06-30 DIAGNOSIS — M5432 Sciatica, left side: Secondary | ICD-10-CM | POA: Diagnosis not present

## 2023-06-30 DIAGNOSIS — N3001 Acute cystitis with hematuria: Secondary | ICD-10-CM | POA: Diagnosis not present

## 2023-06-30 DIAGNOSIS — R309 Painful micturition, unspecified: Secondary | ICD-10-CM | POA: Diagnosis not present

## 2023-07-02 ENCOUNTER — Telehealth: Payer: Self-pay | Admitting: Nurse Practitioner

## 2023-07-02 DIAGNOSIS — K631 Perforation of intestine (nontraumatic): Secondary | ICD-10-CM | POA: Diagnosis not present

## 2023-07-02 DIAGNOSIS — Z932 Ileostomy status: Secondary | ICD-10-CM | POA: Diagnosis not present

## 2023-07-02 DIAGNOSIS — Z933 Colostomy status: Secondary | ICD-10-CM | POA: Diagnosis not present

## 2023-07-02 NOTE — Telephone Encounter (Signed)
 Done   Copied from CRM [id number]. Topic: Medical Record Request - Records Request >> Jul 02, 2023  3:41 PM Higinio Roger wrote: Reason for CRM: Patient went to [facility] on 06/30/23 and wanted to know if the specimen results could be collected from them or does she need to come in and do another one.  Patient callback #: [phone]

## 2023-07-12 ENCOUNTER — Other Ambulatory Visit: Payer: Self-pay | Admitting: Nurse Practitioner

## 2023-07-12 ENCOUNTER — Other Ambulatory Visit

## 2023-07-12 DIAGNOSIS — N3000 Acute cystitis without hematuria: Secondary | ICD-10-CM

## 2023-07-12 DIAGNOSIS — Z87440 Personal history of urinary (tract) infections: Secondary | ICD-10-CM

## 2023-07-12 DIAGNOSIS — I10 Essential (primary) hypertension: Secondary | ICD-10-CM | POA: Diagnosis not present

## 2023-07-12 DIAGNOSIS — E785 Hyperlipidemia, unspecified: Secondary | ICD-10-CM

## 2023-07-12 LAB — MICROSCOPIC EXAMINATION
RBC, Urine: >30 /HPF — AB (ref 0–2)
Renal Epithel, UA: NONE SEEN /HPF
WBC, UA: >30 /HPF — AB (ref 0–5)
Yeast, UA: NONE SEEN

## 2023-07-12 LAB — URINALYSIS, COMPLETE
Bilirubin, UA: NEGATIVE
Glucose, UA: NEGATIVE
Ketones, UA: NEGATIVE
Nitrite, UA: POSITIVE — AB
Specific Gravity, UA: 1.01 (ref 1.005–1.030)
Urobilinogen, Ur: 0.2 mg/dL (ref 0.2–1.0)
pH, UA: 6 (ref 5.0–7.5)

## 2023-07-12 LAB — LIPID PANEL

## 2023-07-12 MED ORDER — DOXYCYCLINE HYCLATE 100 MG PO TABS: 100.0000 mg | ORAL_TABLET | Freq: Two times a day (BID) | ORAL | 0 refills | Status: DC

## 2023-07-12 NOTE — Progress Notes (Signed)
 Meds ordered this encounter  Medications   doxycycline (VIBRA-TABS) 100 MG tablet    Sig: Take 1 tablet (100 mg total) by mouth 2 (two) times daily. 1 po bid    Dispense:  20 tablet    Refill:  0    Supervising Provider:   Nils Pyle [[id number]]    Culture pending  Mary-Margaret Daphine Deutscher, FNP

## 2023-07-13 LAB — CMP14+EGFR
ALT: 15 IU/L (ref 0–32)
AST: 17 IU/L (ref 0–40)
Albumin: 3.9 g/dL (ref 3.8–4.8)
Alkaline Phosphatase: 122 IU/L — ABNORMAL HIGH (ref 44–121)
BUN/Creatinine Ratio: 11 — ABNORMAL LOW (ref 12–28)
BUN: 15 mg/dL (ref 8–27)
Bilirubin Total: 0.2 mg/dL (ref 0.0–1.2)
CO2: 21 mmol/L (ref 20–29)
Calcium: 9.2 mg/dL (ref 8.7–10.3)
Chloride: 100 mmol/L (ref 96–106)
Creatinine, Ser: 1.36 mg/dL — ABNORMAL HIGH (ref 0.57–1.00)
Globulin, Total: 2.6 g/dL (ref 1.5–4.5)
Glucose: 104 mg/dL — ABNORMAL HIGH (ref 70–99)
Potassium: 4.3 mmol/L (ref 3.5–5.2)
Sodium: 136 mmol/L (ref 134–144)
Total Protein: 6.5 g/dL (ref 6.0–8.5)
eGFR: 40 mL/min/{1.73_m2} — ABNORMAL LOW (ref >59)

## 2023-07-13 LAB — CBC WITH DIFFERENTIAL/PLATELET
Basophils Absolute: 0.1 10*3/uL (ref 0.0–0.2)
Basos: 1 %
EOS (ABSOLUTE): 0.3 10*3/uL (ref 0.0–0.4)
Eos: 3 %
Hematocrit: 33 % — ABNORMAL LOW (ref 34.0–46.6)
Hemoglobin: 10.2 g/dL — ABNORMAL LOW (ref 11.1–15.9)
Immature Grans (Abs): 0 10*3/uL (ref 0.0–0.1)
Immature Granulocytes: 0 %
Lymphocytes Absolute: 2.2 10*3/uL (ref 0.7–3.1)
Lymphs: 28 %
MCH: 25.2 pg — ABNORMAL LOW (ref 26.6–33.0)
MCHC: 30.9 g/dL — ABNORMAL LOW (ref 31.5–35.7)
MCV: 82 fL (ref 79–97)
Monocytes Absolute: 0.5 10*3/uL (ref 0.1–0.9)
Monocytes: 6 %
Neutrophils Absolute: 5 10*3/uL (ref 1.4–7.0)
Neutrophils: 62 %
Platelets: 233 10*3/uL (ref 150–450)
RBC: 4.04 x10E6/uL (ref 3.77–5.28)
RDW: 13.5 % (ref 11.7–15.4)
WBC: 8.1 10*3/uL (ref 3.4–10.8)

## 2023-07-13 LAB — LIPID PANEL
Cholesterol, Total: 168 mg/dL (ref 100–199)
HDL: 57 mg/dL (ref >39)
LDL CALC COMMENT:: 2.9 ratio (ref 0.0–4.4)
LDL Chol Calc (NIH): 90 mg/dL (ref 0–99)
Triglycerides: 119 mg/dL (ref 0–149)
VLDL Cholesterol Cal: 21 mg/dL (ref 5–40)

## 2023-07-15 LAB — URINE CULTURE

## 2023-07-16 ENCOUNTER — Telehealth: Payer: Self-pay

## 2023-07-16 MED ORDER — CIPROFLOXACIN HCL 500 MG PO TABS: 500.0000 mg | ORAL_TABLET | Freq: Two times a day (BID) | ORAL | 0 refills | Status: DC

## 2023-07-16 NOTE — Telephone Encounter (Signed)
 Stop doxycycline and start Cipro per urine culture.

## 2023-07-16 NOTE — Telephone Encounter (Signed)
 Patient aware and verbalizes understanding.

## 2023-07-16 NOTE — Telephone Encounter (Signed)
 Copied from CRM [id number]. Topic: Clinical - Prescription Issue >> Jul 16, 2023  9:17 AM Sheri Russell wrote: Reason for CRM: Patient called and stated that provider Carolann Chum called her in a prescription for doxycycline (VIBRA-TABS) 100 MG tablet. Patient stated she is unable to take this medication and would need an alternate medication. Please contact patient back at [phone] as soon as possible.

## 2023-07-17 ENCOUNTER — Ambulatory Visit: Payer: HMO | Admitting: Nurse Practitioner

## 2023-07-24 ENCOUNTER — Ambulatory Visit: Payer: HMO | Admitting: Nurse Practitioner

## 2023-07-24 DIAGNOSIS — N1330 Unspecified hydronephrosis: Secondary | ICD-10-CM | POA: Diagnosis not present

## 2023-07-24 DIAGNOSIS — C763 Malignant neoplasm of pelvis: Secondary | ICD-10-CM | POA: Diagnosis not present

## 2023-07-24 DIAGNOSIS — N390 Urinary tract infection, site not specified: Secondary | ICD-10-CM | POA: Diagnosis not present

## 2023-07-24 DIAGNOSIS — N3090 Cystitis, unspecified without hematuria: Secondary | ICD-10-CM | POA: Diagnosis not present

## 2023-07-27 ENCOUNTER — Ambulatory Visit: Admitting: Nurse Practitioner

## 2023-07-27 ENCOUNTER — Encounter: Payer: Self-pay | Admitting: Nurse Practitioner

## 2023-07-27 ENCOUNTER — Ambulatory Visit (INDEPENDENT_AMBULATORY_CARE_PROVIDER_SITE_OTHER): Admitting: Nurse Practitioner

## 2023-07-27 DIAGNOSIS — N3281 Overactive bladder: Secondary | ICD-10-CM

## 2023-07-27 DIAGNOSIS — I10 Essential (primary) hypertension: Secondary | ICD-10-CM

## 2023-07-27 MED ORDER — OXYBUTYNIN CHLORIDE 5 MG PO TABS: 5.0000 mg | ORAL_TABLET | Freq: Every day | ORAL | 1 refills | Status: DC

## 2023-07-27 MED ORDER — ENALAPRIL MALEATE 20 MG PO TABS: 20.0000 mg | ORAL_TABLET | Freq: Two times a day (BID) | ORAL | 1 refills | Status: DC

## 2023-07-27 NOTE — Patient Instructions (Signed)
 Percutaneous Nephrostomy Home Guide Percutaneous nephrostomy is a procedure to insert a tube into your kidney. This is done to help urine leave your body. The nephrostomy tube is inserted into your back and connected to a drainage bag outside your body. Urine collects in the drainage bag. You will need to empty and change the drainage bag. You will also need to care for the area where the tube was inserted (tube insertion site). How to empty the drainage bag Empty the leg bag or bedside drainage bag when it becomes ? full. Also, empty it before you go to sleep. Most of the bags have a drain valve at the bottom. This allows urine to be emptied. Follow these steps: Hold the drainage bag over a toilet or collection container. Use a measuring container if your health care provider told you to measure your urine. Open the drain valve of the bag. Allow urine to drain out. After all the urine has drained, close the drain valve fully. Flush urine down the toilet. If you used a collection container, rinse the container. How to change the dressing around the nephrostomy tube Change your bandage (dressing) and clean your tube insertion site as told by your health care provider. You may need to change the dressing every day for the first 2 weeks. After the first 2 weeks, you may be told to change it twice a week. Supplies needed: Mild soap and water. Split gauze pads, 4 x 4 inches (10 x 10 cm). Gauze pads, 4 x 4 inches (10 x 10 cm). Paper tape. Follow these steps:  Wash hands with soap and water for at least 20 seconds. Gently remove the tape and dressing from around the tube. Do not pull on the tube when you take off the dressing. Avoid using scissors. These may damage the tube. Wash the skin around the tube with soap and water. Rinse well and pat the skin dry. Check the skin around the drain for signs of infection. Check for: Redness, swelling, or pain. More fluid or blood. Warmth. Pus or a bad  smell. If the drain was stitched (sutured) to the skin, check to make sure the suture is still in place. Place two split gauze pads in and around the tube insertion site. Do not apply ointments or alcohol to the site. Place a gauze pad on top of the split gauze pad. Make sure the tubing rests on the gauze, not the skin. Place tape around each edge of the gauze pad. Secure the tubing. Make sure the tube does not kink or become pinched. Dispose of used supplies in the right way. How to flush the nephrostomy tube Flushing should only be done as told by your health care provider. It is easier to do if a three-way stopcock has been placed. One part of the stopcock connects to your tube, one part connects to the drainage bag, and one part is covered with a cap. The lever on the stopcock points to the part that is closed to flow. Normally, it points to the part that is covered with the cap. Supplies needed: Alcohol wipe. Syringes pre-filled with a solution for flushing. Sterile cap. Follow these steps: Move the lever of the three-way stopcock so it points toward the drainage bag. Remove the cap from the stopcock port. Clean the stopcock port with an alcohol wipe. Scrub the port for 5-15 seconds. Screw the tip of the flush syringe into the stopcock port. Over 5-10 seconds, use the syringe plunger to slowly  push 5-10 mL, or the amount of flush solution told by your health care provider. If you feel something resist or if it hurts when you push, stop pushing the flush. Remove the syringe from the stopcock port. Move the lever so it points in the direction of the stopcock port. Cover stopcock port with a new sterile cap. Dispose of used supplies in the right way. How to replace the drainage bag Replace the drainage bag, three-way stopcock, and extension tubing as told by your health care provider. Follow these steps: Empty urine from the drainage bag. Gather a new drainage bag, three-way stopcock,  and extension tubing. Remove the drainage bag, stopcock, and tubing from the nephrostomy tube. Attach the new drainage bag, stopcock, and tubing. Dispose of the used drainage bag, stopcock, and tubing. General recommendations  When you connect your tube to a drainage bag, make sure there are no kinks in the tubing. You may want to wrap an elastic bandage over the tubing. This will help keep it in place. Make sure there is no tension on the tubing, so it does not get pulled on. At night, connect your tube or the leg bag to a larger bedside drainage bag. Always keep the tubing, the leg bag, and the bedside drainage bag below the level of your kidney. Avoid activities that may cause your tube to bend. Because of where your tube is, you may need help from a second person to do your dressing changes. Make sure you always have an extra drainage bag and connecting tubing on hand. Contact a health care provider if: You have problems with any of the drain valves or tubing. You have back pain that does not get better with medicine. You have any signs of infection around your tube insertion site. You have a fever or chills. You make more urine than normal, or it burns when you urinate. You make less urine than normal. You have new blood in your urine. Get help right away if: You have chest pain or trouble breathing. Your tube comes out. These symptoms may be an emergency. Get help right away. Call 911. Do not wait to see if the symptoms will go away. Do not drive yourself to the hospital. This information is not intended to replace advice given to you by your health care provider. Make sure you discuss any questions you have with your health care provider. Document Revised: 10/06/2021 Document Reviewed: 10/06/2021 Elsevier Patient Education  2024 [employer].

## 2023-07-27 NOTE — Progress Notes (Signed)
 Subjective:    Patient ID: Sheri Russell, female    DOB: [birth year], 78 y.o.   MRN: [id number]   Chief Complaint: medical management of chronic issues     HPI:  Sheri Russell is a 78 y.o. who identifies as a female who was assigned female at birth.   Social history: Lives with: by herself Work history: retired   Water engineer in today for follow up of the following chronic medical issues:  1. Essential hypertension, benign No c/o chest pain, sob or headache. Does not check blood pressure at home. BP Readings from Last 3 Encounters:  07/27/23 122/77  06/19/23 106/71  05/11/23 131/84     2. Hyperlipidemia with target LDL less than 100 Does wathc diet and exercises several times a week. Lab Results  Component Value Date   CHOL 168 07/12/2023   HDL 57 07/12/2023   LDLCALC 90 07/12/2023   TRIG 119 07/12/2023   CHOLHDL 2.9 07/12/2023     3. Osteopenia of lumbar spine Last dexascan was done on 07/25/21. 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 She was having hydronephrosis and has had to had have a tube put in kidney to drain it. She has refused surgery. She has also decided to do no treatments.   Allergies  Allergen Reactions   Fish Allergy    Iohexol     IVP Dye    Iodinated Contrast Media Rash   Other Hives and Rash    Seafood perch, red snapper any seafood with iodine   Outpatient Encounter Medications as of 07/27/2023  Medication Sig   enalapril  (VASOTEC ) 20 MG tablet Take 1 tablet (20 mg total) by mouth 2 (two) times daily.   meclizine  (ANTIVERT ) 25 MG tablet TAKE 1 TABLET BY MOUTH THREE TIMES DAILY AS NEEDED FOR DIZZINESS   ondansetron  (ZOFRAN ) 4 MG tablet TAKE 1 TABLET BY MOUTH EVERY 8 HOURS AS NEEDED FOR NAUSEA AND VOMITING   oxybutynin  (DITROPAN ) 5 MG tablet Take 1 tablet (5 mg total) by mouth at bedtime.   VENTOLIN  HFA 108 (90 Base) MCG/ACT  inhaler INHALE 2 PUFFS BY MOUTH EVERY 6 HOURS AS NEEDED FOR WHEEZING AND FOR SHORTNESS OF BREATH   ipratropium (ATROVENT) 0.06 % nasal spray 2 sprays into each nostril Three (3) times a day.   [DISCONTINUED] ciprofloxacin  (CIPRO ) 500 MG tablet Take 1 tablet (500 mg total) by mouth 2 (two) times daily.   [DISCONTINUED] doxycycline  (VIBRA -TABS) 100 MG tablet Take 1 tablet (100 mg total) by mouth 2 (two) times daily. 1 po bid   No facility-administered encounter medications on file as of 07/27/2023.    Past Surgical History:  Procedure Laterality Date   APPENDECTOMY  1986   CHOLECYSTECTOMY  1985   colostomy bag     Several cancer surgeries  1971, 1985, & 1986   TOTAL ABDOMINAL HYSTERECTOMY  1986    Family History  Problem Relation Age of Onset   Diabetes Mother    Hypertension Mother    Heart attack Mother    Heart disease Mother    Diabetes Father    Hypertension Father    Heart attack Father    Heart disease Father    Diabetes Maternal Grandmother    Hypertension Maternal Grandmother    Heart attack Maternal Grandmother    Diabetes Maternal Grandfather    Hypertension Maternal Grandfather  Heart attack Maternal Grandfather    Diabetes Paternal Grandmother    Hypertension Paternal Grandmother    Heart attack Paternal Grandmother    Diabetes Paternal Grandfather    Hypertension Paternal Grandfather    Heart attack Paternal Grandfather    Stroke Brother    Hypertension Brother    Asthma Brother    Liver cancer Brother    Alcohol abuse Brother    Cancer Sister        colon cancer    Diabetes Sister    Diabetes Sister    Diabetes Sister    Hypertension Brother    Diabetes Brother       Controlled substance contract: n/a     Review of Systems  Constitutional:  Negative for diaphoresis.  Eyes:  Negative for pain.  Respiratory:  Negative for shortness of breath.   Cardiovascular:  Negative for chest pain, palpitations and leg swelling.  Gastrointestinal:   Negative for abdominal pain.  Endocrine: Negative for polydipsia.  Skin:  Negative for rash.  Neurological:  Negative for dizziness, weakness and headaches.  Hematological:  Does not bruise/bleed easily.  All other systems reviewed and are negative.      Objective:   Physical Exam Vitals and nursing note reviewed.  Constitutional:      General: She is not in acute distress.    Appearance: Normal appearance. She is well-developed.  HENT:     Head: Normocephalic.     Right Ear: Tympanic membrane normal.     Left Ear: Tympanic membrane normal.     Nose: Nose normal.     Mouth/Throat:     Mouth: Mucous membranes are moist.  Eyes:     Pupils: Pupils are equal, round, and reactive to light.  Neck:     Vascular: No carotid bruit or JVD.  Cardiovascular:     Rate and Rhythm: Normal rate and regular rhythm.     Heart sounds: Normal heart sounds.  Pulmonary:     Effort: Pulmonary effort is normal. No respiratory distress.     Breath sounds: Normal breath sounds. No wheezing or rales.  Chest:     Chest wall: No tenderness.  Abdominal:     General: Bowel sounds are normal. There is no distension or abdominal bruit.     Palpations: Abdomen is soft. There is no hepatomegaly, splenomegaly, mass or pulsatile mass.     Tenderness: There is no abdominal tenderness.  Genitourinary:    Comments: Left nephrostomy tube- insertion clear Colostomy stump pink Musculoskeletal:        General: Normal range of motion.     Cervical back: Normal range of motion and neck supple.  Lymphadenopathy:     Cervical: No cervical adenopathy.  Skin:    General: Skin is warm and dry.  Neurological:     Mental Status: She is alert and oriented to person, place, and time.     Deep Tendon Reflexes: Reflexes are normal and symmetric.  Psychiatric:        Behavior: Behavior normal.        Thought Content: Thought content normal.        Judgment: Judgment normal.     BP 122/77   Pulse 93   Temp 98.3 F  (36.8 C) (Temporal)   Ht 5\' 5"  (1.651 m)   Wt 136 lb (61.7 kg)   LMP 07/09/1975   SpO2 97%   BMI 22.63 kg/m        Assessment & Plan:  Sheri Russell comes in today with chief complaint of Medical Management of Chronic Issues   Diagnosis and orders addressed:  1. Essential hypertension, benign Low sodium diet - enalapril  (VASOTEC ) 20 MG tablet; Take 1 tablet (20 mg total) by mouth 2 (two) times daily.  Dispense: 180 tablet; Refill: 1 - CBC with Differential/Platelet - CMP14+EGFR  2. Hyperlipidemia with target LDL less than 100 Low fta diet - Lipid panel  3. Osteopenia of lumbar spine Weight bearing exercises  4. Overactive bladder - oxybutynin  (DITROPAN ) 5 MG tablet; Take 1 tablet (5 mg total) by mouth at bedtime.  Dispense: 90 tablet; Refill: 1  Keep follow  up with oncology Labs pending Health Maintenance reviewed Diet and exercise encouraged  Follow up plan: 6 months   Mary-Margaret Gaylyn Keas, FNP

## 2023-08-01 ENCOUNTER — Telehealth: Payer: Self-pay

## 2023-08-01 NOTE — Telephone Encounter (Signed)
 Copied from CRM [id number]. Topic: Clinical - Medication Question >> Aug 01, 2023  8:52 AM Retta Caster wrote: Reason for CRM: Patient daughter Brian Campanile called due to mother has cancer and is not eating as much. Requesting if thru the insurance they can get a nutritious drink for her since she is not eating as much. Needs call back on update. [phone] or [phone]

## 2023-08-02 MED ORDER — ENSURE MAX PROTEIN PO LIQD: 11.0000 [oz_av] | Freq: Every day | ORAL | 99 refills | Status: DC

## 2023-08-02 NOTE — Telephone Encounter (Signed)
Please see previous note for patient

## 2023-08-02 NOTE — Addendum Note (Signed)
 Addended by: Raylene Carmickle, MARY-MARGARET on: 08/02/2023 01:57 PM   Modules accepted: Orders

## 2023-08-14 DIAGNOSIS — C679 Malignant neoplasm of bladder, unspecified: Secondary | ICD-10-CM | POA: Diagnosis not present

## 2023-08-14 DIAGNOSIS — Z436 Encounter for attention to other artificial openings of urinary tract: Secondary | ICD-10-CM | POA: Diagnosis not present

## 2023-08-14 DIAGNOSIS — N1330 Unspecified hydronephrosis: Secondary | ICD-10-CM | POA: Diagnosis not present

## 2023-08-14 DIAGNOSIS — N135 Crossing vessel and stricture of ureter without hydronephrosis: Secondary | ICD-10-CM | POA: Diagnosis not present

## 2023-08-17 ENCOUNTER — Ambulatory Visit: Payer: HMO

## 2023-08-17 VITALS — BP 140/89 | Ht 65.0 in | Wt 138.0 lb

## 2023-08-17 DIAGNOSIS — C52 Malignant neoplasm of vagina: Secondary | ICD-10-CM

## 2023-08-17 DIAGNOSIS — Z0001 Encounter for general adult medical examination with abnormal findings: Secondary | ICD-10-CM

## 2023-08-17 DIAGNOSIS — Z5320 Procedure and treatment not carried out because of patient's decision for unspecified reasons: Secondary | ICD-10-CM

## 2023-08-17 DIAGNOSIS — Z0000 Encounter for general adult medical examination without abnormal findings: Secondary | ICD-10-CM

## 2023-08-17 DIAGNOSIS — Z933 Colostomy status: Secondary | ICD-10-CM

## 2023-08-17 NOTE — Progress Notes (Signed)
 Subjective:   Sheri Russell is a 78 y.o. who presents for a Medicare Wellness preventive visit.  As a reminder, Annual Wellness Visits don't include a physical exam, and some assessments may be limited, especially if this visit is performed virtually. We may recommend an in-person follow-up visit with your provider if needed.  Visit Complete: Virtual I connected with  [name] on 08/17/23 by a audio enabled telemedicine application and verified that I am speaking with the correct person using two identifiers.  Patient Location: Home  Provider Location: Home Office  I discussed the limitations of evaluation and management by telemedicine. The patient expressed understanding and agreed to proceed.  Vital Signs: Because this visit was a virtual/telehealth visit, some criteria may be missing or patient reported. Any vitals not documented were not able to be obtained and vitals that have been documented are patient reported.  VideoDeclined- This patient declined Librarian, academic. Therefore the visit was completed with audio only.  Persons Participating in Visit: Patient.  AWV Questionnaire: No: Patient Medicare AWV questionnaire was not completed prior to this visit.  Cardiac Risk Factors include: advanced age (>37men, >5 women);hypertension     Objective:     Today's Vitals   08/17/23 1358  BP: (!) 140/89  Weight: 138 lb (62.6 kg)  Height: 5\' 5"  (1.651 m)  PainSc: 8   PainLoc: Leg   Body mass index is 22.96 kg/m.     08/17/2023    2:22 PM 08/07/2022   10:31 AM 08/01/2021   10:37 AM 07/23/2019    9:39 AM 01/14/2018    2:48 PM 01/08/2017    3:17 PM 01/08/2017    2:38 PM  Advanced Directives  Does Patient Have a Medical Advance Directive? Yes Yes Yes Yes Yes  No  Type of Estate agent of [place];Living will Healthcare Power of [place];Living will Healthcare Power of [place];Living will Healthcare Power of [place];Living  will Living will;Healthcare Power of [employer] Power of [place];Living will   Does patient want to make changes to medical advance directive? No - Patient declined   No - Patient declined Yes (MAU/Ambulatory/Procedural Areas - Information given)    Copy of Healthcare Power of Attorney in Chart? Yes - validated most recent copy scanned in chart (See row information) No - copy requested No - copy requested No - copy requested No - copy requested No - copy requested     Current Medications (verified) Outpatient Encounter Medications as of 08/17/2023  Medication Sig   enalapril  (VASOTEC ) 20 MG tablet Take 1 tablet (20 mg total) by mouth 2 (two) times daily.   Ensure Max Protein (ENSURE MAX PROTEIN) LIQD Take 330 mLs (11 oz total) by mouth daily.   ipratropium (ATROVENT) 0.06 % nasal spray 2 sprays into each nostril Three (3) times a day.   meclizine  (ANTIVERT ) 25 MG tablet TAKE 1 TABLET BY MOUTH THREE TIMES DAILY AS NEEDED FOR DIZZINESS   ondansetron  (ZOFRAN ) 4 MG tablet TAKE 1 TABLET BY MOUTH EVERY 8 HOURS AS NEEDED FOR NAUSEA AND VOMITING   oxybutynin  (DITROPAN ) 5 MG tablet Take 1 tablet (5 mg total) by mouth at bedtime.   VENTOLIN  HFA 108 (90 Base) MCG/ACT inhaler INHALE 2 PUFFS BY MOUTH EVERY 6 HOURS AS NEEDED FOR WHEEZING AND FOR SHORTNESS OF BREATH   No facility-administered encounter medications on file as of 08/17/2023.    Allergies (verified) Fish allergy, Iohexol, Iodinated contrast media, and Other   History: Past Medical History:  Diagnosis Date   Allergic rhinitis    Anxiety    Asthma    Bladder cancer (HCC) 1986   Cancer [facility]) 1971   pelvic - oncologist -Dr. Gerrie Krebs [facility] )   Cervical cancer [facility])    Colon polyps    Hyperlipidemia    Hypertension    Past Surgical History:  Procedure Laterality Date   APPENDECTOMY  1986   CHOLECYSTECTOMY  1985   colostomy bag     Several cancer surgeries  1971, 1985, & 1986   TOTAL ABDOMINAL HYSTERECTOMY   1986   Family History  Problem Relation Age of Onset   Diabetes Mother    Hypertension Mother    Heart attack Mother    Heart disease Mother    Diabetes Father    Hypertension Father    Heart attack Father    Heart disease Father    Diabetes Maternal Grandmother    Hypertension Maternal Grandmother    Heart attack Maternal Grandmother    Diabetes Maternal Grandfather    Hypertension Maternal Grandfather    Heart attack Maternal Grandfather    Diabetes Paternal Grandmother    Hypertension Paternal Grandmother    Heart attack Paternal Grandmother    Diabetes Paternal Grandfather    Hypertension Paternal Grandfather    Heart attack Paternal Grandfather    Stroke Brother    Hypertension Brother    Asthma Brother    Liver cancer Brother    Alcohol abuse Brother    Cancer Sister        colon cancer    Diabetes Sister    Diabetes Sister    Diabetes Sister    Hypertension Brother    Diabetes Brother    Social History   Socioeconomic History   Marital status: Widowed    Spouse name: Not on file   Number of children: 1   Years of education: 8   Highest education level: 8th grade  Occupational History   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  Tobacco Use   Smoking status: Former    Current packs/day: 0.00    Types: Cigarettes    Quit date: 1978    Years since quitting: 47.4   Smokeless tobacco: Never  Vaping Use   Vaping status: Never Used  Substance and Sexual Activity   Alcohol use: Yes    Alcohol/week: 1.0 standard drink of alcohol    Types: 1 Glasses of wine per week    Comment: occasionally    Drug use: No   Sexual activity: Not Currently  Other Topics Concern   Not on file  Social History Narrative   Lives alone    Daughter in [place]   1 grandchild    Social Drivers of Health   Financial Resource Strain: Low Risk  (08/17/2023)   Overall Financial Resource Strain (CARDIA)    Difficulty of Paying Living Expenses: Not hard at  all  Food Insecurity: No Food Insecurity (08/17/2023)   Hunger Vital Sign    Worried About Running Out of Food in the Last Year: Never true    Ran Out of Food in the Last Year: Never true  Transportation Needs: No Transportation Needs (08/17/2023)   PRAPARE - Administrator, Civil Russell (Medical): No    Lack of Transportation (Non-Medical): No  Physical Activity: Insufficiently Active (08/17/2023)   Exercise Vital Sign    Days of Exercise per Week: 4 days    Minutes  of Exercise per Session: 30 min  Stress: No Stress Concern Present (08/17/2023)   [employer] of Occupational Health - Occupational Stress Questionnaire    Feeling of Stress : Not at all  Social Connections: Socially Isolated (08/17/2023)   Social Connection and Isolation Panel [NHANES]    Frequency of Communication with Friends and Family: More than three times a week    Frequency of Social Gatherings with Friends and Family: More than three times a week    Attends Religious Services: Never    Database administrator or Organizations: No    Attends Banker Meetings: Never    Marital Status: Widowed    Tobacco Counseling Counseling given: Yes    Clinical Intake:  Pre-visit preparation completed: Yes  Pain : 0-10 Pain Score: 8  Pain Type: Chronic pain Pain Location: Leg (bilateral) Pain Orientation: Right, Left Pain Radiating Towards: calf feets Pain Descriptors / Indicators: Aching, Constant Pain Onset: More than a month ago Pain Frequency: Constant Pain Relieving Factors: getting up and walking  Pain Relieving Factors: getting up and walking  BMI - recorded: 22.96 Nutritional Status: BMI of 19-24  Normal Nutritional Risks: None Diabetes: No  No results found for: "HGBA1C"   How often do you need to have someone help you when you read instructions, pamphlets, or other written materials from your doctor or pharmacy?: 1 - Never  Interpreter Needed?: No  Information entered  by :: Rockne Chyle W CMA   Activities of Daily Living     08/17/2023    2:16 PM  In your present state of health, do you have any difficulty performing the following activities:  Hearing? 0  Vision? 0  Difficulty concentrating or making decisions? 0  Walking or climbing stairs? 1  Comment has trouble due to leg pain  Dressing or bathing? 0  Doing errands, shopping? 0  Preparing Food and eating ? N  Using the Toilet? N  In the past six months, have you accidently leaked urine? Y  Do you have problems with loss of bowel control? 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  Managing your Medications? N  Managing your Finances? N  Housekeeping or managing your Housekeeping? N    Patient Care Team: Delfina Feller, FNP as PCP - General (Nurse Practitioner) Dr [facility], OD (Optometry) Gaile Jourdain, Larene Pleasant, LCSW as Social Worker (Licensed Clinical Social Worker) Allen Archer, NP as Nurse Practitioner (Oncology) Nicolette Barrio, MD (Obstetrics)  Indicate any recent Medical Services you may have received from other than [facility] providers in the past year (date may be approximate).     Assessment:    This is a routine wellness examination for Zhanee.  Hearing/Vision screen Hearing Screening - Comments:: Patient denies any hearing difficulties.   Vision Screening - Comments:: Wears rx glasses - up to date with routine eye exams  Patient sees Dr. Patria Bookbinder    Goals Addressed             This Visit's Progress    Patient Stated       I want to go on beach trip with my daughter and grand daughter as long as I'm able to go.      Prevent falls   On track      Depression Screen     08/17/2023    2:23 PM 07/27/2023    3:24 PM 05/11/2023    8:54 AM 01/30/2023    2:09 PM 08/07/2022  10:30 AM 08/01/2022    2:11 PM 05/25/2022    2:53 PM  PHQ 2/9 Scores  PHQ - 2 Score 0 0 0 0 0 0 0  PHQ- 9 Score 5  1 0 0 0 0    Fall Risk     08/17/2023     2:20 PM 07/27/2023    3:24 PM 05/11/2023    8:53 AM 01/30/2023    2:09 PM 08/07/2022   10:29 AM  Fall Risk   Falls in the past year? 0 0 0 0 0  Number falls in past yr: 0  0  0  Injury with Fall? 0  0  0  Risk for fall due to : No Fall Risks  No Fall Risks  No Fall Risks  Follow up Falls prevention discussed;Falls evaluation completed  Falls evaluation completed  Falls prevention discussed    MEDICARE RISK AT HOME:  Medicare Risk at Home Any stairs in or around the home?: Yes If so, are there any without handrails?: No Home free of loose throw rugs in walkways, pet beds, electrical cords, etc?: Yes Adequate lighting in your home to reduce risk of falls?: Yes Life alert?: No Use of a cane, walker or w/c?: No Grab bars in the bathroom?: No Shower chair or bench in shower?: No Elevated toilet seat or a handicapped toilet?: Yes  TIMED UP AND GO:  Was the test performed?  No  Cognitive Function: 6CIT completed    01/14/2018    2:49 PM 01/08/2017    3:21 PM  MMSE - Mini Mental State Exam  Orientation to time 5 5  Orientation to Place 5 5  Registration 3 3  Attention/ Calculation 5 5  Recall 2 3  Language- name 2 objects 2 2  Language- repeat 1 1  Language- follow 3 step command 3 3  Language- read & follow direction 1 1  Write a sentence 1 1  Copy design 1 1  Total score 29 30        08/17/2023    2:21 PM 08/07/2022   10:31 AM 08/01/2021   10:43 AM 07/23/2020    9:29 AM 07/23/2019    9:41 AM  6CIT Screen  What Year? 0 points 0 points 0 points 0 points 0 points  What month? 0 points 0 points 0 points 0 points 0 points  What time? 0 points 0 points 0 points 0 points 0 points  Count back from 20 0 points 0 points 0 points 0 points 0 points  Months in reverse 0 points 0 points 2 points 0 points 0 points  Repeat phrase 0 points 0 points 0 points 2 points 0 points  Total Score 0 points 0 points 2 points 2 points 0 points    Immunizations Immunization History  Administered  Date(s) Administered   Fluad Quad(high Dose 65+) 01/19/2020, 01/20/2021, 01/25/2022   Fluad Trivalent(High Dose 65+) 01/30/2023   Influenza, High Dose Seasonal PF 01/31/2017, 01/14/2018, 12/13/2018   Influenza, Seasonal, Injecte, Preservative Fre 01/18/2010   Influenza,inj,Quad PF,6+ Mos 01/20/2013, 03/20/2016   Moderna SARS-COV2 Booster Vaccination 03/03/2020   Moderna Sars-Covid-2 Vaccination 05/29/2019, 06/27/2019   PFIZER(Purple Top)SARS-COV-2 Vaccination 03/03/2020   Pneumococcal Conjugate-13 07/29/2014   Pneumococcal Polysaccharide-23 04/18/2010, 03/20/2016   Td 11/07/2000   Tdap 05/26/2010, 07/23/2014    Screening Tests Health Maintenance  Topic Date Due   Zoster Vaccines- Shingrix (1 of 2) Never done   COVID-19 Vaccine (3 - Moderna risk series) 03/31/2020   DEXA  SCAN  07/26/2023   MAMMOGRAM  01/30/2024 (Originally 10/27/2021)   INFLUENZA VACCINE  11/02/2023   DTaP/Tdap/Td (4 - Td or Tdap) 07/22/2024   Medicare Annual Wellness (AWV)  08/16/2024   Pneumonia Vaccine 16+ Years old  Completed   Hepatitis C Screening  Completed   HPV VACCINES  Aged Out   Meningococcal B Vaccine  Aged Out    Health Maintenance  Health Maintenance Due  Topic Date Due   Zoster Vaccines- Shingrix (1 of 2) Never done   COVID-19 Vaccine (3 - Moderna risk series) 03/31/2020   DEXA SCAN  07/26/2023   Health Maintenance Items Addressed: Mammogram and Dexa declined by patient due to current health conditions and poor prognosis of cancer  Additional Screening:  Vision Screening: Recommended annual ophthalmology exams for early detection of glaucoma and other disorders of the eye.  Dental Screening: Recommended annual dental exams for proper oral hygiene  Community Resource Referral / Chronic Care Management: CRR required this visit?  Yes   CCM required this visit?  No   Plan:    I have personally reviewed and noted the following in the patient's chart:   Medical and social  history Use of alcohol, tobacco or illicit drugs  Current medications and supplements including opioid prescriptions. Patient is not currently taking opioid prescriptions. Functional ability and status Nutritional status Physical activity Advanced directives List of other physicians Hospitalizations, surgeries, and ER visits in previous 12 months Vitals Screenings to include cognitive, depression, and falls Referrals and appointments  In addition, I have reviewed and discussed with patient certain preventive protocols, quality metrics, and best practice recommendations. A written personalized care plan for preventive services as well as general preventive health recommendations were provided to patient.   Kimeka Badour, CMA   08/17/2023   After Visit Summary: (MyChart) Due to this being a telephonic visit, the after visit summary with patients personalized plan was offered to patient via MyChart   Notes: Please refer to Routing Comments.

## 2023-08-17 NOTE — Patient Instructions (Signed)
 Sheri Russell , Thank you for taking time out of your busy schedule to complete your Annual Wellness Visit with me. I enjoyed our conversation and look forward to speaking with you again next year. I, as well as your care team,  appreciate your ongoing commitment to your health goals. Please review the following plan we discussed and let me know if I can assist you in the future.  Your Game plan/ To Do List     Referrals: If you haven't heard from the office you've been referred to, please reach out to them at the phone number provided.  Because this visit was a virtual/telehealth visit,  certain criteria was not obtained, such a blood pressure, CBG if applicable, and timed get up and go. Any medications not marked as "taking" were not mentioned during the medication reconciliation part of the visit. Any vitals not documented were not able to be obtained due to this being a telehealth visit or patient was unable to self-report a recent blood pressure reading due to a lack of equipment at home via telehealth. Vitals that have been documented are verbally provided by the patient.   Follow up Visits:  Next Medicare AWV with our clinical staff: Aug 18, 2024 at 3:10 pm telephone visit     Have you seen your provider in the last 6 months (3 months if uncontrolled diabetes)? yes  Next Office Visit with your provider: February 19, 2024 at 10:15 am   Clinician Recommendations:    Aim for 30 minutes of exercise or brisk walking, 6-8 glasses of water, and 5 servings of fruits and vegetables each day.   I enjoyed our conversation today and look forward to talking with you again next year!! Have a wonderful and safe year. All the best, Ivis Nicolson      This is a list of the screening recommended for you and due dates:  Health Maintenance  Topic Date Due   Zoster (Shingles) Vaccine (1 of 2) Never done   COVID-19 Vaccine (3 - Moderna risk series) 03/31/2020   DEXA scan (bone density measurement)  07/26/2023    Mammogram  01/30/2024*   Flu Shot  11/02/2023   DTaP/Tdap/Td vaccine (4 - Td or Tdap) 07/22/2024   Medicare Annual Wellness Visit  08/16/2024   Pneumonia Vaccine  Completed   Hepatitis C Screening  Completed   HPV Vaccine  Aged Out   Meningitis B Vaccine  Aged Out  *Topic was postponed. The date shown is not the original due date.    Advanced directives: (In Chart) A copy of your advanced directives are scanned into your chart should your provider ever need it. Advance Care Planning is important because it:  [x]  Makes sure you receive the medical care that is consistent with your values, goals, and preferences  [x]  It provides guidance to your family and loved ones and reduces their decisional burden about whether or not they are making the right decisions based on your wishes.  Follow the link provided in your after visit summary or read over the paperwork we have mailed to you to help you started getting your Advance Directives in place. If you need assistance in completing these, please reach out to us  so that we can help you!  See attachments for Preventive Care and Fall Prevention Tips.   Understanding Your Risk for Falls  Millions of people have serious injuries from falls each year. It is important to understand your risk of falling. Talk with your health care  provider about your risk and what you can do to lower it. If you do have a serious fall, make sure to tell your provider. Falling once raises your risk of falling again. How can falls affect me? Serious injuries from falls are common. These include: Broken bones, such as hip fractures. Head injuries, such as traumatic brain injuries (TBI) or concussions. A fear of falling can cause you to avoid activities and stay at home. This can make your muscles weaker and raise your risk for a fall. What can increase my risk? There are a number of risk factors that increase your risk for falling. The more risk factors you have, the  higher your risk of falling. Serious injuries from a fall happen most often to people who are older than 78 years old. Teenagers and young adults ages 44-29 are also at higher risk. Common risk factors include: Weakness in the lower body. Being generally weak or confused due to long-term (chronic) illness. Dizziness or balance problems. Poor vision. Medicines that cause dizziness or drowsiness. These may include: Medicines for your blood pressure, heart, anxiety, insomnia, or swelling (edema). Pain medicines. Muscle relaxants. Other risk factors include: Drinking alcohol. Having had a fall in the past. Having foot pain or wearing improper footwear. Working at a dangerous job. Having any of the following in your home: Tripping hazards, such as floor clutter or loose rugs. Poor lighting. Pets. Having dementia or memory loss. What actions can I take to lower my risk of falling?     Physical activity Stay physically fit. Do strength and balance exercises. Consider taking a regular class to build strength and balance. Yoga and tai chi are good options. Vision Have your eyes checked every year and your prescription for glasses or contacts updated as needed. Shoes and walking aids Wear non-skid shoes. Wear shoes that have rubber soles and low heels. Do not wear high heels. Do not walk around the house in socks or slippers. Use a cane or walker as told by your provider. Home safety Attach secure railings on both sides of your stairs. Install grab bars for your bathtub, shower, and toilet. Use a non-skid mat in your bathtub or shower. Attach bath mats securely with double-sided, non-slip rug tape. Use good lighting in all rooms. Keep a flashlight near your bed. Make sure there is a clear path from your bed to the bathroom. Use night-lights. Do not use throw rugs. Make sure all carpeting is taped or tacked down securely. Remove all clutter from walkways and stairways, including  extension cords. Repair uneven or broken steps and floors. Avoid walking on icy or slippery surfaces. Walk on the grass instead of on icy or slick sidewalks. Use ice melter to get rid of ice on walkways in the winter. Use a cordless phone. Questions to ask your health care provider Can you help me check my risk for a fall? Do any of my medicines make me more likely to fall? Should I take a vitamin D supplement? What exercises can I do to improve my strength and balance? Should I make an appointment to have my vision checked? Do I need a bone density test to check for weak bones (osteoporosis)? Would it help to use a cane or a walker? Where to find more information Centers for Disease Control and Prevention, STEADI: TonerPromos.no Community-Based Fall Prevention Programs: TonerPromos.no [employer] on Aging: BaseRingTones.pl Contact a health care provider if: You fall at home. You are afraid of falling at home. You  feel weak, drowsy, or dizzy. This information is not intended to replace advice given to you by your health care provider. Make sure you discuss any questions you have with your health care provider. Document Revised: 11/21/2021 Document Reviewed: 11/21/2021 Elsevier Patient Education  2024 [employer]. Understanding Your Risk for Falls Millions of people have serious injuries from falls each year. It is important to understand your risk of falling. Talk with your health care provider about your risk and what you can do to lower it. If you do have a serious fall, make sure to tell your provider. Falling once raises your risk of falling again. How can falls affect me? Serious injuries from falls are common. These include: Broken bones, such as hip fractures. Head injuries, such as traumatic brain injuries (TBI) or concussions. A fear of falling can cause you to avoid activities and stay at home. This can make your muscles weaker and raise your risk for a fall. What can increase my  risk? There are a number of risk factors that increase your risk for falling. The more risk factors you have, the higher your risk of falling. Serious injuries from a fall happen most often to people who are older than 78 years old. Teenagers and young adults ages 51-29 are also at higher risk. Common risk factors include: Weakness in the lower body. Being generally weak or confused due to long-term (chronic) illness. Dizziness or balance problems. Poor vision. Medicines that cause dizziness or drowsiness. These may include: Medicines for your blood pressure, heart, anxiety, insomnia, or swelling (edema). Pain medicines. Muscle relaxants. Other risk factors include: Drinking alcohol. Having had a fall in the past. Having foot pain or wearing improper footwear. Working at a dangerous job. Having any of the following in your home: Tripping hazards, such as floor clutter or loose rugs. Poor lighting. Pets. Having dementia or memory loss. What actions can I take to lower my risk of falling?     Physical activity Stay physically fit. Do strength and balance exercises. Consider taking a regular class to build strength and balance. Yoga and tai chi are good options. Vision Have your eyes checked every year and your prescription for glasses or contacts updated as needed. Shoes and walking aids Wear non-skid shoes. Wear shoes that have rubber soles and low heels. Do not wear high heels. Do not walk around the house in socks or slippers. Use a cane or walker as told by your provider. Home safety Attach secure railings on both sides of your stairs. Install grab bars for your bathtub, shower, and toilet. Use a non-skid mat in your bathtub or shower. Attach bath mats securely with double-sided, non-slip rug tape. Use good lighting in all rooms. Keep a flashlight near your bed. Make sure there is a clear path from your bed to the bathroom. Use night-lights. Do not use throw rugs. Make sure  all carpeting is taped or tacked down securely. Remove all clutter from walkways and stairways, including extension cords. Repair uneven or broken steps and floors. Avoid walking on icy or slippery surfaces. Walk on the grass instead of on icy or slick sidewalks. Use ice melter to get rid of ice on walkways in the winter. Use a cordless phone. Questions to ask your health care provider Can you help me check my risk for a fall? Do any of my medicines make me more likely to fall? Should I take a vitamin D supplement? What exercises can I do to improve my strength  and balance? Should I make an appointment to have my vision checked? Do I need a bone density test to check for weak bones (osteoporosis)? Would it help to use a cane or a walker? Where to find more information Centers for Disease Control and Prevention, STEADI: TonerPromos.no Community-Based Fall Prevention Programs: TonerPromos.no [employer] on Aging: BaseRingTones.pl Contact a health care provider if: You fall at home. You are afraid of falling at home. You feel weak, drowsy, or dizzy. This information is not intended to replace advice given to you by your health care provider. Make sure you discuss any questions you have with your health care provider. Document Revised: 11/21/2021 Document Reviewed: 11/21/2021 Elsevier Patient Education  2024 [employer].

## 2023-08-20 ENCOUNTER — Telehealth: Payer: Self-pay

## 2023-08-20 NOTE — Progress Notes (Signed)
 Complex Care Management Note  Care Guide Note 08/20/2023 Name: Sheri Russell MRN: [id number] DOB: 1945-05-22  Sheri Russell is a 78 y.o. year old female who sees Delfina Feller, FNP for primary care. I reached out to [name] by phone today to offer complex care management services.  Ms. Rendall was given information about Complex Care Management services today including:   The Complex Care Management services include support from the care team which includes your Nurse Care Manager, Clinical Social Worker, or Pharmacist.  The Complex Care Management team is here to help remove barriers to the health concerns and goals most important to you. Complex Care Management services are voluntary, and the patient may decline or stop services at any time by request to their care team member.   Complex Care Management Consent Status: Patient agreed to services and verbal consent obtained.   Follow up plan:  Telephone appointment with complex care management team member scheduled for:  09/07/2023  Encounter Outcome:  Patient Scheduled  Lenton Rail , RMA     [place]  [facility] Guide  Direct Dial: [phone]  Website: [email or web address]

## 2023-09-03 ENCOUNTER — Ambulatory Visit: Payer: Self-pay

## 2023-09-03 NOTE — Telephone Encounter (Signed)
 Copied from CRM [id number]. Topic: Clinical - Red Word Triage >> Sep 03, 2023 10:52 AM Hamp Levine R wrote: [facility] that prompted transfer to Nurse Triage: Patient states is experiencing Sciatica pain in her left side going down into her leg and feet Left ankle is swollen. Pain when laying down is serve enough that she is unable to sleep.  Chief Complaint: left side pain, left ankle swollen Symptoms: pain Frequency: constant Pertinent Negatives: Patient denies fever, pain with urination Disposition: [] ED /[] Urgent Care (no appt availability in office) / [x] Appointment(In office/virtual)/ []  [facility]/ [] Home Care/ [] Refused Recommended Disposition /[] [facility]/ []  Follow-up with PCP Additional Notes: apt scheduled for tomorrow; care advice given, denies questions; instructed to go to ER if becomes worse.   Reason for Disposition  MODERATE pain (e.g., interferes with normal activities or awakens from sleep)  Answer Assessment - Initial Assessment Questions 1. LOCATION: "Where does it hurt?" (e.g., left, right)     Left side 2. ONSET: "When did the pain start?"     6 weeks ago; hx kidney issues 3. SEVERITY: "How bad is the pain?" (e.g., Scale 1-10; mild, moderate, or severe)   - MILD (1-3): doesn't interfere with normal activities    - MODERATE (4-7): interferes with normal activities or awakens from sleep    - SEVERE (8-10): excruciating pain and patient unable to do normal activities (stays in bed)       severe 4. PATTERN: "Does the pain come and go, or is it constant?"      constant 5. CAUSE: "What do you think is causing the pain?"     Sciatica pain 6. OTHER SYMPTOMS:  "Do you have any other symptoms?" (e.g., fever, abdomen pain, vomiting, leg weakness, burning with urination, blood in urine)     Left ankle is swollen 7. PREGNANCY:  "Is there any chance you are pregnant?" "When was your last menstrual period?"     na  Protocols used: Flank Pain-A-AH

## 2023-09-04 ENCOUNTER — Ambulatory Visit: Admitting: Family Medicine

## 2023-09-04 ENCOUNTER — Encounter: Payer: Self-pay | Admitting: Family Medicine

## 2023-09-04 VITALS — BP 139/75 | HR 88 | Temp 98.1°F | Ht 65.0 in | Wt 135.8 lb

## 2023-09-04 DIAGNOSIS — M5442 Lumbago with sciatica, left side: Secondary | ICD-10-CM

## 2023-09-04 DIAGNOSIS — G8929 Other chronic pain: Secondary | ICD-10-CM | POA: Diagnosis not present

## 2023-09-04 MED ORDER — GABAPENTIN 100 MG PO CAPS
100.0000 mg | ORAL_CAPSULE | Freq: Two times a day (BID) | ORAL | 0 refills | Status: DC
Start: 2023-09-04 — End: 2023-09-06

## 2023-09-04 NOTE — Progress Notes (Signed)
 Subjective:  Patient ID: Sheri Russell, female    DOB: 31-Aug-1945, 78 y.o.   MRN: [id number]  Patient Care Team: Delfina Feller, FNP as PCP - General (Nurse Practitioner) Dr Pat[facility], OD (Optometry) Gaile Jourdain, Larene Pleasant, LCSW as Social Worker (Licensed Clinical Social Worker) Allen Archer, NP as Nurse Practitioner (Oncology) Nicolette Barrio, MD (Obstetrics)   Chief Complaint:  Sciatica (Left side x 6 weeks )   HPI: Sheri Russell is a 78 y.o. female presenting on 09/04/2023 for Sciatica (Left side x 6 weeks )  Sheri Russell is a 78 year old female who presents with persistent nerve pain in her left leg.  She experiences persistent nerve pain down her left leg, identified as sciatica, which began approximately three days after a nephrostomy tube placement. The pain extends from her lower back to her ankle and is described as a shooting nerve pain. It is manageable during the day with movement but worsens when sitting or lying down, affecting her sleep.  She has been taking Tylenol every five hours for pain relief, though she is unsure of the duration. She also tried leftover pain medication from a previous procedure without success. Heat pads, topical rubs, and stretches such as pulling her knees to her chest provide some relief. Regular activities like walking her dog and sitting with an elderly lady help manage her symptoms during the day.  She has a history of cancer and is currently on prednisone  every six weeks. She has a nephrostomy tube and colostomy, both are functioning normally. There is no numbness or tingling between her legs, only in the left leg. She has not undergone imaging studies like X-rays for her leg pain. She has started taking vitamin B12 and magnesium supplements, though their effectiveness on the nerve pain is uncertain.     Relevant past medical, surgical, family, and social history reviewed and updated as indicated.  Allergies and  medications reviewed and updated. Data reviewed: Chart in Epic.   Past Medical History:  Diagnosis Date   Allergic rhinitis    Anxiety    Asthma    Bladder cancer (HCC) 1986   Cancer [facility]) 1971   pelvic - oncologist -Dr. Gerrie Krebs [facility] )   Cervical cancer [facility])    Colon polyps    Hyperlipidemia    Hypertension     Past Surgical History:  Procedure Laterality Date   APPENDECTOMY  1986   CHOLECYSTECTOMY  1985   colostomy bag     Several cancer surgeries  1971, 1985, & 1986   TOTAL ABDOMINAL HYSTERECTOMY  1986    Social History   Socioeconomic History   Marital status: Widowed    Spouse name: Not on file   Number of children: 1   Years of education: 8   Highest education level: 8th grade  Occupational History   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  Tobacco Use   Smoking status: Former    Current packs/day: 0.00    Types: Cigarettes    Quit date: 1978    Years since quitting: 47.4   Smokeless tobacco: Never  Vaping Use   Vaping status: Never Used  Substance and Sexual Activity   Alcohol use: Yes    Alcohol/week: 1.0 standard drink of alcohol    Types: 1 Glasses of wine per week    Comment: occasionally    Drug use: No   Sexual activity: Not Currently  Other Topics Concern  Not on file  Social History Narrative   Lives alone    Daughter in [place]   1 grandchild    Social Drivers of Health   Financial Resource Strain: Low Risk  (08/17/2023)   Overall Financial Resource Strain (CARDIA)    Difficulty of Paying Living Expenses: Not hard at all  Food Insecurity: No Food Insecurity (08/17/2023)   Hunger Vital Sign    Worried About Running Out of Food in the Last Year: Never true    Ran Out of Food in the Last Year: Never true  Transportation Needs: No Transportation Needs (08/17/2023)   PRAPARE - Administrator, Civil Service (Medical): No    Lack of Transportation (Non-Medical): No  Physical Activity:  Insufficiently Active (08/17/2023)   Exercise Vital Sign    Days of Exercise per Week: 4 days    Minutes of Exercise per Session: 30 min  Stress: No Stress Concern Present (08/17/2023)   [employer] of Occupational Health - Occupational Stress Questionnaire    Feeling of Stress : Not at all  Social Connections: Socially Isolated (08/17/2023)   Social Connection and Isolation Panel [NHANES]    Frequency of Communication with Friends and Family: More than three times a week    Frequency of Social Gatherings with Friends and Family: More than three times a week    Attends Religious Services: Never    Database administrator or Organizations: No    Attends Banker Meetings: Never    Marital Status: Widowed  Intimate Partner Violence: Not At Risk (08/17/2023)   Humiliation, Afraid, Rape, and Kick questionnaire    Fear of Current or Ex-Partner: No    Emotionally Abused: No    Physically Abused: No    Sexually Abused: No    Outpatient Encounter Medications as of 09/04/2023  Medication Sig   enalapril  (VASOTEC ) 20 MG tablet Take 1 tablet (20 mg total) by mouth 2 (two) times daily.   Ensure Max Protein (ENSURE MAX PROTEIN) LIQD Take 330 mLs (11 oz total) by mouth daily.   gabapentin (NEURONTIN) 100 MG capsule Take 1 capsule (100 mg total) by mouth 2 (two) times daily.   meclizine  (ANTIVERT ) 25 MG tablet TAKE 1 TABLET BY MOUTH THREE TIMES DAILY AS NEEDED FOR DIZZINESS   ondansetron  (ZOFRAN ) 4 MG tablet TAKE 1 TABLET BY MOUTH EVERY 8 HOURS AS NEEDED FOR NAUSEA AND VOMITING   oxybutynin  (DITROPAN ) 5 MG tablet Take 1 tablet (5 mg total) by mouth at bedtime.   VENTOLIN  HFA 108 (90 Base) MCG/ACT inhaler INHALE 2 PUFFS BY MOUTH EVERY 6 HOURS AS NEEDED FOR WHEEZING AND FOR SHORTNESS OF BREATH   ipratropium (ATROVENT) 0.06 % nasal spray 2 sprays into each nostril Three (3) times a day.   No facility-administered encounter medications on file as of 09/04/2023.    Allergies  Allergen  Reactions   Fish Allergy    Iohexol     IVP Dye    Iodinated Contrast Media Rash   Other Hives and Rash    Seafood perch, red snapper any seafood with iodine    Pertinent ROS per HPI, otherwise unremarkable      Objective:  BP 139/75   Pulse 88   Temp 98.1 F (36.7 C)   Ht 5\' 5"  (1.651 m)   Wt 135 lb 12.8 oz (61.6 kg)   LMP 07/09/1975   SpO2 99%   BMI 22.60 kg/m    Wt Readings from Last 3 Encounters:  09/04/23 135 lb 12.8 oz (61.6 kg)  08/17/23 138 lb (62.6 kg)  07/27/23 136 lb (61.7 kg)    Physical Exam Vitals and nursing note reviewed.  Constitutional:      General: She is not in acute distress.    Appearance: Normal appearance. She is not ill-appearing, toxic-appearing or diaphoretic.  HENT:     Head: Normocephalic and atraumatic.     Mouth/Throat:     Mouth: Mucous membranes are moist.  Eyes:     Conjunctiva/sclera: Conjunctivae normal.     Pupils: Pupils are equal, round, and reactive to light.  Cardiovascular:     Rate and Rhythm: Normal rate and regular rhythm.     Heart sounds: Normal heart sounds.  Pulmonary:     Effort: Pulmonary effort is normal.     Breath sounds: Normal breath sounds.  Abdominal:    Musculoskeletal:     Cervical back: Neck supple.     Thoracic back: Normal.     Lumbar back: Tenderness present. No swelling, edema, deformity, signs of trauma, lacerations, spasms or bony tenderness. Decreased range of motion. Positive left straight leg raise test. Negative right straight leg raise test. No scoliosis.     Right hip: Normal.     Left hip: Normal.     Right lower leg: No edema.     Left lower leg: No edema.  Skin:    General: Skin is warm and dry.     Capillary Refill: Capillary refill takes less than 2 seconds.       Neurological:     General: No focal deficit present.     Mental Status: She is alert and oriented to person, place, and time.  Psychiatric:        Mood and Affect: Mood normal.        Behavior: Behavior  normal.        Thought Content: Thought content normal.        Judgment: Judgment normal.    Physical Exam   MUSCULOSKELETAL: Pain on leg raise.        Results for orders placed or performed in visit on 07/12/23  Urine Culture   Collection Time: 07/12/23  3:16 PM   Specimen: Urine   UR  Result Value Ref Range   Urine Culture, Routine Final report (A)    Organism ID, Bacteria Enterococcus faecalis (A)    ORGANISM ID, BACTERIA Comment    Antimicrobial Susceptibility Comment   Microscopic Examination   Collection Time: 07/12/23  3:16 PM   Urine  Result Value Ref Range   WBC, UA >30 (A) 0 - 5 /hpf   RBC, Urine >30 (A) 0 - 2 /hpf   Epithelial Cells (non renal) 0-10 0 - 10 /hpf   Renal Epithel, UA None seen None seen /hpf   Bacteria, UA Moderate (A) None seen/Few   Yeast, UA None seen None seen  Urinalysis, Complete   Collection Time: 07/12/23  3:16 PM  Result Value Ref Range   Specific Gravity, UA 1.010 1.005 - 1.030   pH, UA 6.0 5.0 - 7.5   Color, UA Yellow Yellow   Appearance Ur Cloudy (A) Clear   Leukocytes,UA 2+ (A) Negative   Protein,UA 2+ (A) Negative/Trace   Glucose, UA Negative Negative   Ketones, UA Negative Negative   RBC, UA 3+ (A) Negative   Bilirubin, UA Negative Negative   Urobilinogen, Ur 0.2 0.2 - 1.0 mg/dL   Nitrite, UA Positive (A) Negative   Microscopic  Examination See below:   CBC with Differential/Platelet   Collection Time: 07/12/23  3:28 PM  Result Value Ref Range   WBC 8.1 3.4 - 10.8 x10E3/uL   RBC 4.04 3.77 - 5.28 x10E6/uL   Hemoglobin 10.2 (L) 11.1 - 15.9 g/dL   Hematocrit 16.1 (L) 09.6 - 46.6 %   MCV 82 79 - 97 fL   MCH 25.2 (L) 26.6 - 33.0 pg   MCHC 30.9 (L) 31.5 - 35.7 g/dL   RDW 04.5 40.9 - 81.1 %   Platelets 233 150 - 450 x10E3/uL   Neutrophils 62 Not Estab. %   Lymphs 28 Not Estab. %   Monocytes 6 Not Estab. %   Eos 3 Not Estab. %   Basos 1 Not Estab. %   Neutrophils Absolute 5.0 1.4 - 7.0 x10E3/uL   Lymphocytes Absolute 2.2  0.7 - 3.1 x10E3/uL   Monocytes Absolute 0.5 0.1 - 0.9 x10E3/uL   EOS (ABSOLUTE) 0.3 0.0 - 0.4 x10E3/uL   Basophils Absolute 0.1 0.0 - 0.2 x10E3/uL   Immature Granulocytes 0 Not Estab. %   Immature Grans (Abs) 0.0 0.0 - 0.1 x10E3/uL  CMP14+EGFR   Collection Time: 07/12/23  3:28 PM  Result Value Ref Range   Glucose 104 (H) 70 - 99 mg/dL   BUN 15 8 - 27 mg/dL   Creatinine, Ser 9.14 (H) 0.57 - 1.00 mg/dL   eGFR 40 (L) >78 GN/FAO/1.30   BUN/Creatinine Ratio 11 (L) 12 - 28   Sodium 136 134 - 144 mmol/L   Potassium 4.3 3.5 - 5.2 mmol/L   Chloride 100 96 - 106 mmol/L   CO2 21 20 - 29 mmol/L   Calcium 9.2 8.7 - 10.3 mg/dL   Total Protein 6.5 6.0 - 8.5 g/dL   Albumin 3.9 3.8 - 4.8 g/dL   Globulin, Total 2.6 1.5 - 4.5 g/dL   Bilirubin Total 0.2 0.0 - 1.2 mg/dL   Alkaline Phosphatase 122 (H) 44 - 121 IU/L   AST 17 0 - 40 IU/L   ALT 15 0 - 32 IU/L  Lipid panel   Collection Time: 07/12/23  3:28 PM  Result Value Ref Range   Cholesterol, Total 168 100 - 199 mg/dL   Triglycerides 865 0 - 149 mg/dL   HDL 57 >78 mg/dL   VLDL Cholesterol Cal 21 5 - 40 mg/dL   LDL Chol Calc (NIH) 90 0 - 99 mg/dL   Chol/HDL Ratio 2.9 0.0 - 4.4 ratio       Pertinent labs & imaging results that were available during my care of the patient were reviewed by me and considered in my medical decision making.  Assessment & Plan:  Orel was seen today for sciatica.  Diagnoses and all orders for this visit:  Chronic bilateral low back pain with left-sided sciatica -     gabapentin (NEURONTIN) 100 MG capsule; Take 1 capsule (100 mg total) by mouth 2 (two) times daily.     Sciatica Chronic nerve pain radiating down the left leg, exacerbated by sitting or lying down. Pain is managed during the day with activity but worsens at night. No bowel or bladder dysfunction reported. Previous treatments include acetaminophen and heat application. No imaging studies have been performed. Gabapentin is considered for nerve pain  management, with potential side effects of drowsiness discussed. She prefers to avoid strong medications due to concerns about future dependency. - Prescribe gabapentin 100 mg capsules, to be taken twice daily, starting with nighttime dosing. - Educate on  the use of heat and stretching exercises for symptom relief. - Limit acetaminophen intake to no more than 3000 mg per day. - Provide educational materials on sciatica and gabapentin. - Schedule follow-up in 3-4 weeks to assess medication efficacy.  Cancer Cancer managed with prednisone  every six weeks. She is aware of a limited prognosis but is optimistic about exceeding initial expectations.  General Health Maintenance Taking vitamin D and magnesium supplements, potentially for nerve pain relief. No recent vitamin B12 levels checked. - Continue vitamin D and magnesium supplementation.       Follow up plan: Return in 4 weeks (on 10/02/2023), or if symptoms worsen or fail to improve, for PCP for sciatica .   Continue healthy lifestyle choices, including diet (rich in fruits, vegetables, and lean proteins, and low in salt and simple carbohydrates) and exercise (at least 30 minutes of moderate physical activity daily).  Educational handout given for sciatica, gabapentin   The above assessment and management plan was discussed with the patient. The patient verbalized understanding of and has agreed to the management plan. Patient is aware to call the clinic if they develop any new symptoms or if symptoms persist or worsen. Patient is aware when to return to the clinic for a follow-up visit. Patient educated on when it is appropriate to go to the emergency department.   Kattie Parrot, FNP-C [facility] [phone]

## 2023-09-06 ENCOUNTER — Telehealth: Payer: Self-pay

## 2023-09-06 ENCOUNTER — Ambulatory Visit: Admitting: Family Medicine

## 2023-09-06 ENCOUNTER — Encounter: Payer: Self-pay | Admitting: Family Medicine

## 2023-09-06 VITALS — BP 104/63 | HR 107 | Temp 98.7°F | Ht 65.0 in | Wt 135.0 lb

## 2023-09-06 DIAGNOSIS — M5442 Lumbago with sciatica, left side: Secondary | ICD-10-CM | POA: Diagnosis not present

## 2023-09-06 DIAGNOSIS — R591 Generalized enlarged lymph nodes: Secondary | ICD-10-CM

## 2023-09-06 DIAGNOSIS — G8929 Other chronic pain: Secondary | ICD-10-CM | POA: Diagnosis not present

## 2023-09-06 DIAGNOSIS — R944 Abnormal results of kidney function studies: Secondary | ICD-10-CM | POA: Diagnosis not present

## 2023-09-06 DIAGNOSIS — M7989 Other specified soft tissue disorders: Secondary | ICD-10-CM | POA: Diagnosis not present

## 2023-09-06 MED ORDER — GABAPENTIN 100 MG PO CAPS: 100.0000 mg | ORAL_CAPSULE | Freq: Two times a day (BID) | ORAL | Status: DC

## 2023-09-06 NOTE — Patient Instructions (Signed)
 Recommend elevation, compression stockings

## 2023-09-06 NOTE — Progress Notes (Signed)
 Subjective:  Patient ID: Sheri Russell, female    DOB: 1945/05/23, 78 y.o.   MRN: [id number]  Patient Care Team: Delfina Feller, FNP as PCP - General (Nurse Practitioner) Dr Patria [facility], OD (Optometry) Gaile Jourdain, Larene Pleasant, LCSW as Social Worker (Licensed Clinical Social Worker) Allen Archer, NP as Nurse Practitioner (Oncology) Nicolette Barrio, MD (Obstetrics)   Chief Complaint:  Leg Swelling (Pink/Warm to the touch/Slow onset)  HPI: Sheri Russell is a 78 y.o. female presenting on 09/06/2023 for Leg Swelling (Pink/Warm to the touch/Slow onset)  HPI Patient presents today with pain and swelling in her left leg. She states that it started a few days ago. She initially thought that it was sciatic pain and was started on gabapentin 2 days ago for the pain. States that gabapentin did not help her symptoms. She has a history of nephrostomy tube exchanged and is worried about infection. Denies fever, shortness of breath, chest pain, rash, nausea, vomiting. 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   Relevant past medical, surgical, family, and social history reviewed and updated as indicated.  Allergies and medications reviewed and updated. Data reviewed: Chart in Epic.   Past Medical History:  Diagnosis Date   Allergic rhinitis    Anxiety    Asthma    Bladder cancer (HCC) 1986   Cancer [facility]) 1971   pelvic - oncologist -Dr. 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  Tobacco Use   Smoking status: Former    Current packs/day: 0.00    Types: Cigarettes    Quit date: 1978    Years since quitting: 47.4   Smokeless tobacco: Never  Vaping Use   Vaping status: Never Used  Substance and Sexual Activity   Alcohol use: Yes    Alcohol/week: 1.0 standard drink of alcohol    Types: 1 Glasses of wine per week    Comment: occasionally    Drug use: No   Sexual activity: Not Currently  Other Topics Concern   Not on file  Social History Narrative   Lives alone    Daughter in [place]   1 grandchild    Social Drivers of Health   Financial Resource Strain: Low Risk  (08/17/2023)   Overall Financial Resource Strain (CARDIA)    Difficulty of Paying Living Expenses: Not hard at all  Food Insecurity: No Food Insecurity (08/17/2023)   Hunger Vital Sign    Worried About Running Out of Food in the Last Year: Never true    Ran Out of Food in the Last Year: Never true  Transportation Needs: No Transportation Needs (08/17/2023)   PRAPARE - Administrator, Civil Service (Medical): No    Lack of Transportation (Non-Medical): No  Physical Activity: Insufficiently Active (08/17/2023)  Exercise Vital Sign    Days of Exercise per Week: 4 days    Minutes of Exercise per Session: 30 min  Stress: No Stress Concern Present (08/17/2023)   [employer] of Occupational Health - Occupational Stress Questionnaire    Feeling of Stress : Not at all  Social Connections: Socially Isolated (08/17/2023)   Social Connection and Isolation Panel [NHANES]    Frequency of Communication with Friends and Family: More than three times a week    Frequency of Social Gatherings with Friends and Family: More than three times a week    Attends Religious Services: Never    Database administrator or Organizations: No    Attends Banker Meetings: Never    Marital Status: Widowed   Intimate Partner Violence: Not At Risk (08/17/2023)   Humiliation, Afraid, Rape, and Kick questionnaire    Fear of Current or Ex-Partner: No    Emotionally Abused: No    Physically Abused: No    Sexually Abused: No    Outpatient Encounter Medications as of 09/06/2023  Medication Sig   enalapril  (VASOTEC ) 20 MG tablet Take 1 tablet (20 mg total) by mouth 2 (two) times daily.   Ensure Max Protein (ENSURE MAX PROTEIN) LIQD Take 330 mLs (11 oz total) by mouth daily.   gabapentin (NEURONTIN) 100 MG capsule Take 1 capsule (100 mg total) by mouth 2 (two) times daily.   meclizine  (ANTIVERT ) 25 MG tablet TAKE 1 TABLET BY MOUTH THREE TIMES DAILY AS NEEDED FOR DIZZINESS   ondansetron  (ZOFRAN ) 4 MG tablet TAKE 1 TABLET BY MOUTH EVERY 8 HOURS AS NEEDED FOR NAUSEA AND VOMITING   oxybutynin  (DITROPAN ) 5 MG tablet Take 1 tablet (5 mg total) by mouth at bedtime.   VENTOLIN  HFA 108 (90 Base) MCG/ACT inhaler INHALE 2 PUFFS BY MOUTH EVERY 6 HOURS AS NEEDED FOR WHEEZING AND FOR SHORTNESS OF BREATH   ipratropium (ATROVENT) 0.06 % nasal spray 2 sprays into each nostril Three (3) times a day.   No facility-administered encounter medications on file as of 09/06/2023.    Allergies  Allergen Reactions   Fish Allergy    Iohexol     IVP Dye    Iodinated Contrast Media Rash   Other Hives and Rash    Seafood perch, red snapper any seafood with iodine    Review of Systems As per HPI  Objective:  BP 104/63   Pulse (!) 107   Temp 98.7 F (37.1 C)   Ht 5\' 5"  (1.651 m)   Wt 135 lb (61.2 kg)   LMP 07/09/1975   SpO2 98%   BMI 22.47 kg/m    Wt Readings from Last 3 Encounters:  09/06/23 135 lb (61.2 kg)  09/04/23 135 lb 12.8 oz (61.6 kg)  08/17/23 138 lb (62.6 kg)   Physical Exam Constitutional:      General: She is awake. She is not in acute distress.    Appearance: Normal appearance. She is well-groomed. She is not ill-appearing, toxic-appearing or diaphoretic.  Cardiovascular:     Rate and Rhythm:  Regular rhythm. Tachycardia present.     Heart sounds: Normal heart sounds.  Pulmonary:     Effort: Pulmonary effort is normal.     Breath sounds: Normal breath sounds.  Abdominal:     Comments: Nephrostomy tube in place at right lower back, dressing CDI  Musculoskeletal:     Left lower leg: 1+ Edema present.  Skin:    General: Skin is warm.  Capillary Refill: Capillary refill takes less than 2 seconds.  Neurological:     General: No focal deficit present.     Mental Status: She is alert, oriented to person, place, and time and easily aroused. Mental status is at baseline.  Psychiatric:        Attention and Perception: Attention and perception normal.        Mood and Affect: Mood and affect normal.        Speech: Speech normal.        Behavior: Behavior normal. Behavior is cooperative.        Thought Content: Thought content normal.        Cognition and Memory: Cognition and memory normal.        Judgment: Judgment normal.     Results for orders placed or performed in visit on 07/12/23  Urine Culture   Collection Time: 07/12/23  3:16 PM   Specimen: Urine   UR  Result Value Ref Range   Urine Culture, Routine Final report (A)    Organism ID, Bacteria Enterococcus faecalis (A)    ORGANISM ID, BACTERIA Comment    Antimicrobial Susceptibility Comment   Microscopic Examination   Collection Time: 07/12/23  3:16 PM   Urine  Result Value Ref Range   WBC, UA >30 (A) 0 - 5 /hpf   RBC, Urine >30 (A) 0 - 2 /hpf   Epithelial Cells (non renal) 0-10 0 - 10 /hpf   Renal Epithel, UA None seen None seen /hpf   Bacteria, UA Moderate (A) None seen/Few   Yeast, UA None seen None seen  Urinalysis, Complete   Collection Time: 07/12/23  3:16 PM  Result Value Ref Range   Specific Gravity, UA 1.010 1.005 - 1.030   pH, UA 6.0 5.0 - 7.5   Color, UA Yellow Yellow   Appearance Ur Cloudy (A) Clear   Leukocytes,UA 2+ (A) Negative   Protein,UA 2+ (A) Negative/Trace   Glucose, UA Negative  Negative   Ketones, UA Negative Negative   RBC, UA 3+ (A) Negative   Bilirubin, UA Negative Negative   Urobilinogen, Ur 0.2 0.2 - 1.0 mg/dL   Nitrite, UA Positive (A) Negative   Microscopic Examination See below:   CBC with Differential/Platelet   Collection Time: 07/12/23  3:28 PM  Result Value Ref Range   WBC 8.1 3.4 - 10.8 x10E3/uL   RBC 4.04 3.77 - 5.28 x10E6/uL   Hemoglobin 10.2 (L) 11.1 - 15.9 g/dL   Hematocrit 52.8 (L) 41.3 - 46.6 %   MCV 82 79 - 97 fL   MCH 25.2 (L) 26.6 - 33.0 pg   MCHC 30.9 (L) 31.5 - 35.7 g/dL   RDW 24.4 01.0 - 27.2 %   Platelets 233 150 - 450 x10E3/uL   Neutrophils 62 Not Estab. %   Lymphs 28 Not Estab. %   Monocytes 6 Not Estab. %   Eos 3 Not Estab. %   Basos 1 Not Estab. %   Neutrophils Absolute 5.0 1.4 - 7.0 x10E3/uL   Lymphocytes Absolute 2.2 0.7 - 3.1 x10E3/uL   Monocytes Absolute 0.5 0.1 - 0.9 x10E3/uL   EOS (ABSOLUTE) 0.3 0.0 - 0.4 x10E3/uL   Basophils Absolute 0.1 0.0 - 0.2 x10E3/uL   Immature Granulocytes 0 Not Estab. %   Immature Grans (Abs) 0.0 0.0 - 0.1 x10E3/uL  CMP14+EGFR   Collection Time: 07/12/23  3:28 PM  Result Value Ref Range   Glucose 104 (H) 70 - 99 mg/dL  BUN 15 8 - 27 mg/dL   Creatinine, Ser 0.86 (H) 0.57 - 1.00 mg/dL   eGFR 40 (L) >57 QI/ONG/2.95   BUN/Creatinine Ratio 11 (L) 12 - 28   Sodium 136 134 - 144 mmol/L   Potassium 4.3 3.5 - 5.2 mmol/L   Chloride 100 96 - 106 mmol/L   CO2 21 20 - 29 mmol/L   Calcium 9.2 8.7 - 10.3 mg/dL   Total Protein 6.5 6.0 - 8.5 g/dL   Albumin 3.9 3.8 - 4.8 g/dL   Globulin, Total 2.6 1.5 - 4.5 g/dL   Bilirubin Total 0.2 0.0 - 1.2 mg/dL   Alkaline Phosphatase 122 (H) 44 - 121 IU/L   AST 17 0 - 40 IU/L   ALT 15 0 - 32 IU/L  Lipid panel   Collection Time: 07/12/23  3:28 PM  Result Value Ref Range   Cholesterol, Total 168 100 - 199 mg/dL   Triglycerides 284 0 - 149 mg/dL   HDL 57 >13 mg/dL   VLDL Cholesterol Cal 21 5 - 40 mg/dL   LDL Chol Calc (NIH) 90 0 - 99 mg/dL   Chol/HDL  Ratio 2.9 0.0 - 4.4 ratio       09/04/2023    3:45 PM 08/17/2023    2:23 PM 07/27/2023    3:24 PM 05/11/2023    8:54 AM 01/30/2023    2:09 PM  Depression screen PHQ 2/9  Decreased Interest 0 0 0 0 0  Down, Depressed, Hopeless 0 0 0 0 0  PHQ - 2 Score 0 0 0 0 0  Altered sleeping 3 2  1  0  Tired, decreased energy 2 3  0 0  Change in appetite 0 0  0 0  Feeling bad or failure about yourself  0 0  0 0  Trouble concentrating 0 0  0 0  Moving slowly or fidgety/restless 0   0 0  Suicidal thoughts 0 0  0 0  PHQ-9 Score 5 5  1  0  Difficult doing work/chores Somewhat difficult Not difficult at all  Not difficult at all Not difficult at all       07/27/2023    3:24 PM 05/11/2023    8:55 AM 01/30/2023    2:09 PM 08/01/2022    2:11 PM  GAD 7 : Generalized Anxiety Score  Nervous, Anxious, on Edge 0 0 0 0  Control/stop worrying 0 0 0 0  Worry too much - different things 0 0 0 0  Trouble relaxing 0 0 0 0  Restless 0 0 0 0  Easily annoyed or irritable 0 0 0 0  Afraid - awful might happen 0 0 0 0  Total GAD 7 Score 0 0 0 0  Anxiety Difficulty Not difficult at all Not difficult at all Not difficult at all Not difficult at all    Pertinent labs & imaging results that were available during my care of the patient were reviewed by me and considered in my medical decision making.  Assessment & Plan:  Milly was seen today for leg swelling.  Diagnoses and all orders for this visit:  1. Lymphadenopathy (Primary) Discussed with patient that I do not believe abx is necessary at this time as she does not have rash, legs are equal temperature. Reassured by exam as she has good pulses, no paraesthesia or pallor of lower extremity. Provided patient information and education. Encouraged pt to elevate leg, wear compression stockings and walk as she can. Patient states that she  has hydrochlorothiazide  at home. Discussed with patient that she can start with one half tablet. Will have to balance how much she can  take given renal function.   2. Left leg swelling As above.   3. Chronic bilateral low back pain with left-sided sciatica For pain, patient can increase medication as below and is still under recommended daily dose for her GFR.  - gabapentin (NEURONTIN) 100 MG capsule; Take 1-2 capsules (100-200 mg total) by mouth 2 (two) times daily.  4. Decreased GFR As above.   Continue all other maintenance medications.  Follow up plan: Return if symptoms worsen or fail to improve.   Continue healthy lifestyle choices, including diet (rich in fruits, vegetables, and lean proteins, and low in salt and simple carbohydrates) and exercise (at least 30 minutes of moderate physical activity daily).  Written and verbal instructions provided   The above assessment and management plan was discussed with the patient. The patient verbalized understanding of and has agreed to the management plan. Patient is aware to call the clinic if they develop any new symptoms or if symptoms persist or worsen. Patient is aware when to return to the clinic for a follow-up visit. Patient educated on when it is appropriate to go to the emergency department.   Jacqualyn Mates, DNP-FNP [facility] [address] [place], [place] [address] [phone]

## 2023-09-06 NOTE — Progress Notes (Signed)
 Complex Care Management Care Guide Note  09/06/2023 Name: Sheri Russell MRN: [id number] DOB: 04/18/1945  Sheri Russell is a 78 y.o. year old female who is a primary care patient of Delfina Feller, FNP and is actively engaged with the care management team. I reached out to [name] by phone today to assist with re-scheduling  with the RN Case Manager.  Follow up plan: Unsuccessful telephone outreach attempt made. A HIPAA compliant phone message was left for the patient providing contact information and requesting a return call.  Lenton Rail , RMA     [facility]  [facility], [facility] Guide  Direct Dial: [phone]  Website: [email or web address]

## 2023-09-07 ENCOUNTER — Telehealth: Payer: Self-pay | Admitting: *Deleted

## 2023-09-11 ENCOUNTER — Ambulatory Visit: Admitting: Nurse Practitioner

## 2023-09-11 ENCOUNTER — Encounter: Payer: Self-pay | Admitting: Nurse Practitioner

## 2023-09-11 VITALS — BP 131/73 | HR 97 | Temp 98.2°F | Ht 65.0 in | Wt 134.0 lb

## 2023-09-11 DIAGNOSIS — C799 Secondary malignant neoplasm of unspecified site: Secondary | ICD-10-CM | POA: Diagnosis not present

## 2023-09-11 DIAGNOSIS — M5431 Sciatica, right side: Secondary | ICD-10-CM

## 2023-09-11 DIAGNOSIS — M5432 Sciatica, left side: Secondary | ICD-10-CM

## 2023-09-11 MED ORDER — METHYLPREDNISOLONE ACETATE 80 MG/ML IJ SUSP
80.0000 mg | Freq: Once | INTRAMUSCULAR | Status: AC
Start: 2023-09-11 — End: 2023-09-11
  Administered 2023-09-11: 80 mg via INTRAMUSCULAR

## 2023-09-11 MED ORDER — TRAMADOL HCL 50 MG PO TABS: 50.0000 mg | ORAL_TABLET | Freq: Two times a day (BID) | ORAL | 2 refills | Status: DC

## 2023-09-11 MED ORDER — KETOROLAC TROMETHAMINE 60 MG/2ML IM SOLN
60.0000 mg | Freq: Once | INTRAMUSCULAR | Status: AC
  Administered 2023-09-11: 60 mg via INTRAMUSCULAR

## 2023-09-11 NOTE — Progress Notes (Signed)
   Subjective:    Patient ID: Sheri Russell, female    DOB: 18-Mar-1946, 78 y.o.   MRN: [id number]   Chief Complaint: bilateral leg pain (Gabapentin  not helping/)   HPI  Patient in today c/o continuing leg pain bil. She is on neurontin  and does not help. The pain is now down then back of both legs and radiates down to her knees. Rates pain 9/10 this morning. Sitting increases pain. Standing and walking helps some.   She was recently dx with cancer metastasis- and has refused treatment.   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  Eyes:  Negative for pain.  Respiratory:  Negative for shortness of breath.   Cardiovascular:  Negative for chest pain, palpitations and leg swelling.  Gastrointestinal:  Negative for abdominal pain.  Endocrine: Negative for polydipsia.  Skin:  Negative for rash.  Neurological:  Negative for dizziness, weakness and headaches.  Hematological:  Does not bruise/bleed easily.  All other systems reviewed and are negative.      Objective:   Physical Exam Constitutional:      Appearance: Normal appearance.  Cardiovascular:     Rate and Rhythm: Normal rate and regular rhythm.     Heart sounds: Normal heart sounds.  Pulmonary:     Breath sounds: Normal breath sounds.  Musculoskeletal:     Right lower leg: No edema.     Left lower leg: Edema (ankle edema) present.     Comments: Pacing in exam room. (-) SLR bil Motor strength and sensation  distally intact  Neurological:     General: No focal deficit present.     Mental Status: She is alert and oriented to person, place, and time.  Psychiatric:        Mood and Affect: Mood normal.        Behavior: Behavior normal.    BP 131/73   Pulse 97   Temp 98.2 F (36.8 C) (Temporal)   Ht 5\' 5"  (1.651 m)   Wt 134 lb (60.8 kg)   LMP 07/09/1975   SpO2 99%   BMI 22.30 kg/m         Assessment & Plan:   Sheri Russell in today with chief complaint of bilateral leg pain (Gabapentin  not helping/)   1. Bilateral sciatica (Primary) Moist heat Rest RTO prn - traMADol (ULTRAM) 50 MG tablet; Take 1 tablet (50 mg total) by mouth 2 (two) times daily.  Dispense: 60 tablet; Refill: 2 - methylPREDNISolone acetate (DEPO-MEDROL) injection 80 mg - ketorolac (TORADOL) injection 60 mg    The above assessment and management plan was discussed with the patient. The patient verbalized understanding of and has agreed to the management plan. Patient is aware to call the clinic if symptoms persist or worsen. Patient is aware when to return to the clinic for a follow-up visit. Patient educated on when it is appropriate to go to the emergency department.   Mary-Margaret Gaylyn Keas, FNP

## 2023-09-11 NOTE — Patient Instructions (Signed)
 Sciatica  Sciatica is pain, weakness, tingling, or loss of feeling (numbness) along the sciatic nerve. The sciatic nerve starts in the lower back and goes down the back of each leg. Sciatica usually affects one side of the body. Sciatica usually goes away on its own or with treatment. Sometimes, sciatica may come back. What are the causes? This condition happens when the sciatic nerve is pinched or has pressure put on it. This may be caused by: A disk in between the bones of the spine bulging out too far (herniated disk). Changes in the spinal disks due to aging. A condition that affects a muscle in the butt. Extra bone growth near the sciatic nerve. A break (fracture) of the area between your hip bones (pelvis). Pregnancy. Tumor. This is rare. What increases the risk? You are more likely to develop this condition if you: Play sports that put pressure or stress on the spine. Have poor strength and ease of movement (flexibility). Have had a back injury or back surgery. Sit for long periods of time. Do activities that involve bending or lifting over and over again. Are very overweight (obese). What are the signs or symptoms? Symptoms can vary from mild to very bad. They may include: Any of these problems in the lower back, leg, hip, or butt: Mild tingling, loss of feeling, or dull aches. A burning feeling. Sharp pains. Loss of feeling in the back of the calf or the sole of the foot. Leg weakness. Very bad back pain that makes it hard to move. These symptoms may get worse when you cough, sneeze, or laugh. They may also get worse when you sit or stand for long periods of time. How is this treated? This condition often gets better without any treatment. However, treatment may include: Changing or cutting back on physical activity when you have pain. Exercising, including strengthening and stretching. Putting ice or heat on the affected area. Shots of medicines to relieve pain and  swelling or to relax your muscles. Surgery. Follow these instructions at home: Medicines Take over-the-counter and prescription medicines only as told by your doctor. Ask your doctor if you should avoid driving or using machines while you are taking your medicine. Managing pain     If told, put ice on the affected area. To do this: Put ice in a plastic bag. Place a towel between your skin and the bag. Leave the ice on for 20 minutes, 2-3 times a day. If your skin turns bright red, take off the ice right away to prevent skin damage. The risk of skin damage is higher if you cannot feel pain, heat, or cold. If told, put heat on the affected area. Do this as often as told by your doctor. Use the heat source that your doctor tells you to use, such as a moist heat pack or a heating pad. Place a towel between your skin and the heat source. Leave the heat on for 20-30 minutes. If your skin turns bright red, take off the heat right away to prevent burns. The risk of burns is higher if you cannot feel pain, heat, or cold. Activity  Return to your normal activities when your doctor says that it is safe. Avoid activities that make your symptoms worse. Take short rests during the day. When you rest for a long time, do some physical activity or stretching between periods of rest. Avoid sitting for a long time without moving. Get up and move around at least one time each  hour. Do exercises and stretches as told by your doctor. Do not lift anything that is heavier than 10 lb (4.5 kg). Avoid lifting heavy things even when you do not have symptoms. Avoid lifting heavy things over and over. When you lift objects, always lift in a way that is safe for your body. To do this, you should: Bend your knees. Keep the object close to your body. Avoid twisting. General instructions Stay at a healthy weight. Wear comfortable shoes that support your feet. Avoid wearing high heels. Avoid sleeping on a mattress  that is too soft or too hard. You might have less pain if you sleep on a mattress that is firm enough to support your back. Contact a doctor if: Your pain is not controlled by medicine. Your pain does not get better. Your pain gets worse. Your pain lasts longer than 4 weeks. You lose weight without trying. Get help right away if: You cannot control when you pee (urinate) or poop (have a bowel movement). You have weakness in any of these areas and it gets worse: Lower back. The area between your hip bones. Butt. Legs. You have redness or swelling of your back. You have a burning feeling when you pee. Summary Sciatica is pain, weakness, tingling, or loss of feeling (numbness) along the sciatic nerve. This may include the lower back, legs, hips, and butt. This condition happens when the sciatic nerve is pinched or has pressure put on it. Treatment often includes rest, exercise, medicines, and putting ice or heat on the affected area. This information is not intended to replace advice given to you by your health care provider. Make sure you discuss any questions you have with your health care provider. Document Revised: 06/27/2021 Document Reviewed: 06/27/2021 Elsevier Patient Education  2024 [employer].

## 2023-09-13 NOTE — Progress Notes (Signed)
 Complex Care Management Care Guide Note  09/13/2023 Name: Sheri Russell MRN: [id number] DOB: [birth year]  Sheri Russell is a 78 y.o. year old female who is a primary care patient of Delfina Feller, FNP and is actively engaged with the care management team. I reached out to [name] by phone today to assist with re-scheduling  with the RN Case Manager.  Follow up plan: Telephone appointment with complex care management team member scheduled for:  10/09/2023  Lenton Rail , RMA     [place]  [facility], [facility] Guide  Direct Dial: [phone]  Website: [email or web address]

## 2023-09-25 DIAGNOSIS — N134 Hydroureter: Secondary | ICD-10-CM | POA: Diagnosis not present

## 2023-09-25 DIAGNOSIS — N1330 Unspecified hydronephrosis: Secondary | ICD-10-CM | POA: Diagnosis not present

## 2023-09-25 DIAGNOSIS — Z436 Encounter for attention to other artificial openings of urinary tract: Secondary | ICD-10-CM | POA: Diagnosis not present

## 2023-09-25 DIAGNOSIS — N131 Hydronephrosis with ureteral stricture, not elsewhere classified: Secondary | ICD-10-CM | POA: Diagnosis not present

## 2023-09-25 DIAGNOSIS — N820 Vesicovaginal fistula: Secondary | ICD-10-CM | POA: Diagnosis not present

## 2023-09-25 DIAGNOSIS — C679 Malignant neoplasm of bladder, unspecified: Secondary | ICD-10-CM | POA: Diagnosis not present

## 2023-09-25 DIAGNOSIS — N321 Vesicointestinal fistula: Secondary | ICD-10-CM | POA: Diagnosis not present

## 2023-09-25 DIAGNOSIS — Z466 Encounter for fitting and adjustment of urinary device: Secondary | ICD-10-CM | POA: Diagnosis not present

## 2023-09-27 ENCOUNTER — Ambulatory Visit: Admitting: Nurse Practitioner

## 2023-09-27 ENCOUNTER — Encounter: Payer: Self-pay | Admitting: Nurse Practitioner

## 2023-09-27 VITALS — BP 135/73 | HR 100 | Temp 98.3°F | Ht 65.0 in | Wt 134.0 lb

## 2023-09-27 DIAGNOSIS — M5432 Sciatica, left side: Secondary | ICD-10-CM | POA: Diagnosis not present

## 2023-09-27 DIAGNOSIS — M5431 Sciatica, right side: Secondary | ICD-10-CM | POA: Diagnosis not present

## 2023-09-27 MED ORDER — METHOCARBAMOL 500 MG PO TABS: 500.0000 mg | ORAL_TABLET | Freq: Four times a day (QID) | ORAL | 0 refills | Status: DC

## 2023-09-27 MED ORDER — HYDROCODONE-ACETAMINOPHEN 5-325 MG PO TABS: 1.0000 | ORAL_TABLET | Freq: Four times a day (QID) | ORAL | 0 refills | Status: DC | PRN

## 2023-09-27 NOTE — Patient Instructions (Signed)
 Neuropathic Pain Neuropathic pain is pain caused by damage to the nerves that are responsible for certain sensations in your body (sensory nerves). Neuropathic pain can make you more sensitive to pain. Even a minor sensation can feel very painful. This is usually a long-term (chronic) condition that can be difficult to treat. The type of pain differs from person to person. It may: Start suddenly (acute), or it may develop slowly and become chronic. Come and go as damaged nerves heal, or it may stay at the same level for years. Cause emotional distress, loss of sleep, and a lower quality of life. What are the causes? The most common cause of this condition is diabetes. Many other diseases and conditions can also cause neuropathic pain. Causes of neuropathic pain can be classified as: Toxic. This is caused by medicines and chemicals. The most common causes of toxic neuropathic pain is damage from medicines that kill cancer cells (chemotherapy) or alcohol abuse. Metabolic. This can be caused by: Diabetes. Lack of vitamins like B12. Traumatic. Any injury that cuts, crushes, or stretches a nerve can cause damage and pain. Compression-related. If a sensory nerve gets trapped or compressed for a long period of time, the blood supply to the nerve can be cut off. Vascular. Many blood vessel diseases can cause neuropathic pain by decreasing blood supply and oxygen to nerves. Autoimmune. This type of pain results from diseases in which the body's defense system (immune system) mistakenly attacks sensory nerves. Examples of autoimmune diseases that can cause neuropathic pain include lupus and multiple sclerosis. Infectious. Many types of viral infections can damage sensory nerves and cause pain. Shingles infection is a common cause of this type of pain. Inherited. Neuropathic pain can be a symptom of many diseases that are passed down through families (genetic). What increases the risk? You are more likely to  develop this condition if: You have diabetes. You smoke. You drink too much alcohol. You are taking certain medicines, including chemotherapy or medicines that treat immune system disorders. What are the signs or symptoms? The main symptom is pain. Neuropathic pain is often described as: Burning. Shock-like. Stinging. Hot or cold. Itching. How is this diagnosed? No single test can diagnose neuropathic pain. It is diagnosed based on: A physical exam and your symptoms. Your health care provider will ask you about your pain. You may be asked to use a pain scale to describe how bad your pain is. Tests. These may be done to see if you have a cause and location of any nerve damage. They include: Nerve conduction studies and electromyography to test how well nerve signals travel through your nerves and muscles (electrodiagnostic testing). Skin biopsy to evaluate for small fiber neuropathy. Imaging studies, such as: X-rays. CT scan. MRI. How is this treated? Treatment for neuropathic pain may change over time. You may need to try different treatment options or a combination of treatments. Some options include: Treating the underlying cause of the neuropathy, such as diabetes, kidney disease, or vitamin deficiencies. Stopping medicines that can cause neuropathy, such as chemotherapy. Medicine to relieve pain. Medicines may include: Prescription or over-the-counter pain medicine. Anti-seizure medicine. Antidepressant medicines. Pain-relieving patches or creams that are applied to painful areas of skin. A medicine to numb the area (local anesthetic), which can be injected as a nerve block. Transcutaneous nerve stimulation. This uses electrical currents to block painful nerve signals. The treatment is painless. Alternative treatments, such as: Acupuncture. Meditation. Massage. Occupational or physical therapy. Pain management programs. Counseling. Follow  these instructions at  home: Medicines  Take over-the-counter and prescription medicines only as told by your health care provider. Ask your health care provider if the medicine prescribed to you: Requires you to avoid driving or using machinery. Can cause constipation. You may need to take these actions to prevent or treat constipation: Drink enough fluid to keep your urine pale yellow. Take over-the-counter or prescription medicines. Eat foods that are high in fiber, such as beans, whole grains, and fresh fruits and vegetables. Limit foods that are high in fat and processed sugars, such as fried or sweet foods. Lifestyle  Have a good support system at home. Consider joining a chronic pain support group. Do not use any products that contain nicotine or tobacco. These products include cigarettes, chewing tobacco, and vaping devices, such as e-cigarettes. If you need help quitting, ask your health care provider. Do not drink alcohol. General instructions Learn as much as you can about your condition. Work closely with all your health care providers to find the treatment plan that works best for you. Ask your health care provider what activities are safe for you. Keep all follow-up visits. This is important. Contact a health care provider if: Your pain treatments are not working. You are having side effects from your medicines. You are struggling with tiredness (fatigue), mood changes, depression, or anxiety. Get help right away if: You have thoughts of hurting yourself. Get help right away if you feel like you may hurt yourself or others, or have thoughts about taking your own life. Go to your nearest emergency room or: Call 911. Call the National Suicide Prevention Lifeline at 947-472-5826 or 988. This is open 24 hours a day. Text the Crisis Text Line at 657-070-3362. Summary Neuropathic pain is pain caused by damage to the nerves that are responsible for certain sensations in your body (sensory  nerves). Neuropathic pain may come and go as damaged nerves heal, or it may stay at the same level for years. Neuropathic pain is usually a long-term condition that can be difficult to treat. Consider joining a chronic pain support group. This information is not intended to replace advice given to you by your health care provider. Make sure you discuss any questions you have with your health care provider. Document Revised: 11/15/2020 Document Reviewed: 11/15/2020 Elsevier Patient Education  2024 [employer].

## 2023-09-27 NOTE — Progress Notes (Signed)
 Subjective:    Patient ID: Sheri Russell, female    DOB: 10-May-1945, 78 y.o.   MRN: [id number]   Chief Complaint:   Leg Pain   Fall Pertinent negatives include no abdominal pain or headaches.    Patient was last seen on 09/11/23 with pain in both legs. Pain was rated 9/10 and neurontin  was not helping. She was recently dx with cancer metastasis and was refusing treatment. She was given a toradol  and depomedrol injection as well as prescription for ultram  Today pain is worse. Still rates 10/10. Pain is from left hip all the way down  to foot. 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  Eyes:  Negative for pain.  Respiratory:  Negative for shortness of breath.   Cardiovascular:  Negative for chest pain, palpitations and leg swelling.  Gastrointestinal:  Negative for abdominal pain.  Endocrine: Negative for polydipsia.  Skin:  Negative for rash.  Neurological:  Negative for dizziness, weakness and headaches.  Hematological:  Does not bruise/bleed easily.  All other systems reviewed and are negative.      Objective:   Physical Exam Constitutional:      Appearance: Normal appearance.   Cardiovascular:     Rate and Rhythm: Normal rate and regular rhythm.     Heart sounds: Normal heart sounds.  Pulmonary:     Effort: Pulmonary effort is normal.     Breath sounds: Normal breath sounds.   Musculoskeletal:     Comments: (+) sLR bil   Skin:    General: Skin is  warm.   Neurological:     General: No focal deficit present.     Mental Status: She is alert and oriented to person, place, and time.   Psychiatric:        Mood and Affect: Mood normal.        Behavior: Behavior normal.    BP 135/73   Pulse 100   Temp 98.3 F (36.8 C) (Temporal)   Ht 5' 5 (1.651 m)   Wt 134 lb (60.8 kg)   LMP 07/09/1975   SpO2 96%   BMI 22.30 kg/m         Assessment & Plan:   Aliz Meritt in today with chief complaint of Leg Pain, Leg Swelling, and Fall   1. Bilateral sciatica (Primary) Moist heat Rest Keep follow up with oncology May address hospice in near future. - HYDROcodone -acetaminophen (NORCO/VICODIN) 5-325 MG tablet; Take 1 tablet by mouth every 6 (six) hours as needed for moderate pain (pain score 4-6).  Dispense: 60 tablet; Refill: 0 - methocarbamol (ROBAXIN) 500 MG tablet; Take 1 tablet (500 mg total) by mouth 4 (four) times daily.  Dispense: 60 tablet; Refill: 0    The above assessment and management plan was discussed with the patient. The patient verbalized understanding of and has agreed to the management plan. Patient is aware to call the clinic if symptoms persist or worsen. Patient is aware when to return to the clinic  for a follow-up visit. Patient educated on when it is appropriate to go to the emergency department.   Sheri Gladis, FNP

## 2023-10-02 ENCOUNTER — Ambulatory Visit: Admitting: Nurse Practitioner

## 2023-10-09 ENCOUNTER — Other Ambulatory Visit: Payer: Self-pay | Admitting: *Deleted

## 2023-10-09 ENCOUNTER — Encounter: Payer: Self-pay | Admitting: *Deleted

## 2023-10-09 NOTE — Patient Instructions (Signed)
 Visit Information  Thank you for taking time to visit with me today. Please don't hesitate to contact me if I can be of assistance to you before our next scheduled appointment.  Our next appointment is no further scheduled appointments.   Closing From:  Complex Care Management Care Management case will be closed. Patient has been provided contact information should new needs arise. : Patient declined further outreach at this time.  Please call the care guide team at [phone] if you need to cancel or reschedule your appointment.   Following is a copy of your care plan:   Goals Addressed   None     Please call the Suicide and Crisis Lifeline: 988 call the [place]  National Suicide Prevention Lifeline: (684)178-3820 or TTY: 762-851-5924 TTY (434) 362-0977) to talk to a trained counselor call 1-800-273-TALK (toll free, 24 hour hotline) call the [facility]: 782-427-0397 call 911 if you are experiencing a Mental Health or Behavioral Health Crisis or need someone to talk to.  Patient verbalizes understanding of instructions and care plan provided today and agrees to view in MyChart. Active MyChart status and patient understanding of how to access instructions and care plan via MyChart confirmed with patient.     Rosina Forte, BSN RN [facility], [facility] RN Care Manager Direct Dial: [phone]  Fax: [phone]

## 2023-10-09 NOTE — Patient Outreach (Signed)
 Complex Care Management   Visit Note  10/09/2023  Name:  Sheri Russell MRN: [id number] DOB: 03-05-1946  Situation: Referral received for Complex Care Management related to Kidney/Bladder Cancer I obtained verbal consent from Patient.  Visit completed with patient  on the phone  Background:   Past Medical History:  Diagnosis Date   Allergic rhinitis    Anxiety    Asthma    Bladder cancer (HCC) 1986   Cancer (HCC) 1971   pelvic - oncologist -Dr. Marzetta Barrett [facility] )   Cervical cancer [facility])    Colon polyps    Hyperlipidemia    Hypertension     Assessment: Patient Reported Symptoms:  Cognitive Cognitive Status: No symptoms reported Cognitive/Intellectual Conditions Management [RPT]: None reported or documented in medical history or problem list   Health Maintenance Behaviors: Annual physical exam, Social activities Healing Pattern: Average Health Facilitated by: Stress management, Rest, Prayer/meditation, Pain control  Neurological Neurological Review of Symptoms: No symptoms reported Neurological Self-Management Outcome: 4 (good)  HEENT HEENT Symptoms Reported: No symptoms reported HEENT Management Strategies: Routine screening HEENT Self-Management Outcome: 4 (good)    Cardiovascular Cardiovascular Symptoms Reported: No symptoms reported Cardiovascular Management Strategies: Routine screening Cardiovascular Self-Management Outcome: 4 (good)  Respiratory Respiratory Symptoms Reported: No symptoms reported Respiratory Self-Management Outcome: 4 (good)  Endocrine Endocrine Symptoms Reported: No symptoms reported Is patient diabetic?: No Endocrine Self-Management Outcome: 4 (good)  Gastrointestinal Gastrointestinal Symptoms Reported: No symptoms reported Gastrointestinal Management Strategies: Colostomy    Genitourinary Genitourinary Symptoms Reported: No symptoms reported Genitourinary Management Strategies: Nephrostomy tube  Integumentary Integumentary Symptoms  Reported: No symptoms reported Skin Management Strategies: Routine screening Skin Self-Management Outcome: 4 (good)  Musculoskeletal Musculoskelatal Symptoms Reviewed: Joint pain Additional Musculoskeletal Details: right ankle joint pain Musculoskeletal Management Strategies: Routine screening, Medication therapy Musculoskeletal Self-Management Outcome: 4 (good) Falls in the past year?: Yes Number of falls in past year: 1 or less Was there an injury with Fall?: No Fall Risk Category Calculator: 1 Patient Fall Risk Level: Low Fall Risk Patient at Risk for Falls Due to: History of fall(s) Fall risk Follow up: Falls evaluation completed  Psychosocial Psychosocial Symptoms Reported: No symptoms reported Behavioral Management Strategies: Support system Behavioral Health Self-Management Outcome: 4 (good) Major Change/Loss/Stressor/Fears (CP): Denies Techniques to Cope with Loss/Stress/Change: Not applicable Quality of Family Relationships: helpful, involved, supportive Do you feel physically threatened by others?: No      10/09/2023   10:13 AM  Depression screen PHQ 2/9  Decreased Interest 0  Down, Depressed, Hopeless 0  PHQ - 2 Score 0    There were no vitals filed for this visit.  Medications Reviewed Today     Reviewed by Bertrum Rosina HERO, RN (Registered Nurse) on 10/09/23 at 1011  Med List Status: <None>   Medication Order Taking? Sig Documenting Provider Last Dose Status Informant  enalapril  (VASOTEC ) 20 MG tablet [id number] Yes Take 1 tablet (20 mg total) by mouth 2 (two) times daily. Gladis, Mary-Margaret, FNP  Active   Ensure Max Protein (ENSURE MAX PROTEIN) BERNICE [id number] Yes Take 330 mLs (11 oz total) by mouth daily. Gladis, Mary-Margaret, FNP  Active   gabapentin  (NEURONTIN ) 100 MG capsule [id number]  Take 1-2 capsules (100-200 mg total) by mouth 2 (two) times daily.  Patient not taking: Reported on 10/09/2023   Cathlene Marry Lenis, FNP  Consider Medication Status and  Discontinue   HYDROcodone -acetaminophen  (NORCO/VICODIN) 5-325 MG tablet [id number] Yes Take 1 tablet by mouth every 6 (six) hours as needed for moderate  pain (pain score 4-6). Gladis, Mary-Margaret, FNP  Active   ipratropium (ATROVENT) 0.06 % nasal spray [id number] Yes 2 sprays into each nostril Three (3) times a day. [provider]  Active   meclizine  (ANTIVERT ) 25 MG tablet [id number] Yes TAKE 1 TABLET BY MOUTH THREE TIMES DAILY AS NEEDED FOR WILHELMENIA Gladis, Mary-Margaret, FNP  Active   methocarbamol  (ROBAXIN ) 500 MG tablet [id number] Yes Take 1 tablet (500 mg total) by mouth 4 (four) times daily. Gladis, Mary-Margaret, FNP  Active   ondansetron  (ZOFRAN ) 4 MG tablet [id number] Yes TAKE 1 TABLET BY MOUTH EVERY 8 HOURS AS NEEDED FOR NAUSEA AND VOMITING Gladis, Mary-Margaret, FNP  Active   oxybutynin  (DITROPAN ) 5 MG tablet [id number] Yes Take 1 tablet (5 mg total) by mouth at bedtime. Gladis, Mary-Margaret, FNP  Active   traMADol  (ULTRAM ) 50 MG tablet [id number]  Take 1 tablet (50 mg total) by mouth 2 (two) times daily.  Patient not taking: Reported on 10/09/2023   Gladis Mustard, FNP  Consider Medication Status and Discontinue   VENTOLIN  HFA 108 (90 Base) MCG/ACT inhaler [id number] Yes INHALE 2 PUFFS BY MOUTH EVERY 6 HOURS AS NEEDED FOR WHEEZING AND FOR SHORTNESS OF SHERIDA Gladis, Mary-Margaret, FNP  Active             Recommendation:   Continue Current Plan of Care  Follow Up Plan:   Closing From:  Complex Care Management Care Management case will be closed. Patient has been provided contact information should new needs arise. : Patient declined further outreach at this time.  Rosina Forte, BSN RN [facility], [facility] RN Care Manager Direct Dial: [phone]  Fax: [phone]

## 2023-10-10 ENCOUNTER — Ambulatory Visit: Payer: Self-pay

## 2023-10-10 NOTE — Telephone Encounter (Signed)
 FYI Only or Action Required?: Action required by provider: request for appointment.  Patient was last seen in primary care on 09/27/2023 by Gladis Mustard, FNP.  Called Nurse Triage reporting Foot Pain.  Symptoms began several weeks ago.  Interventions attempted: Prescription medications: Norco.  Symptoms are: gradually worsening.  Triage Disposition: See Physician Within 24 Hours  Patient/caregiver understands and will follow disposition?: YesCopied from CRM [id number]. Topic: Clinical - Red Word Triage >> Oct 10, 2023  8:15 AM Powell HERO wrote: Red Word that prompted transfer to Nurse Triage: Having trouble walking, left foot, swollen having trouble walking on it. Giving her a lot of trouble. Thinking it could be gout. Wanting to know how to proceed. Reason for Disposition  [1] Swollen foot AND [2] no fever  (Exceptions: localized bump from bunions, calluses, insect bite, sting)  Answer Assessment - Initial Assessment Questions 1. ONSET: When did the pain start?      weeks 2. LOCATION: Where is the pain located?      Left foot  3. PAIN: How bad is the pain?    (Scale 1-10; or mild, moderate, severe)  - MILD (1-3): doesn't interfere with normal activities.   - MODERATE (4-7): interferes with normal activities (e.g., work or school) or awakens from sleep, limping.   - SEVERE (8-10): excruciating pain, unable to do any normal activities, unable to walk.      severe  5. CAUSE: What do you think is causing the foot pain?     Gout flare up 6. OTHER SYMPTOMS: Do you have any other symptoms? (e.g., leg pain, rash, fever, numbness)     nausea    Pt is at  out of town on a beach vacation. Pt is  on 3rd floor of hotel.  Left Foot is swollen. Hurts in big toe to ankle. Daughter Sheri Russell said 1+ edema. No pitting. No discoloration. Pt is hopping around due to not be able to put on weight on it. Sheri Russell said she has history of gout. Sheri Russell is asking if prednisone /gout medication can be  called in today. Sheri Russell feels they can't get her down 3 flights of stairs to get her to UC today. Virtual appt made for soonest appt on 7/10. Please call Sheri Russell if medication can be called in today. New pharmacy would need to be input.  Protocols used: Foot Pain-A-AH

## 2023-10-10 NOTE — Telephone Encounter (Signed)
 E2C2 scheduled appointment.

## 2023-10-11 ENCOUNTER — Telehealth (INDEPENDENT_AMBULATORY_CARE_PROVIDER_SITE_OTHER): Admitting: Nurse Practitioner

## 2023-10-11 ENCOUNTER — Encounter: Payer: Self-pay | Admitting: Nurse Practitioner

## 2023-10-11 DIAGNOSIS — M25572 Pain in left ankle and joints of left foot: Secondary | ICD-10-CM

## 2023-10-11 MED ORDER — PREDNISONE 10 MG (21) PO TBPK: ORAL_TABLET | ORAL | 0 refills | Status: DC

## 2023-10-11 NOTE — Patient Instructions (Signed)
Ankle Pain The ankle joint helps you stand on your leg and allows you to move around. Ankle pain can happen on either side or the back of the ankle. You may have pain in one ankle or both ankles. Ankle pain may be sharp and burning or dull and aching. There may be tenderness, stiffness, redness, or warmth around the ankle. Many things can cause ankle pain. These include an injury to the area and overuse of your ankle. Follow these instructions at home: Activity Rest your ankle as told by your health care provider. Avoid doing things that cause ankle pain. Do not use the injured limb to support your body weight until your provider says that you can. Use crutches as told by your provider. Ask your provider when it is safe to drive if you have a brace on your ankle. Do exercises as told by your provider. If you have a removable brace: Wear the brace as told by your provider. Remove it only as told by your provider. Check the skin around the brace every day. Tell your provider about any concerns. Loosen the brace if your toes tingle, become numb, or turn cold and blue. Keep the brace clean. If the brace is not waterproof: Do not let it get wet. Cover it with a watertight covering when you take a bath or shower. If you have an elastic bandage:  Remove it when you take a bath or a shower. Try not to move your ankle much. Wiggle your toes from time to time. This helps to prevent swelling. Adjust the bandage if it feels too tight. Loosen the bandage if your foot tingles, becomes numb, or turns cold and blue. Managing pain, stiffness, and swelling  If told, put ice on the painful area. If you have a removable brace or elastic bandage, remove it as told by your provider. Put ice in a plastic bag. Place a towel between your skin and the bag. Leave the ice on for 20 minutes, 2-3 times a day. If your skin turns bright red, remove the ice right away to prevent skin damage. The risk of damage is  higher if you cannot feel pain, heat, or cold. Move your toes often to reduce stiffness and swelling. Raise (elevate) your ankle above the level of your heart while you are sitting or lying down. General instructions Take over-the-counter and prescription medicines only as told by your provider. To help you and your provider, write down: How often you have ankle pain. Where the pain is. What the pain feels like. If you are told to wear a certain shoe or insole, make sure you wear it the right way and for as long as you are told. Contact a health care provider if: Your pain gets worse. Your pain does not get better with medicine. You have a fever or chills. You have more trouble walking. You have new symptoms. Your foot, leg, toes, or ankle tingles, becomes numb or swollen, or turns cold and blue. This information is not intended to replace advice given to you by your health care provider. Make sure you discuss any questions you have with your health care provider. Document Revised: 01/11/2022 Document Reviewed: 01/11/2022 Elsevier Patient Education  2024 [employer].

## 2023-10-11 NOTE — Progress Notes (Signed)
 Virtual Visit Consent   Sheri Russell, you are scheduled for a virtual visit with [name], FNP, a [facility] provider, today.     Just as with appointments in the office, your consent must be obtained to participate.  Your consent will be active for this visit and any virtual visit you may have with one of our providers in the next 365 days.     If you have a MyChart account, a copy of this consent can be sent to you electronically.  All virtual visits are billed to your insurance company just like a traditional visit in the office.    As this is a virtual visit, video technology does not allow for your provider to perform a traditional examination.  This may limit your provider's ability to fully assess your condition.  If your provider identifies any concerns that need to be evaluated in person or the need to arrange testing (such as labs, EKG, etc.), we will make arrangements to do so.     Although advances in technology are sophisticated, we cannot ensure that it will always work on either your end or our end.  If the connection with a video visit is poor, the visit may have to be switched to a telephone visit.  With either a video or telephone visit, we are not always able to ensure that we have a secure connection.     I need to obtain your verbal consent now.   Are you willing to proceed with your visit today? YES   Delcia Spitzley has provided verbal consent on 10/11/2023 for a virtual visit (video or telephone).   [name], FNP   Date: 10/11/2023 8:45 AM   Virtual Visit via Video Note   I, [name], connected with Cathlean Morones ([id number], [birth year]) on 10/11/23 at  9:00 AM EDT by a video-enabled telemedicine application and verified that I am speaking with the correct person using two identifiers.  Location: Patient: Virtual Visit Location Patient: Home Provider: Virtual Visit Location Provider: Mobile   I discussed the limitations of  evaluation and management by telemedicine and the availability of in person appointments. The patient expressed understanding and agreed to proceed.    History of Present Illness: Sheri Russell is a 78 y.o. who identifies as a female who was assigned female at birth, and is being seen today for foot pain.  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 She has decided to do no treatments. She is on ultram  BID for pain. She is c/o left foot pain. Rates pain 10/10. Cannot hardly stand on it or walk.  * [place] beach [place] in [place] [employer]     Review of Systems  Constitutional:  Negative for diaphoresis and weight loss.  Eyes:  Negative for blurred vision, double vision and pain.  Respiratory:  Negative for shortness of breath.   Cardiovascular:  Negative for chest pain, palpitations, orthopnea and leg swelling.  Gastrointestinal:  Negative for abdominal pain.  Skin:  Negative for rash.  Neurological:  Negative for dizziness, sensory change, loss of consciousness, weakness and headaches.  Endo/Heme/Allergies:  Negative for polydipsia. Does not bruise/bleed easily.  Psychiatric/Behavioral:  Negative for memory loss. The patient does not have insomnia.   All other systems reviewed and are negative.   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   Overactive bladder 01/20/2021   Allergic rhinitis 10/31/2013   Essential hypertension, benign 07/08/2012   Hyperlipidemia with target LDL less than 100 07/08/2012   Cancer of vagina (HCC) 01/28/2012    Allergies:  Allergies  Allergen Reactions   Fish Allergy    Iohexol     IVP Dye    Iodinated Contrast Media Rash   Other Hives and Rash    Seafood perch, red snapper  any seafood with iodine   Medications:  Current Outpatient Medications:    enalapril  (VASOTEC ) 20 MG tablet, Take 1 tablet (20 mg total) by mouth 2 (two) times daily., Disp: 180 tablet, Rfl: 1   Ensure Max Protein (ENSURE MAX PROTEIN) LIQD, Take 330 mLs (11 oz total) by mouth daily., Disp: 330 mL, Rfl: PRN   gabapentin  (NEURONTIN ) 100 MG capsule, Take 1-2 capsules (100-200 mg total) by mouth 2 (two) times daily. (Patient not taking: Reported on 10/09/2023), Disp: , Rfl:    HYDROcodone -acetaminophen  (NORCO/VICODIN) 5-325 MG tablet, Take 1 tablet by mouth every 6 (six) hours as needed for moderate pain (pain score 4-6)., Disp: 60 tablet, Rfl: 0   ipratropium (ATROVENT) 0.06 % nasal spray, 2 sprays into each nostril Three (3) times a day., Disp: , Rfl:    meclizine  (ANTIVERT ) 25 MG tablet, TAKE 1 TABLET BY MOUTH THREE TIMES DAILY AS NEEDED FOR DIZZINESS, Disp: 30 tablet, Rfl: 2   methocarbamol  (ROBAXIN ) 500 MG tablet, Take 1 tablet (500 mg total) by mouth 4 (four) times daily., Disp: 60 tablet, Rfl: 0   ondansetron  (ZOFRAN ) 4 MG tablet, TAKE 1 TABLET BY MOUTH EVERY 8 HOURS AS NEEDED FOR NAUSEA AND VOMITING, Disp: 20 tablet, Rfl: 2   oxybutynin  (DITROPAN ) 5 MG tablet, Take 1 tablet (5 mg total) by mouth at bedtime., Disp: 90 tablet, Rfl: 1   traMADol  (ULTRAM ) 50 MG tablet, Take 1 tablet (50 mg total) by mouth 2 (two) times daily. (Patient not taking: Reported on 10/09/2023), Disp: 60 tablet, Rfl: 2   VENTOLIN  HFA 108 (90 Base) MCG/ACT inhaler, INHALE 2 PUFFS BY MOUTH EVERY 6 HOURS AS NEEDED FOR WHEEZING AND FOR SHORTNESS OF BREATH, Disp: 18 g, Rfl: 0  Observations/Objective: Patient is well-developed, well-nourished in no acute distress.  Resting comfortably  at home.  Head is normocephalic, atraumatic.  No labored breathing.  Speech is clear and coherent with logical content.  Patient is alert and oriented at baseline.  Left ankle edematous  Assessment and Plan:  Glenetta Kiger in today with chief  complaint of ankle pain  1. Acute left ankle pain (Primary) Ice [facility] Meds ordered this encounter  Medications   predniSONE  (STERAPRED UNI-PAK 21 TAB) 10 MG (21) TBPK tablet    Sig: As directed x 6 days    Dispense:  21 tablet    Refill:  0    Supervising Provider:   MARYANNE CHEW A [[id number]]    Follow Up Instructions: I discussed the assessment and treatment plan with the patient. The patient was provided an opportunity to ask questions and all were answered. The patient agreed with the plan and demonstrated an understanding of the instructions.  A copy of instructions were sent to the patient via MyChart.  The patient was advised to call back or seek an in-person evaluation if the symptoms worsen or if the condition fails to improve as anticipated.  Time:  I spent  8 minutes with the patient via telehealth technology discussing the above problems/concerns.    [name], FNP

## 2023-10-15 ENCOUNTER — Other Ambulatory Visit: Payer: Self-pay | Admitting: Nurse Practitioner

## 2023-10-15 DIAGNOSIS — M5431 Sciatica, right side: Secondary | ICD-10-CM

## 2023-10-15 MED ORDER — HYDROCODONE-ACETAMINOPHEN 5-325 MG PO TABS: 1.0000 | ORAL_TABLET | Freq: Four times a day (QID) | ORAL | 0 refills | Status: DC | PRN

## 2023-10-15 NOTE — Telephone Encounter (Signed)
 09/27/23 60 tabs/0 RF (15 day)

## 2023-10-15 NOTE — Telephone Encounter (Unsigned)
 Copied from CRM [id number]. Topic: Clinical - Medication Refill >> Oct 15, 2023  8:30 AM Suzette B wrote: Medication:  HYDROcodone -acetaminophen  (NORCO/VICODIN) 5-325 MG tablet  Has the patient contacted their pharmacy? No: Patient stated the bottle had no refill on it  This is the patient's preferred pharmacy:  [id number] - [place], [place] - [address] [place] [address] Phone: [phone] Fax: [phone]    Is this the correct pharmacy for this prescription? Yes If no, delete pharmacy and type the correct one.   Has the prescription been filled recently? Yes  Is the patient out of the medication? Yes  Has the patient been seen for an appointment in the last year OR does the patient have an upcoming appointment? Yes  Can we respond through MyChart? Yes  Agent: Please be advised that Rx refills may take up to 3 business days. We ask that you follow-up with your pharmacy.

## 2023-10-16 ENCOUNTER — Ambulatory Visit: Admitting: Nurse Practitioner

## 2023-10-16 ENCOUNTER — Encounter: Payer: Self-pay | Admitting: Nurse Practitioner

## 2023-10-16 VITALS — BP 117/68 | HR 111 | Temp 98.0°F

## 2023-10-16 DIAGNOSIS — M5432 Sciatica, left side: Secondary | ICD-10-CM | POA: Diagnosis not present

## 2023-10-16 DIAGNOSIS — C7951 Secondary malignant neoplasm of bone: Secondary | ICD-10-CM

## 2023-10-16 DIAGNOSIS — M5431 Sciatica, right side: Secondary | ICD-10-CM | POA: Diagnosis not present

## 2023-10-16 MED ORDER — OXYCODONE HCL 5 MG PO TABS: 5.0000 mg | ORAL_TABLET | Freq: Two times a day (BID) | ORAL | 0 refills | Status: DC

## 2023-10-16 MED ORDER — KETOROLAC TROMETHAMINE 60 MG/2ML IM SOLN
60.0000 mg | Freq: Once | INTRAMUSCULAR | Status: AC
  Administered 2023-10-16: 60 mg via INTRAMUSCULAR

## 2023-10-16 NOTE — Progress Notes (Signed)
   Subjective:    Patient ID: Sheri Russell, female    DOB: Dec 17, 1945, 78 y.o.   MRN: [id number]   Chief Complaint: Can't walk good (In a lot of pain/)   HPI  Patient comes in c/o trouble walking, she has gradually increased to constant pain since her dx of cancer metastases. She says it has spread to her bones. She rates her pain 10/10. She has been on hydrocodone  and was refilled yesterday for 60 tablets. She has tried taking 2 hydrocodones with no relief at all. Needs spomething for break through pain. She has palliative care coming to see her next week. Does not feel that sheis ready for hospice quite yet.  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  Eyes:  Negative for pain.  Respiratory:  Negative for shortness of breath.   Cardiovascular:  Negative for chest pain, palpitations and leg swelling.  Gastrointestinal:  Negative for abdominal pain.  Endocrine: Negative for polydipsia.  Skin:  Negative for rash.  Neurological:  Negative for dizziness, weakness and headaches.  Hematological:  Does not bruise/bleed easily.  All other systems reviewed and are negative.      Objective:   Physical Exam Constitutional:      Appearance: Normal appearance.  Musculoskeletal:     Comments: Grimacing in pain during visit. Rubbing both legs while sitting  Skin:    General: Skin is warm.  Neurological:     General: No focal deficit present.     Mental Status: She is alert  and oriented to person, place, and time.  Psychiatric:        Mood and Affect: Mood normal.     BP 117/68   Pulse (!) 111   Temp 98 F (36.7 C) (Temporal)   LMP 07/09/1975   SpO2 97%        Assessment & Plan:  Sheri Russell in today with chief complaint of Can't walk good (In a lot of pain/)   1. Bilateral sciatica Use roxicodone  only for break through pain Moist heat Rest RTO prn - oxyCODONE  (ROXICODONE ) 5 MG immediate release tablet; Take 1 tablet (5 mg total) by mouth in the morning and at bedtime.  Dispense: 60 tablet; Refill: 0  2. Malignant neoplasm metastatic to bone (HCC) (Primary) See above - oxyCODONE  (ROXICODONE ) 5 MG immediate release tablet; Take 1 tablet (5 mg total) by mouth in the morning and at bedtime.  Dispense: 60 tablet; Refill: 0 - ketorolac  (TORADOL ) injection 60 mg    The above assessment and management plan was discussed with the patient. The patient verbalized understanding of and has agreed to the management plan. Patient is aware to call the clinic if symptoms persist or worsen. Patient is aware when to return to the clinic for a follow-up visit. Patient educated on when it is appropriate to go to the emergency department.   [name], FNP

## 2023-10-17 ENCOUNTER — Emergency Department (HOSPITAL_COMMUNITY)

## 2023-10-17 ENCOUNTER — Emergency Department (HOSPITAL_COMMUNITY)
Admission: EM | Admit: 2023-10-17 | Discharge: 2023-10-18 | Disposition: A | Discharge status: Other Acute Inpatient Hospital | Attending: Emergency Medicine | Admitting: Emergency Medicine

## 2023-10-17 DIAGNOSIS — N179 Acute kidney failure, unspecified: Secondary | ICD-10-CM | POA: Diagnosis not present

## 2023-10-17 DIAGNOSIS — I82409 Acute embolism and thrombosis of unspecified deep veins of unspecified lower extremity: Secondary | ICD-10-CM | POA: Insufficient documentation

## 2023-10-17 DIAGNOSIS — N261 Atrophy of kidney (terminal): Secondary | ICD-10-CM | POA: Diagnosis not present

## 2023-10-17 DIAGNOSIS — M7989 Other specified soft tissue disorders: Secondary | ICD-10-CM | POA: Insufficient documentation

## 2023-10-17 DIAGNOSIS — I82452 Acute embolism and thrombosis of left peroneal vein: Secondary | ICD-10-CM | POA: Diagnosis not present

## 2023-10-17 DIAGNOSIS — R319 Hematuria, unspecified: Secondary | ICD-10-CM | POA: Insufficient documentation

## 2023-10-17 DIAGNOSIS — Z8551 Personal history of malignant neoplasm of bladder: Secondary | ICD-10-CM | POA: Insufficient documentation

## 2023-10-17 DIAGNOSIS — M79605 Pain in left leg: Secondary | ICD-10-CM | POA: Insufficient documentation

## 2023-10-17 DIAGNOSIS — I10 Essential (primary) hypertension: Secondary | ICD-10-CM | POA: Insufficient documentation

## 2023-10-17 DIAGNOSIS — E871 Hypo-osmolality and hyponatremia: Secondary | ICD-10-CM | POA: Diagnosis not present

## 2023-10-17 DIAGNOSIS — C7951 Secondary malignant neoplasm of bone: Secondary | ICD-10-CM | POA: Diagnosis not present

## 2023-10-17 DIAGNOSIS — C7800 Secondary malignant neoplasm of unspecified lung: Secondary | ICD-10-CM | POA: Diagnosis not present

## 2023-10-17 DIAGNOSIS — C679 Malignant neoplasm of bladder, unspecified: Secondary | ICD-10-CM | POA: Insufficient documentation

## 2023-10-17 DIAGNOSIS — R000 Tachycardia, unspecified: Secondary | ICD-10-CM | POA: Diagnosis not present

## 2023-10-17 DIAGNOSIS — G894 Chronic pain syndrome: Secondary | ICD-10-CM | POA: Diagnosis not present

## 2023-10-17 DIAGNOSIS — N1330 Unspecified hydronephrosis: Secondary | ICD-10-CM | POA: Diagnosis not present

## 2023-10-17 DIAGNOSIS — K828 Other specified diseases of gallbladder: Secondary | ICD-10-CM | POA: Diagnosis not present

## 2023-10-17 DIAGNOSIS — I959 Hypotension, unspecified: Secondary | ICD-10-CM | POA: Diagnosis not present

## 2023-10-17 HISTORY — DX: Essential (primary) hypertension: I10

## 2023-10-17 HISTORY — DX: Malignant (primary) neoplasm, unspecified: C80.1

## 2023-10-17 HISTORY — DX: Disorder of kidney and ureter, unspecified: N28.9

## 2023-10-17 MED ORDER — HYDROMORPHONE HCL 1 MG/ML IJ SOLN
1.0000 mg | Freq: Once | INTRAMUSCULAR | Status: AC
  Administered 2023-10-18: 1 mg via INTRAVENOUS
  Filled 2023-10-17: qty 1

## 2023-10-17 MED ORDER — PANTOPRAZOLE SODIUM 40 MG PO TBEC
40.0000 mg | DELAYED_RELEASE_TABLET | Freq: Two times a day (BID) | ORAL | Status: DC
  Administered 2023-10-17: 40 mg via ORAL
  Filled 2023-10-17: qty 1

## 2023-10-17 MED ORDER — HYDROMORPHONE HCL 1 MG/ML IJ SOLN
0.5000 mg | Freq: Once | INTRAMUSCULAR | Status: AC
  Administered 2023-10-17: 0.5 mg via INTRAVENOUS
  Filled 2023-10-17: qty 0.5

## 2023-10-17 MED ORDER — DEXAMETHASONE SODIUM PHOSPHATE 10 MG/ML IJ SOLN
10.0000 mg | Freq: Once | INTRAMUSCULAR | Status: AC
  Administered 2023-10-17: 10 mg via INTRAVENOUS
  Filled 2023-10-17: qty 1

## 2023-10-17 MED ORDER — SODIUM CHLORIDE 0.9 % IV BOLUS
1000.0000 mL | Freq: Once | INTRAVENOUS | Status: AC
  Administered 2023-10-17: 1000 mL via INTRAVENOUS

## 2023-10-17 MED ORDER — HYDROMORPHONE HCL 1 MG/ML IJ SOLN
1.0000 mg | Freq: Once | INTRAMUSCULAR | Status: AC
  Administered 2023-10-17: 1 mg via INTRAVENOUS
  Filled 2023-10-17: qty 1

## 2023-10-17 MED ORDER — HEPARIN BOLUS VIA INFUSION
3500.0000 [IU] | Freq: Once | INTRAVENOUS | Status: AC
  Administered 2023-10-17: 3500 [IU] via INTRAVENOUS

## 2023-10-17 MED ORDER — APIXABAN 5 MG PO TABS: 5.0000 mg | ORAL_TABLET | Freq: Two times a day (BID) | ORAL | Status: DC

## 2023-10-17 MED ORDER — APIXABAN 5 MG PO TABS: 10.0000 mg | ORAL_TABLET | Freq: Two times a day (BID) | ORAL | Status: DC

## 2023-10-17 MED ORDER — HEPARIN (PORCINE) 25000 UT/250ML-% IV SOLN
950.0000 [IU]/h | INTRAVENOUS | Status: DC
  Administered 2023-10-17: 950 [IU]/h via INTRAVENOUS
  Filled 2023-10-17: qty 250

## 2023-10-18 ENCOUNTER — Encounter: Payer: Self-pay | Admitting: Nurse Practitioner

## 2023-10-18 DIAGNOSIS — R000 Tachycardia, unspecified: Secondary | ICD-10-CM | POA: Diagnosis not present

## 2023-10-18 DIAGNOSIS — C679 Malignant neoplasm of bladder, unspecified: Secondary | ICD-10-CM | POA: Diagnosis not present

## 2023-10-18 DIAGNOSIS — N179 Acute kidney failure, unspecified: Secondary | ICD-10-CM | POA: Diagnosis not present

## 2023-10-18 DIAGNOSIS — C801 Malignant (primary) neoplasm, unspecified: Secondary | ICD-10-CM | POA: Diagnosis not present

## 2023-10-18 DIAGNOSIS — Z515 Encounter for palliative care: Secondary | ICD-10-CM | POA: Diagnosis not present

## 2023-10-18 DIAGNOSIS — Z933 Colostomy status: Secondary | ICD-10-CM | POA: Diagnosis not present

## 2023-10-18 DIAGNOSIS — N1330 Unspecified hydronephrosis: Secondary | ICD-10-CM | POA: Diagnosis not present

## 2023-10-18 DIAGNOSIS — E871 Hypo-osmolality and hyponatremia: Secondary | ICD-10-CM | POA: Diagnosis not present

## 2023-10-18 DIAGNOSIS — C7800 Secondary malignant neoplasm of unspecified lung: Secondary | ICD-10-CM | POA: Diagnosis not present

## 2023-10-18 DIAGNOSIS — G893 Neoplasm related pain (acute) (chronic): Secondary | ICD-10-CM | POA: Diagnosis not present

## 2023-10-18 DIAGNOSIS — M79605 Pain in left leg: Secondary | ICD-10-CM | POA: Diagnosis not present

## 2023-10-18 DIAGNOSIS — N261 Atrophy of kidney (terminal): Secondary | ICD-10-CM | POA: Diagnosis not present

## 2023-10-18 DIAGNOSIS — C7951 Secondary malignant neoplasm of bone: Secondary | ICD-10-CM | POA: Diagnosis not present

## 2023-10-18 DIAGNOSIS — N189 Chronic kidney disease, unspecified: Secondary | ICD-10-CM | POA: Diagnosis not present

## 2023-10-18 DIAGNOSIS — Z8551 Personal history of malignant neoplasm of bladder: Secondary | ICD-10-CM | POA: Diagnosis not present

## 2023-10-18 DIAGNOSIS — N823 Fistula of vagina to large intestine: Secondary | ICD-10-CM | POA: Diagnosis not present

## 2023-10-18 DIAGNOSIS — I82452 Acute embolism and thrombosis of left peroneal vein: Secondary | ICD-10-CM | POA: Diagnosis not present

## 2023-10-18 DIAGNOSIS — R52 Pain, unspecified: Secondary | ICD-10-CM | POA: Diagnosis not present

## 2023-10-18 DIAGNOSIS — M7989 Other specified soft tissue disorders: Secondary | ICD-10-CM | POA: Diagnosis not present

## 2023-10-18 DIAGNOSIS — I10 Essential (primary) hypertension: Secondary | ICD-10-CM | POA: Diagnosis not present

## 2023-10-18 DIAGNOSIS — I82A12 Acute embolism and thrombosis of left axillary vein: Secondary | ICD-10-CM | POA: Diagnosis not present

## 2023-10-18 LAB — URINALYSIS, ROUTINE W REFLEX MICROSCOPIC
Bilirubin Urine: NEGATIVE
Glucose, UA: NEGATIVE mg/dL
Ketones, ur: NEGATIVE mg/dL
Nitrite: NEGATIVE
Protein, ur: 100 mg/dL — AB
Specific Gravity, Urine: 1.008 (ref 1.005–1.030)
pH: 7 (ref 5.0–8.0)

## 2023-10-18 MED ORDER — HYDRALAZINE HCL 20 MG/ML IJ SOLN: 5.0000 mg | INTRAMUSCULAR | Status: DC | PRN

## 2023-10-18 MED ORDER — OXYCODONE HCL 5 MG PO TABS: 5.0000 mg | ORAL_TABLET | ORAL | Status: DC | PRN

## 2023-10-18 MED ORDER — SENNA 8.6 MG PO TABS: 1.0000 | ORAL_TABLET | Freq: Every day | ORAL | Status: DC

## 2023-10-18 MED ORDER — HYDROMORPHONE HCL 1 MG/ML IJ SOLN
1.0000 mg | Freq: Once | INTRAMUSCULAR | Status: AC
  Administered 2023-10-18: 1 mg via INTRAVENOUS
  Filled 2023-10-18: qty 1

## 2023-10-18 NOTE — ED Notes (Signed)
[facility] present to transport pt

## 2023-10-19 DIAGNOSIS — C7801 Secondary malignant neoplasm of right lung: Secondary | ICD-10-CM | POA: Diagnosis not present

## 2023-10-19 DIAGNOSIS — Z515 Encounter for palliative care: Secondary | ICD-10-CM | POA: Diagnosis not present

## 2023-10-19 DIAGNOSIS — I82622 Acute embolism and thrombosis of deep veins of left upper extremity: Secondary | ICD-10-CM | POA: Diagnosis not present

## 2023-10-19 DIAGNOSIS — N139 Obstructive and reflux uropathy, unspecified: Secondary | ICD-10-CM | POA: Diagnosis not present

## 2023-10-19 DIAGNOSIS — Z66 Do not resuscitate: Secondary | ICD-10-CM | POA: Diagnosis not present

## 2023-10-19 DIAGNOSIS — Z91041 Radiographic dye allergy status: Secondary | ICD-10-CM | POA: Diagnosis not present

## 2023-10-19 DIAGNOSIS — Z79899 Other long term (current) drug therapy: Secondary | ICD-10-CM | POA: Diagnosis not present

## 2023-10-19 DIAGNOSIS — N179 Acute kidney failure, unspecified: Secondary | ICD-10-CM | POA: Diagnosis not present

## 2023-10-19 DIAGNOSIS — C679 Malignant neoplasm of bladder, unspecified: Secondary | ICD-10-CM | POA: Diagnosis not present

## 2023-10-19 DIAGNOSIS — N3000 Acute cystitis without hematuria: Secondary | ICD-10-CM | POA: Diagnosis not present

## 2023-10-19 DIAGNOSIS — D72829 Elevated white blood cell count, unspecified: Secondary | ICD-10-CM | POA: Diagnosis not present

## 2023-10-19 DIAGNOSIS — G893 Neoplasm related pain (acute) (chronic): Secondary | ICD-10-CM | POA: Diagnosis not present

## 2023-10-19 DIAGNOSIS — Z91013 Allergy to seafood: Secondary | ICD-10-CM | POA: Diagnosis not present

## 2023-10-19 DIAGNOSIS — D631 Anemia in chronic kidney disease: Secondary | ICD-10-CM | POA: Diagnosis not present

## 2023-10-19 DIAGNOSIS — Z933 Colostomy status: Secondary | ICD-10-CM | POA: Diagnosis not present

## 2023-10-19 DIAGNOSIS — N1330 Unspecified hydronephrosis: Secondary | ICD-10-CM | POA: Diagnosis not present

## 2023-10-19 DIAGNOSIS — E871 Hypo-osmolality and hyponatremia: Secondary | ICD-10-CM | POA: Diagnosis not present

## 2023-10-19 DIAGNOSIS — I82452 Acute embolism and thrombosis of left peroneal vein: Secondary | ICD-10-CM | POA: Diagnosis not present

## 2023-10-19 DIAGNOSIS — I129 Hypertensive chronic kidney disease with stage 1 through stage 4 chronic kidney disease, or unspecified chronic kidney disease: Secondary | ICD-10-CM | POA: Diagnosis not present

## 2023-10-19 DIAGNOSIS — N1832 Chronic kidney disease, stage 3b: Secondary | ICD-10-CM | POA: Diagnosis not present

## 2023-10-19 DIAGNOSIS — Z90710 Acquired absence of both cervix and uterus: Secondary | ICD-10-CM | POA: Diagnosis not present

## 2023-10-19 DIAGNOSIS — J45909 Unspecified asthma, uncomplicated: Secondary | ICD-10-CM | POA: Diagnosis not present

## 2023-10-19 DIAGNOSIS — C7951 Secondary malignant neoplasm of bone: Secondary | ICD-10-CM | POA: Diagnosis not present

## 2023-10-19 DIAGNOSIS — C7802 Secondary malignant neoplasm of left lung: Secondary | ICD-10-CM | POA: Diagnosis not present

## 2023-11-08 ENCOUNTER — Ambulatory Visit: Admitting: Nurse Practitioner

## 2023-12-03 DEATH — deceased

## 2024-02-19 ENCOUNTER — Ambulatory Visit: Admitting: Nurse Practitioner

## 2024-08-18 ENCOUNTER — Ambulatory Visit
